# Patient Record
Sex: Male | Born: 1945 | Race: White | Hispanic: No | Marital: Married | Smoking: Former smoker
Health system: Southern US, Community
[De-identification: ages and names within clinical notes are randomized; demographics above are authoritative.]

## PROBLEM LIST (undated history)

## (undated) DIAGNOSIS — E785 Hyperlipidemia, unspecified: Secondary | ICD-10-CM

## (undated) DIAGNOSIS — K219 Gastro-esophageal reflux disease without esophagitis: Secondary | ICD-10-CM

## (undated) DIAGNOSIS — I10 Essential (primary) hypertension: Secondary | ICD-10-CM

## (undated) DIAGNOSIS — G4730 Sleep apnea, unspecified: Secondary | ICD-10-CM

## (undated) DIAGNOSIS — Z9989 Dependence on other enabling machines and devices: Secondary | ICD-10-CM

## (undated) DIAGNOSIS — I38 Endocarditis, valve unspecified: Secondary | ICD-10-CM

## (undated) DIAGNOSIS — I639 Cerebral infarction, unspecified: Secondary | ICD-10-CM

## (undated) DIAGNOSIS — Z9289 Personal history of other medical treatment: Secondary | ICD-10-CM

## (undated) DIAGNOSIS — I330 Acute and subacute infective endocarditis: Secondary | ICD-10-CM

## (undated) DIAGNOSIS — G4733 Obstructive sleep apnea (adult) (pediatric): Secondary | ICD-10-CM

## (undated) DIAGNOSIS — R011 Cardiac murmur, unspecified: Secondary | ICD-10-CM

## (undated) HISTORY — DX: Obstructive sleep apnea (adult) (pediatric): G47.33

## (undated) HISTORY — PX: INGUINAL HERNIA REPAIR: SUR1180

## (undated) HISTORY — PX: COLONOSCOPY: SHX174

## (undated) HISTORY — DX: Sleep apnea, unspecified: G47.30

## (undated) HISTORY — DX: Cardiac murmur, unspecified: R01.1

## (undated) HISTORY — DX: Hyperlipidemia, unspecified: E78.5

## (undated) HISTORY — DX: Acute and subacute infective endocarditis: I33.0

## (undated) HISTORY — DX: Cerebral infarction, unspecified: I63.9

## (undated) HISTORY — DX: Obstructive sleep apnea (adult) (pediatric): Z99.89

## (undated) HISTORY — DX: Gastro-esophageal reflux disease without esophagitis: K21.9

## (undated) HISTORY — PX: UPPER GASTROINTESTINAL ENDOSCOPY: SHX188

## (undated) HISTORY — DX: Endocarditis, valve unspecified: I38

## (undated) HISTORY — PX: HERNIA REPAIR: SHX51

## (undated) HISTORY — DX: Personal history of other medical treatment: Z92.89

## (undated) HISTORY — PX: POLYPECTOMY: SHX149

## (undated) HISTORY — DX: Essential (primary) hypertension: I10

---

## 2000-07-30 ENCOUNTER — Ambulatory Visit (HOSPITAL_COMMUNITY): Admission: RE | Admit: 2000-07-30 | Discharge: 2000-07-30 | Payer: Self-pay | Admitting: *Deleted

## 2000-07-30 ENCOUNTER — Encounter: Payer: Self-pay | Admitting: *Deleted

## 2002-01-27 ENCOUNTER — Ambulatory Visit (HOSPITAL_COMMUNITY): Admission: RE | Admit: 2002-01-27 | Discharge: 2002-01-27 | Payer: Self-pay | Admitting: Family Medicine

## 2002-10-13 ENCOUNTER — Ambulatory Visit (HOSPITAL_COMMUNITY): Admission: RE | Admit: 2002-10-13 | Discharge: 2002-10-13 | Payer: Self-pay | Admitting: *Deleted

## 2002-10-13 ENCOUNTER — Encounter (INDEPENDENT_AMBULATORY_CARE_PROVIDER_SITE_OTHER): Payer: Self-pay | Admitting: *Deleted

## 2004-04-04 ENCOUNTER — Encounter (INDEPENDENT_AMBULATORY_CARE_PROVIDER_SITE_OTHER): Payer: Self-pay | Admitting: *Deleted

## 2004-04-04 ENCOUNTER — Encounter (INDEPENDENT_AMBULATORY_CARE_PROVIDER_SITE_OTHER): Payer: Self-pay | Admitting: Specialist

## 2004-04-04 ENCOUNTER — Ambulatory Visit (HOSPITAL_COMMUNITY): Admission: RE | Admit: 2004-04-04 | Discharge: 2004-04-04 | Payer: Self-pay | Admitting: *Deleted

## 2006-06-11 ENCOUNTER — Ambulatory Visit (HOSPITAL_COMMUNITY): Admission: RE | Admit: 2006-06-11 | Discharge: 2006-06-11 | Payer: Self-pay | Admitting: *Deleted

## 2006-06-11 ENCOUNTER — Encounter (INDEPENDENT_AMBULATORY_CARE_PROVIDER_SITE_OTHER): Payer: Self-pay | Admitting: Specialist

## 2006-06-11 ENCOUNTER — Encounter (INDEPENDENT_AMBULATORY_CARE_PROVIDER_SITE_OTHER): Payer: Self-pay | Admitting: *Deleted

## 2008-03-24 ENCOUNTER — Encounter (INDEPENDENT_AMBULATORY_CARE_PROVIDER_SITE_OTHER): Payer: Self-pay | Admitting: *Deleted

## 2008-03-24 ENCOUNTER — Ambulatory Visit (HOSPITAL_COMMUNITY): Admission: RE | Admit: 2008-03-24 | Discharge: 2008-03-24 | Payer: Self-pay | Admitting: *Deleted

## 2009-01-09 HISTORY — PX: CARDIAC CATHETERIZATION: SHX172

## 2009-01-31 ENCOUNTER — Inpatient Hospital Stay (HOSPITAL_COMMUNITY): Admission: EM | Admit: 2009-01-31 | Discharge: 2009-02-08 | Payer: Self-pay | Admitting: Neurology

## 2009-01-31 ENCOUNTER — Encounter: Payer: Self-pay | Admitting: Emergency Medicine

## 2009-01-31 ENCOUNTER — Ambulatory Visit: Payer: Self-pay | Admitting: Internal Medicine

## 2009-01-31 ENCOUNTER — Encounter: Payer: Self-pay | Admitting: Internal Medicine

## 2009-01-31 ENCOUNTER — Ambulatory Visit: Payer: Self-pay | Admitting: Thoracic Surgery (Cardiothoracic Vascular Surgery)

## 2009-02-01 ENCOUNTER — Encounter (INDEPENDENT_AMBULATORY_CARE_PROVIDER_SITE_OTHER): Payer: Self-pay | Admitting: Neurology

## 2009-02-02 ENCOUNTER — Ambulatory Visit: Payer: Self-pay | Admitting: Internal Medicine

## 2009-02-02 ENCOUNTER — Encounter (INDEPENDENT_AMBULATORY_CARE_PROVIDER_SITE_OTHER): Payer: Self-pay | Admitting: Neurology

## 2009-02-06 ENCOUNTER — Encounter: Payer: Self-pay | Admitting: Thoracic Surgery (Cardiothoracic Vascular Surgery)

## 2009-02-13 ENCOUNTER — Encounter: Payer: Self-pay | Admitting: Internal Medicine

## 2009-02-13 ENCOUNTER — Telehealth: Payer: Self-pay | Admitting: Internal Medicine

## 2009-02-15 ENCOUNTER — Ambulatory Visit: Payer: Self-pay | Admitting: Internal Medicine

## 2009-02-15 DIAGNOSIS — K219 Gastro-esophageal reflux disease without esophagitis: Secondary | ICD-10-CM

## 2009-02-15 DIAGNOSIS — I6340 Cerebral infarction due to embolism of unspecified cerebral artery: Secondary | ICD-10-CM

## 2009-02-15 DIAGNOSIS — Z9189 Other specified personal risk factors, not elsewhere classified: Secondary | ICD-10-CM | POA: Insufficient documentation

## 2009-02-15 DIAGNOSIS — D649 Anemia, unspecified: Secondary | ICD-10-CM

## 2009-02-15 DIAGNOSIS — I10 Essential (primary) hypertension: Secondary | ICD-10-CM

## 2009-02-15 DIAGNOSIS — I330 Acute and subacute infective endocarditis: Secondary | ICD-10-CM

## 2009-02-15 DIAGNOSIS — E785 Hyperlipidemia, unspecified: Secondary | ICD-10-CM | POA: Insufficient documentation

## 2009-02-17 ENCOUNTER — Telehealth (INDEPENDENT_AMBULATORY_CARE_PROVIDER_SITE_OTHER): Payer: Self-pay | Admitting: *Deleted

## 2009-02-22 ENCOUNTER — Ambulatory Visit: Payer: Self-pay | Admitting: Internal Medicine

## 2009-02-23 ENCOUNTER — Encounter: Payer: Self-pay | Admitting: Internal Medicine

## 2009-02-27 ENCOUNTER — Encounter: Payer: Self-pay | Admitting: Internal Medicine

## 2009-03-02 ENCOUNTER — Ambulatory Visit (HOSPITAL_COMMUNITY): Admission: RE | Admit: 2009-03-02 | Discharge: 2009-03-02 | Payer: Self-pay | Admitting: *Deleted

## 2009-03-02 ENCOUNTER — Encounter (INDEPENDENT_AMBULATORY_CARE_PROVIDER_SITE_OTHER): Payer: Self-pay | Admitting: *Deleted

## 2009-03-13 ENCOUNTER — Ambulatory Visit: Payer: Self-pay | Admitting: Thoracic Surgery (Cardiothoracic Vascular Surgery)

## 2009-03-13 ENCOUNTER — Encounter: Payer: Self-pay | Admitting: Internal Medicine

## 2009-03-14 ENCOUNTER — Ambulatory Visit: Payer: Self-pay | Admitting: Internal Medicine

## 2009-03-17 ENCOUNTER — Encounter: Admission: RE | Admit: 2009-03-17 | Discharge: 2009-03-17 | Payer: Self-pay | Admitting: Neurology

## 2009-03-28 ENCOUNTER — Encounter: Payer: Self-pay | Admitting: Internal Medicine

## 2009-04-04 ENCOUNTER — Ambulatory Visit: Payer: Self-pay | Admitting: Internal Medicine

## 2009-04-11 HISTORY — PX: MITRAL VALVE REPAIR: SHX2039

## 2009-04-13 ENCOUNTER — Encounter: Payer: Self-pay | Admitting: Internal Medicine

## 2009-04-18 ENCOUNTER — Ambulatory Visit: Payer: Self-pay | Admitting: Internal Medicine

## 2009-04-19 ENCOUNTER — Encounter: Payer: Self-pay | Admitting: Internal Medicine

## 2009-05-08 ENCOUNTER — Ambulatory Visit (HOSPITAL_COMMUNITY)
Admission: RE | Admit: 2009-05-08 | Discharge: 2009-05-08 | Payer: Self-pay | Admitting: Thoracic Surgery (Cardiothoracic Vascular Surgery)

## 2009-05-09 ENCOUNTER — Ambulatory Visit: Payer: Self-pay | Admitting: Internal Medicine

## 2009-05-10 ENCOUNTER — Encounter: Payer: Self-pay | Admitting: Thoracic Surgery (Cardiothoracic Vascular Surgery)

## 2009-05-10 ENCOUNTER — Inpatient Hospital Stay (HOSPITAL_COMMUNITY)
Admission: RE | Admit: 2009-05-10 | Discharge: 2009-05-16 | Payer: Self-pay | Admitting: Thoracic Surgery (Cardiothoracic Vascular Surgery)

## 2009-05-10 ENCOUNTER — Ambulatory Visit: Payer: Self-pay | Admitting: Thoracic Surgery (Cardiothoracic Vascular Surgery)

## 2009-06-01 ENCOUNTER — Ambulatory Visit: Payer: Self-pay | Admitting: Thoracic Surgery (Cardiothoracic Vascular Surgery)

## 2009-06-01 ENCOUNTER — Encounter: Payer: Self-pay | Admitting: Internal Medicine

## 2009-06-01 ENCOUNTER — Encounter
Admission: RE | Admit: 2009-06-01 | Discharge: 2009-06-01 | Payer: Self-pay | Admitting: Thoracic Surgery (Cardiothoracic Vascular Surgery)

## 2009-06-08 ENCOUNTER — Encounter (HOSPITAL_COMMUNITY): Admission: RE | Admit: 2009-06-08 | Discharge: 2009-09-06 | Payer: Self-pay | Admitting: Cardiology

## 2009-07-05 ENCOUNTER — Ambulatory Visit (HOSPITAL_BASED_OUTPATIENT_CLINIC_OR_DEPARTMENT_OTHER)
Admission: RE | Admit: 2009-07-05 | Discharge: 2009-07-05 | Payer: Self-pay | Admitting: Thoracic Surgery (Cardiothoracic Vascular Surgery)

## 2009-07-08 ENCOUNTER — Ambulatory Visit: Payer: Self-pay | Admitting: Internal Medicine

## 2009-08-01 ENCOUNTER — Ambulatory Visit (HOSPITAL_BASED_OUTPATIENT_CLINIC_OR_DEPARTMENT_OTHER)
Admission: RE | Admit: 2009-08-01 | Discharge: 2009-08-01 | Payer: Self-pay | Admitting: Thoracic Surgery (Cardiothoracic Vascular Surgery)

## 2009-08-05 ENCOUNTER — Ambulatory Visit: Payer: Self-pay | Admitting: Internal Medicine

## 2009-09-15 ENCOUNTER — Encounter: Payer: Self-pay | Admitting: Internal Medicine

## 2009-12-18 ENCOUNTER — Ambulatory Visit: Payer: Self-pay | Admitting: Thoracic Surgery (Cardiothoracic Vascular Surgery)

## 2009-12-18 ENCOUNTER — Encounter: Payer: Self-pay | Admitting: Internal Medicine

## 2010-02-19 ENCOUNTER — Encounter: Admission: RE | Admit: 2010-02-19 | Discharge: 2010-02-19 | Payer: Self-pay | Admitting: Surgery

## 2010-03-08 IMAGING — CT CT HEAD W/O CM
1 series · 16 of 30 positions shown, 20 images · non-contrast
Comparison: None available.

CLINICAL DATA: Headache and weakness.

CT HEAD WITHOUT CONTRAST
TECHNIQUE: Contiguous axial images were obtained from the base of
the skull through the vertex without contrast.

[Series 2: head_seq 4.5 h37s st · axial · 0.43mm/px · z∈[-156,-12]mm · 16 of 36 slices shown, 20 images]
[im 2/36  brain]
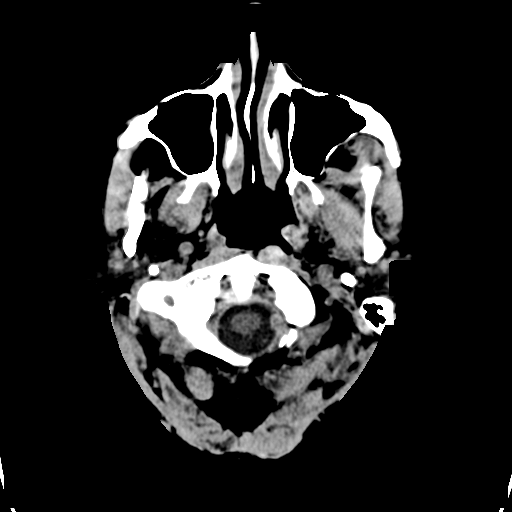
[im 2/36  bone]
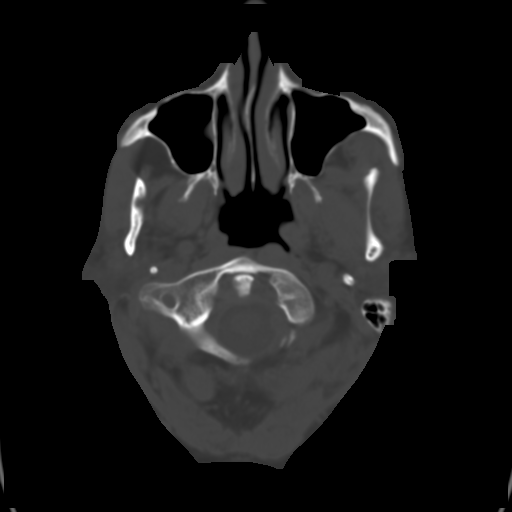
[im 4/36  brain]
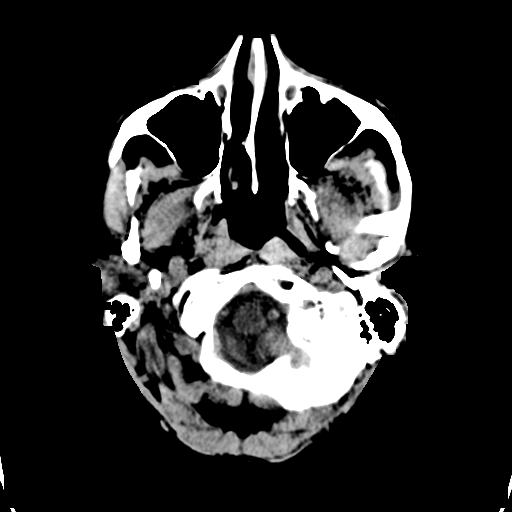
[im 7/36  brain]
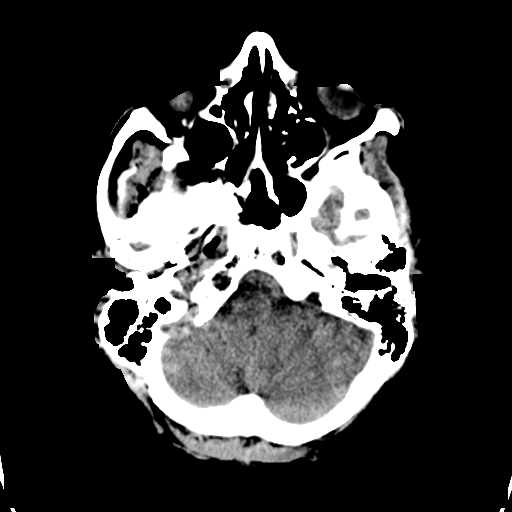
[im 9/36  brain]
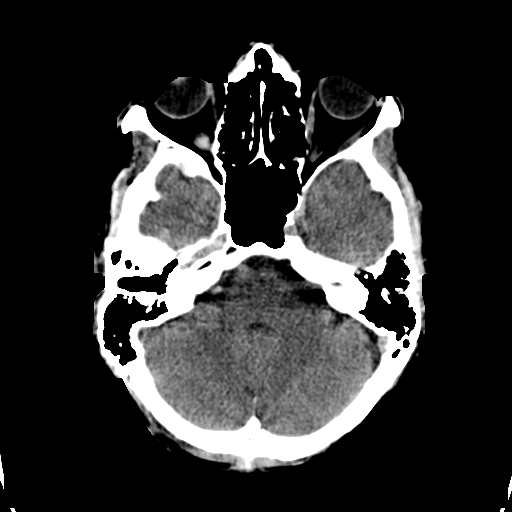
[im 10/36  brain]
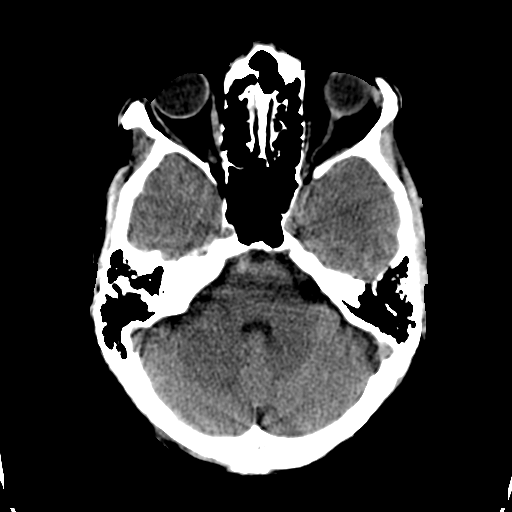
[im 10/36  bone]
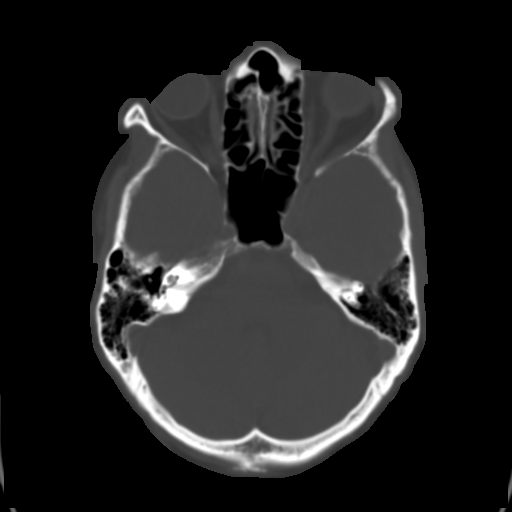
[im 13/36  brain]
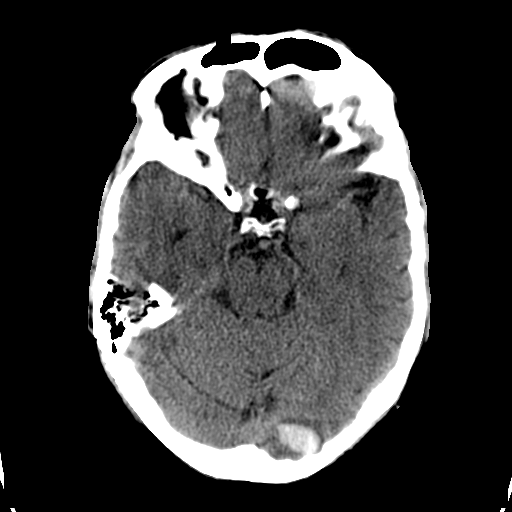
[im 15/36  brain]
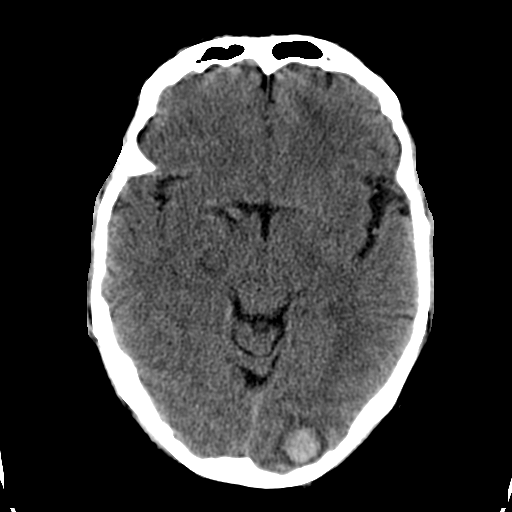
[im 17/36  brain]
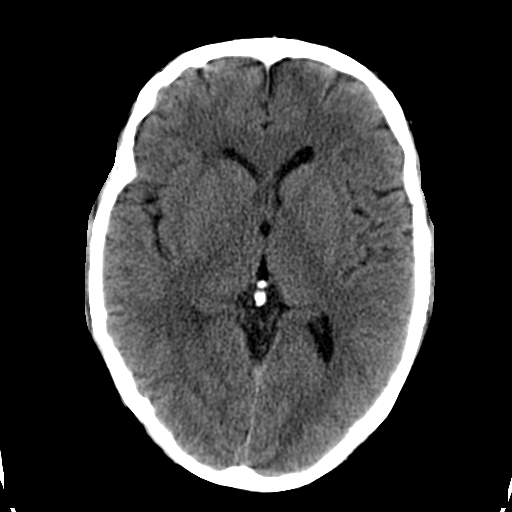
[im 19/36  brain]
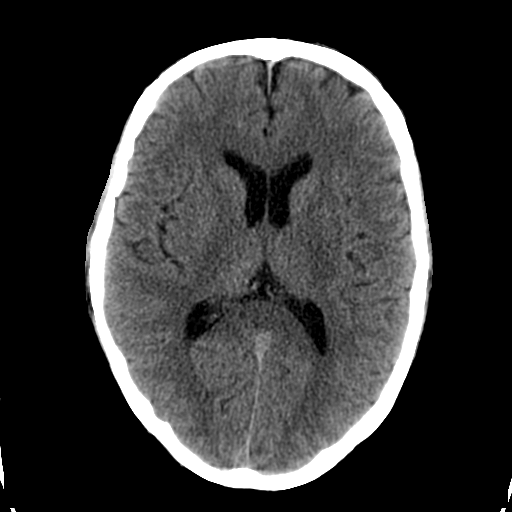
[im 19/36  bone]
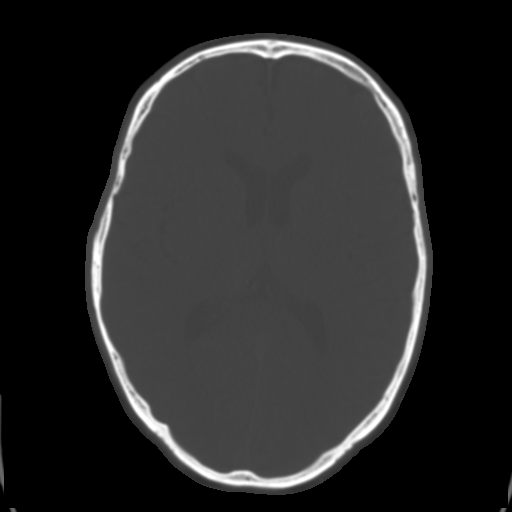
[im 21/36  brain]
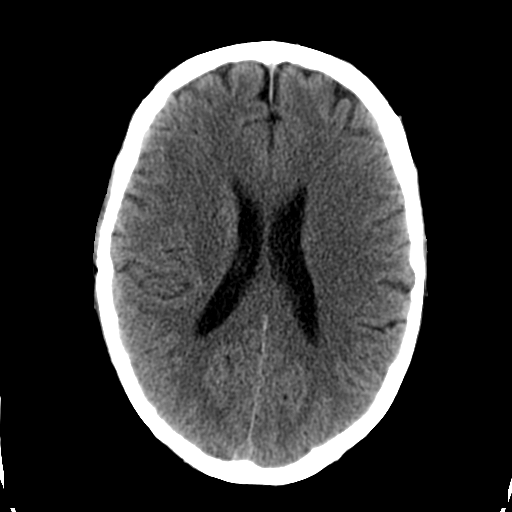
[im 23/36  brain]
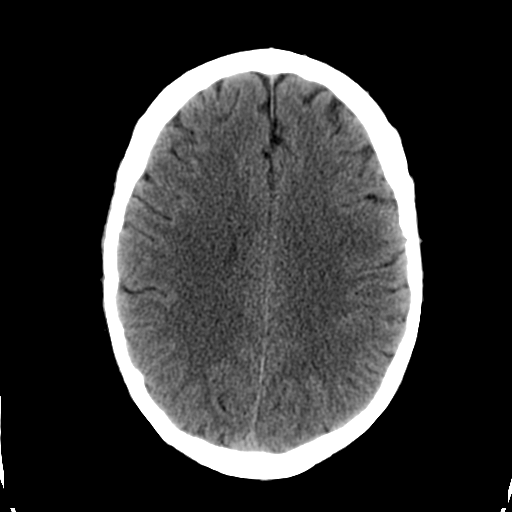
[im 26/36  brain]
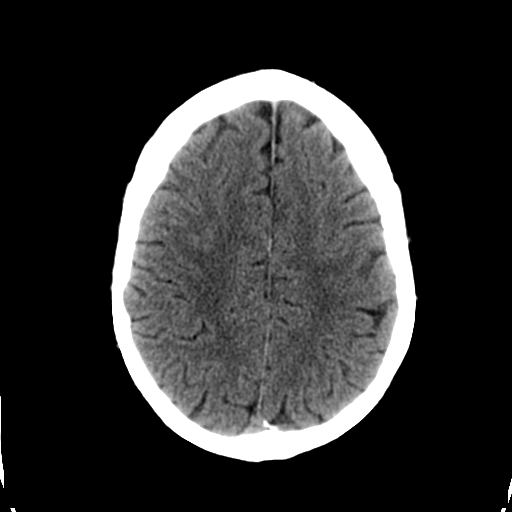
[im 27/36  brain]
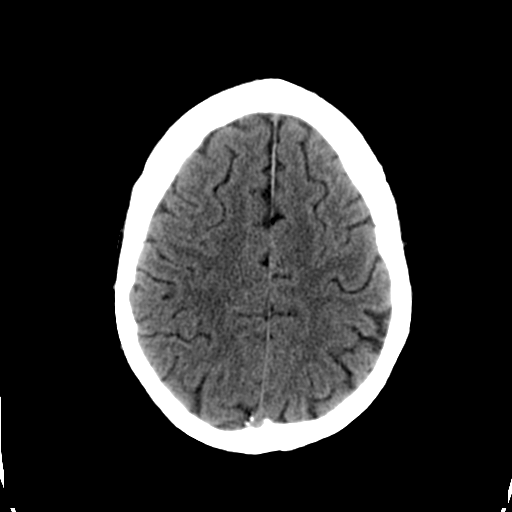
[im 27/36  bone]
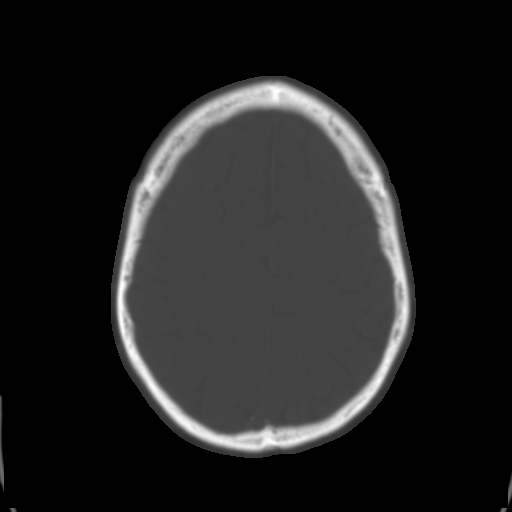
[im 29/36  brain]
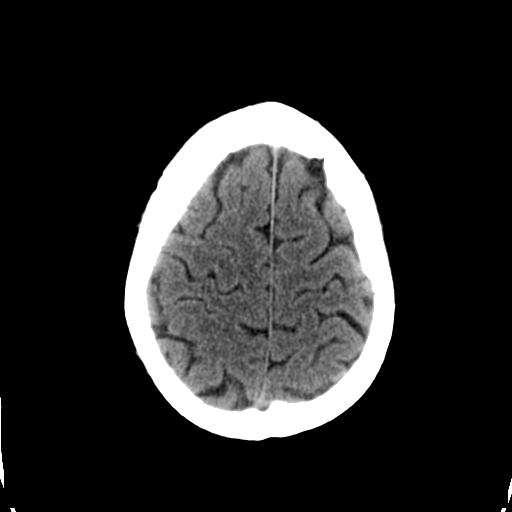
[im 32/36  brain]
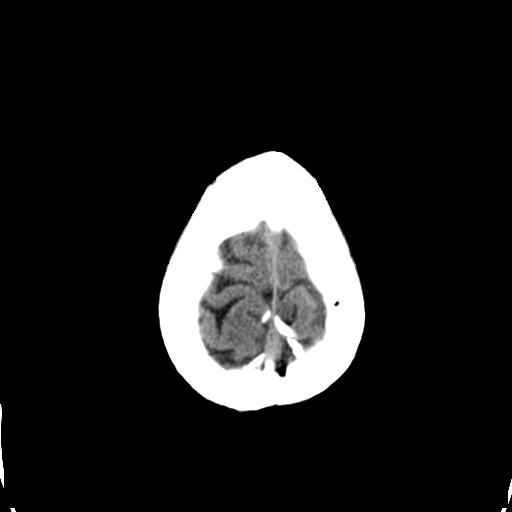
[im 34/36  brain]
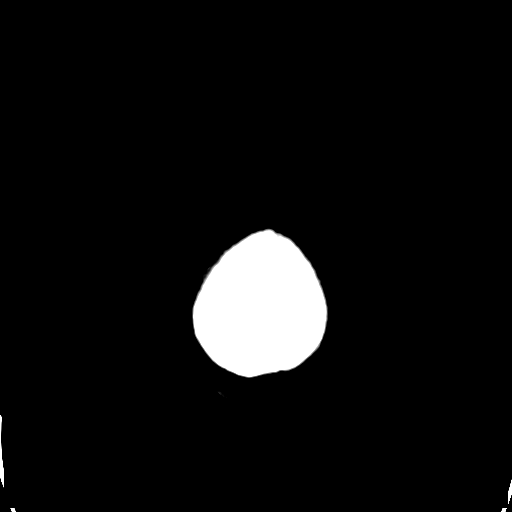

[16 of 30 positions shown; findings below may reference images not displayed]

FINDINGS: There is a focal, round hemorrhage in the posterior
aspect of the left parieto-occipital  lobe measuring 2.1 cm in
diameter.  The brain otherwise appears normal without extra-axial
fluid collection, midline shift or interventricular hemorrhage.
Calvarium is intact.  Imaged paranasal sinuses and mastoid air
cells are clear.
IMPRESSION: Focal hemorrhage in the left parieto-occipital lobe.  Etiology is
indeterminate.  Lesion may represent a hemorrhagic metastasis.

## 2010-03-09 IMAGING — CT CT HEAD W/O CM
1 of 2 series · 13 of 30 positions shown, 17 images · non-contrast
Comparison: the previous day's study

CLINICAL DATA: Intracranial hemorrhage

CT HEAD WITHOUT CONTRAST
TECHNIQUE: Contiguous axial images were obtained from the base of
the skull through the vertex without contrast.

[Series 2: brain · axial · 0.49mm/px · z∈[+143,+283]mm · 13 of 32 slices shown, 17 images]
[im 3/32  brain]
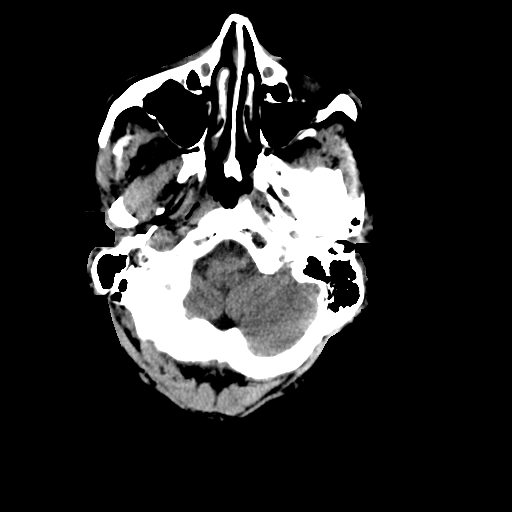
[im 3/32  bone]
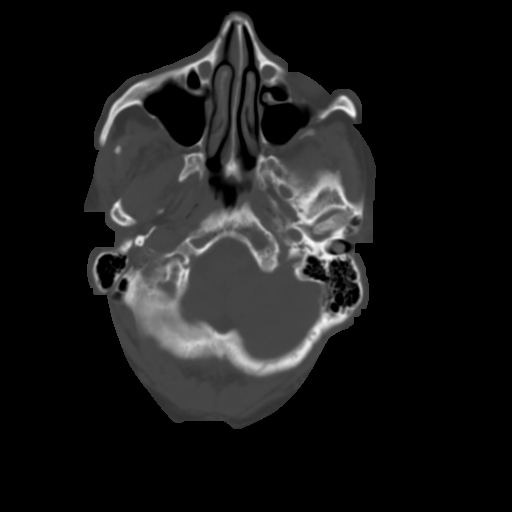
[im 5/32  brain]
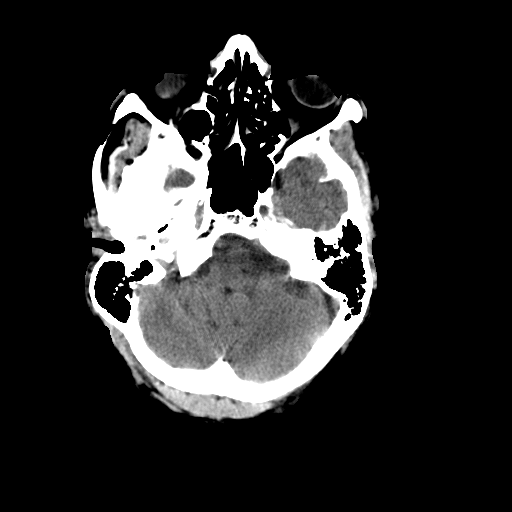
[im 7/32  brain]
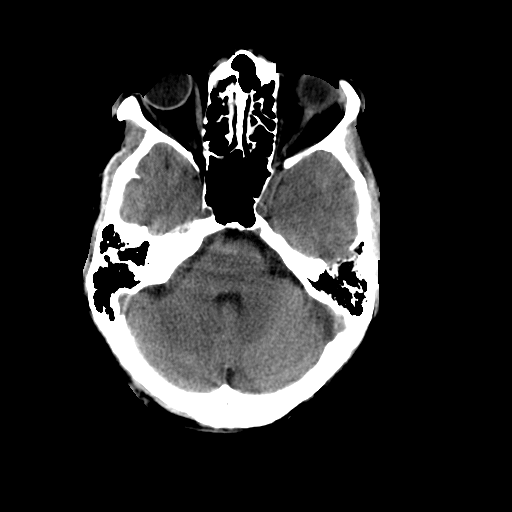
[im 9/32  brain]
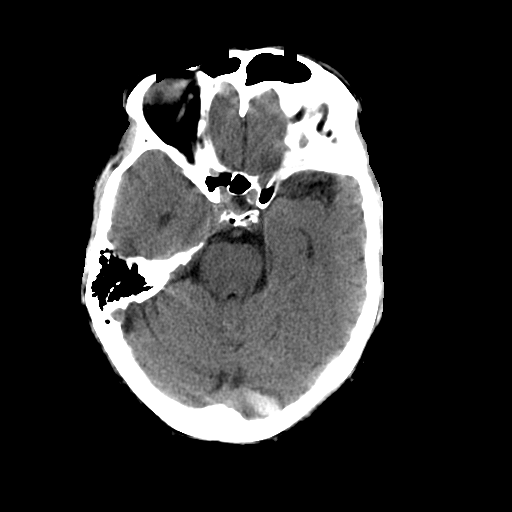
[im 12/32  brain]
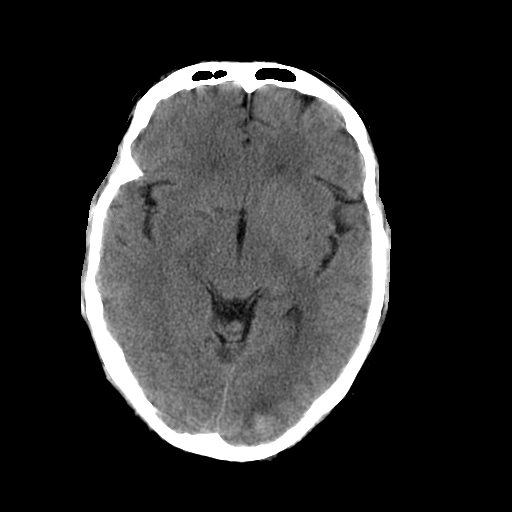
[im 12/32  bone]
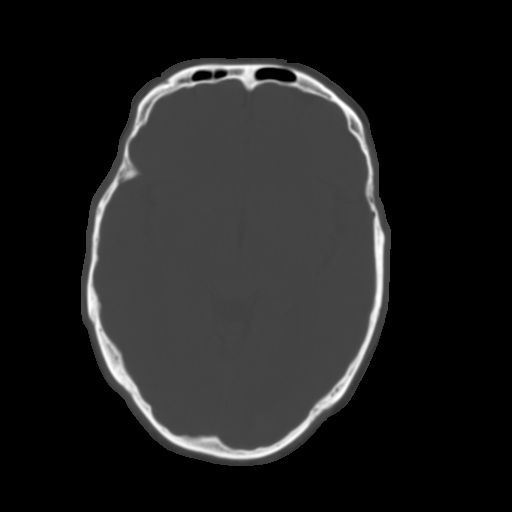
[im 14/32  brain]
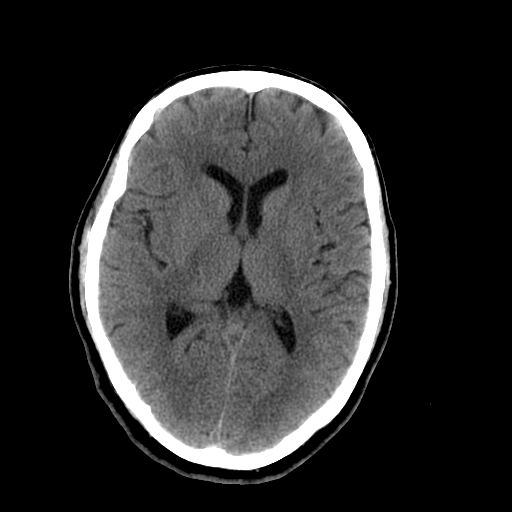
[im 16/32  brain]
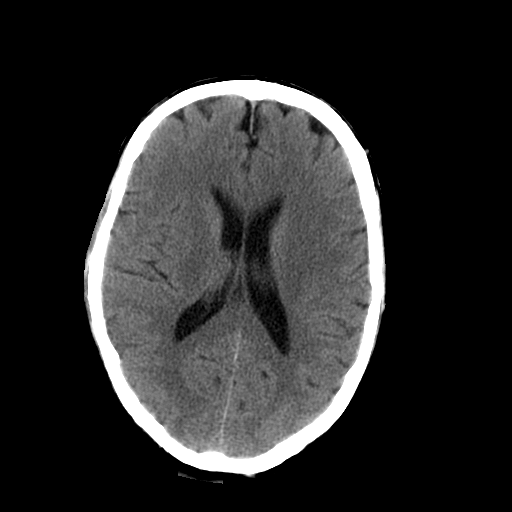
[im 18/32  brain]
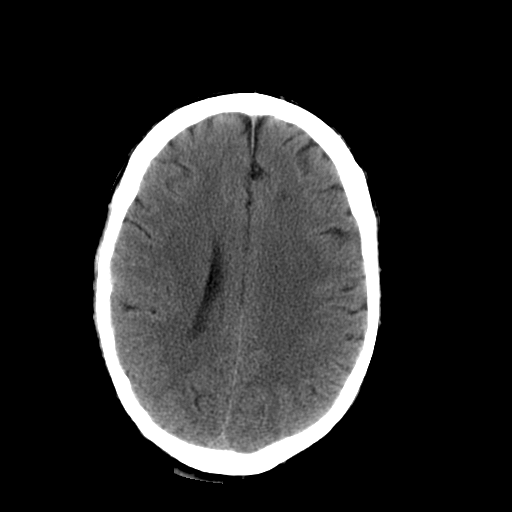
[im 20/32  brain]
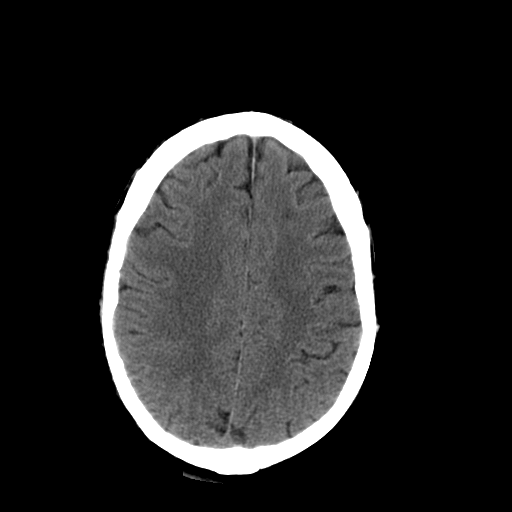
[im 20/32  bone]
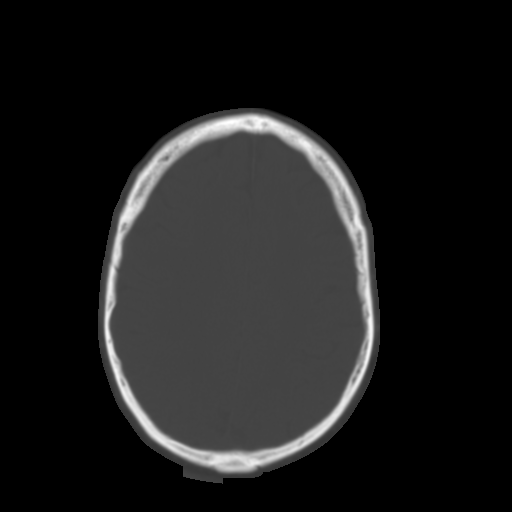
[im 23/32  brain]
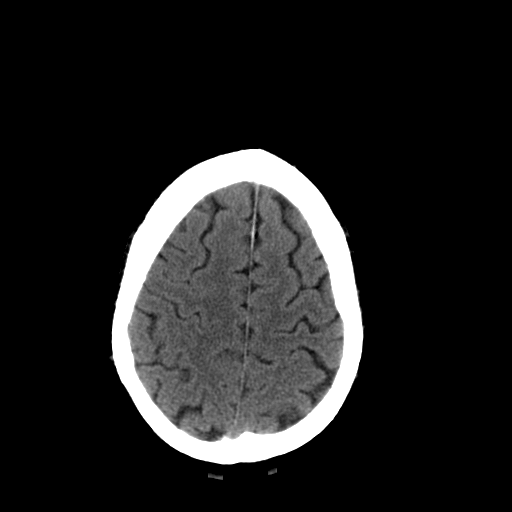
[im 25/32  brain]
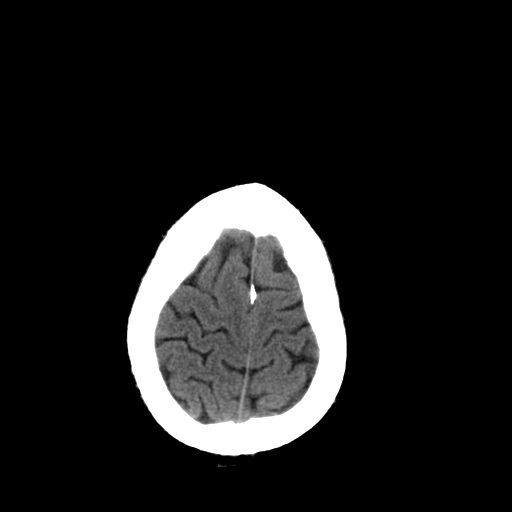
[im 27/32  brain]
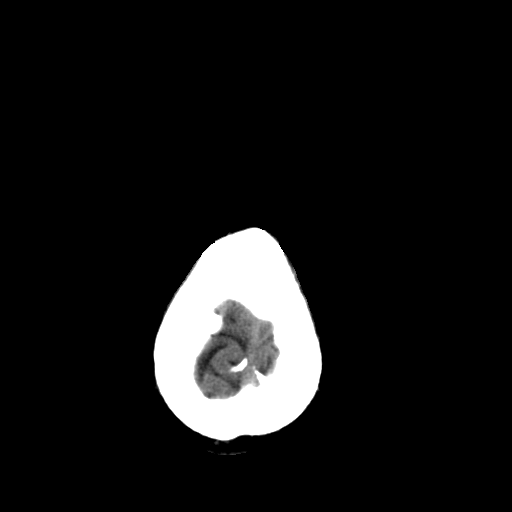
[im 29/32  brain]
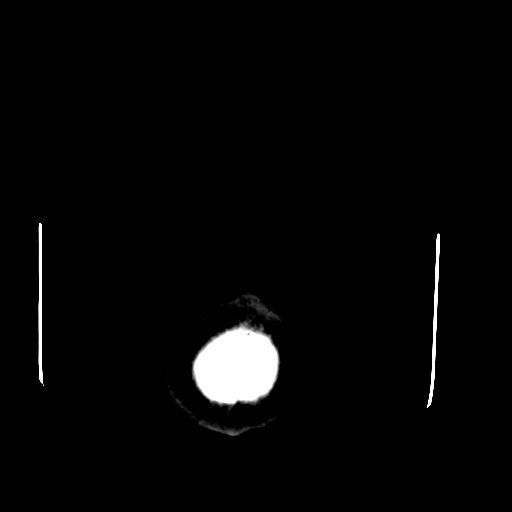
[im 29/32  bone]
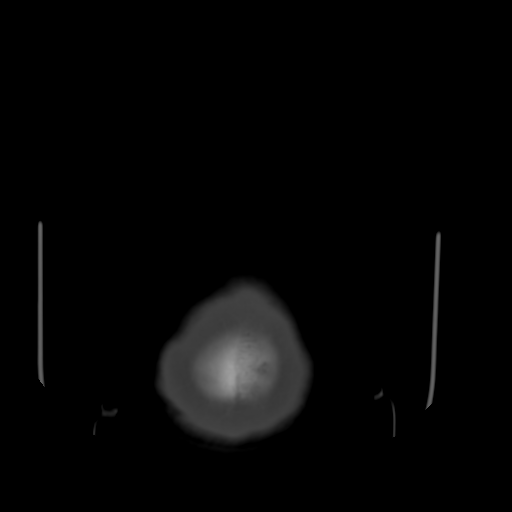

[13 of 30 positions shown; findings below may reference images not displayed]

FINDINGS: 15 mm left occipital focal intraparenchymal hemorrhage
measures slightly smaller than on prior study.  There is a rim of
surrounding parenchymal edema.  Minimal mass effect.  No new
intracranial hemorrhage.  Ventricles and sulci remain normal in
size and symmetry.  Negative for midline shift.  Acute infarct may
be inapparent on noncontrast CT.  Bone windows show no focal
lesion.
IMPRESSION: 1.  Slight interval decrease in size of focal left occipital
intraparenchymal hemorrhage, without acute or superimposed
abnormality.

## 2010-03-14 IMAGING — CR DG CHEST 1V PORT
1 series · 1 of 1 positions shown · non-contrast
Comparison: 01/31/2009

CLINICAL DATA: PICC line placement

PORTABLE CHEST - 1 VIEW - 9916 hours:

[view not recorded]
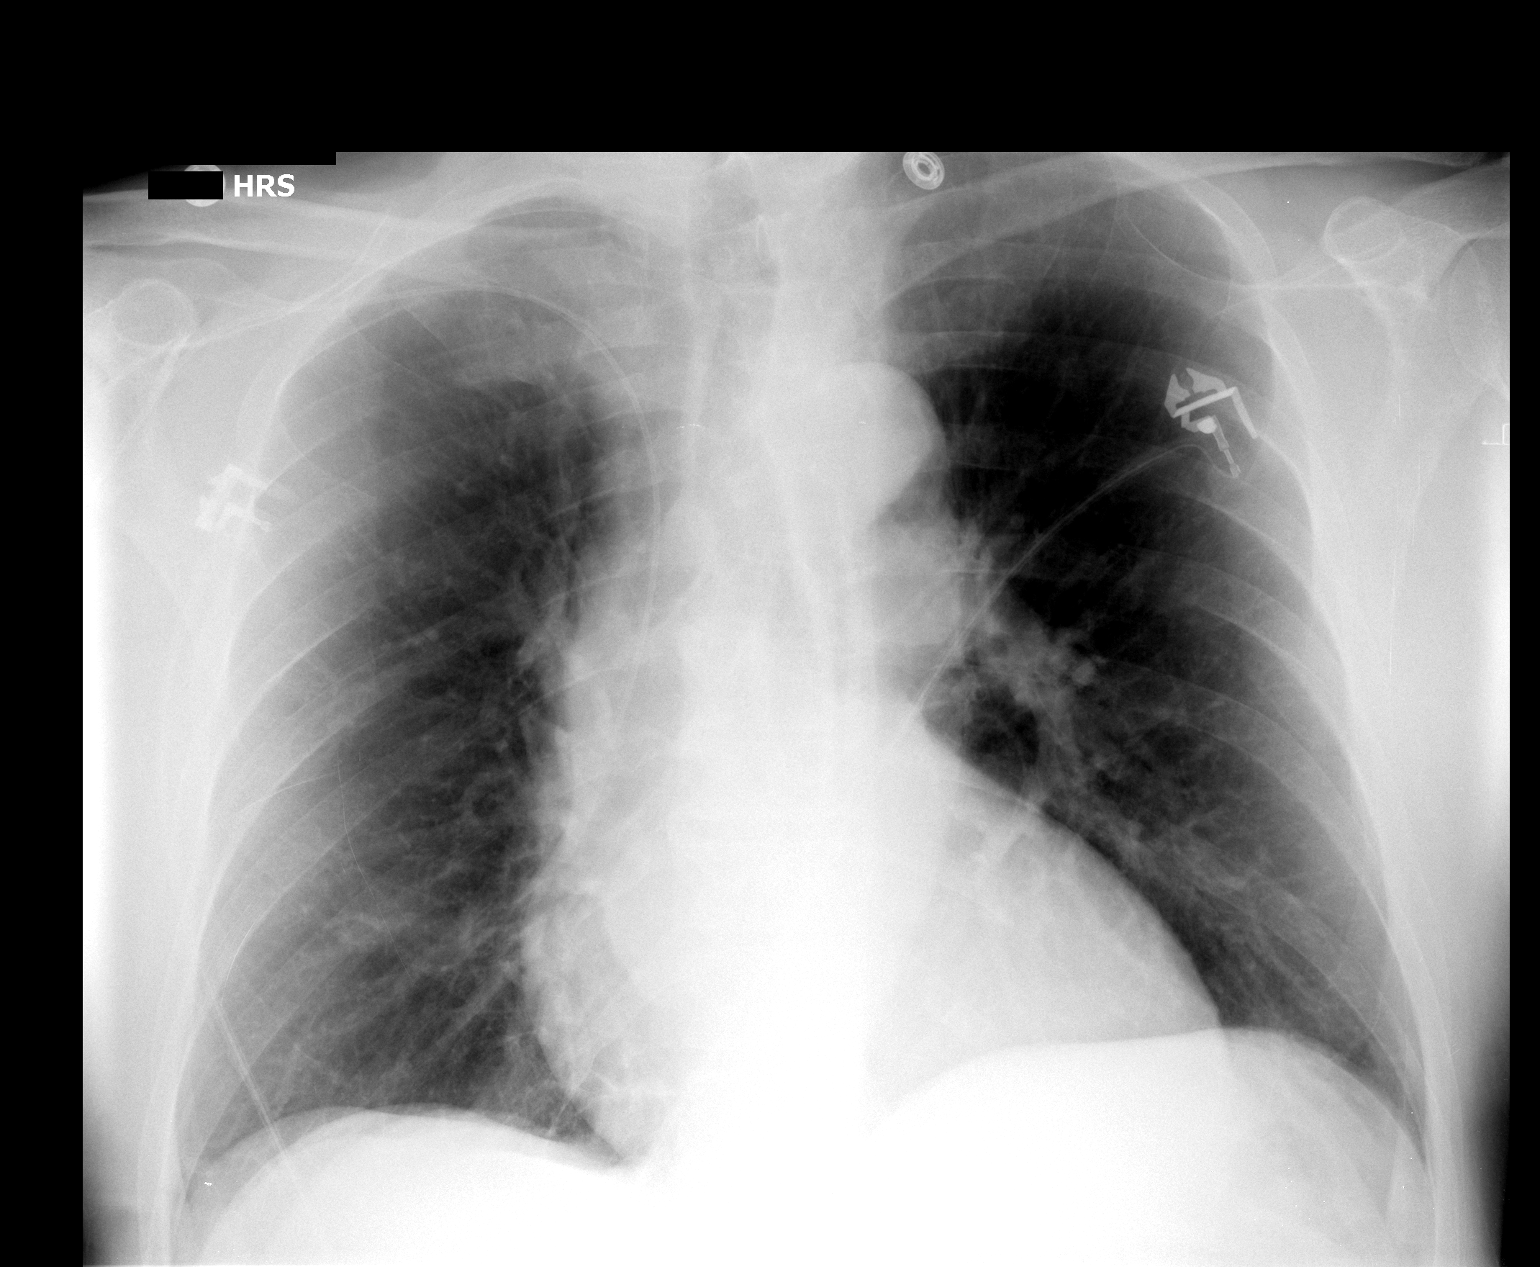

[1 of 1 positions shown; findings below may reference images not displayed]

FINDINGS: PICC line via right upper extremity approach is noted
with tip in the mid to lower SVC. Lungs clear.  Heart size normal.
IMPRESSION: Specifically, the PICC  is in the mid to lower SVC.

## 2010-09-04 ENCOUNTER — Encounter (INDEPENDENT_AMBULATORY_CARE_PROVIDER_SITE_OTHER): Payer: Self-pay | Admitting: *Deleted

## 2010-10-16 ENCOUNTER — Ambulatory Visit: Payer: Self-pay | Admitting: Gastroenterology

## 2010-11-22 DIAGNOSIS — Z9289 Personal history of other medical treatment: Secondary | ICD-10-CM

## 2010-11-22 HISTORY — DX: Personal history of other medical treatment: Z92.89

## 2010-12-11 NOTE — Procedures (Signed)
Summary: EGD  NAME:  Edgar Jones, Edgar Jones                 ACCOUNT NO.:  [id number]   MEDICAL RECORD NO.:  [id number]          PATIENT TYPE:  AMB   LOCATION:  ENDO                         FACILITY:  [facility]   PHYSICIAN:  Georgiana Spinner, M.D.    DATE OF BIRTH:  07-29-1946   DATE OF PROCEDURE:  06/11/2006  DATE OF DISCHARGE:                                 OPERATIVE REPORT   PROCEDURE:  Upper endoscopy with dilation and biopsy.   INDICATIONS:  Dysphagia.   ANESTHESIA:  Demerol 40 mg, Versed 5 mg.   PROCEDURE:  With the patient mildly sedated in the left lateral decubitus  position, the Olympus videoscopic endoscope was inserted and passed under  direct vision through the esophagus which appeared normal until we reached  distal esophagus and there was an area of Barrett's esophagus and a fairly  tight esophageal stricture that was photographed.  We then entered into the  stomach fundus, body, antrum, duodenal bulb, second portion duodenum were  visualized.  From this point the endoscope was slowly withdrawn taking  circumferential views of duodenal mucosa until the endoscope had been pulled  back into the stomach placed in retroflexion to view the stomach from below.  The endoscope was then straightened and a guidewire was passed. The  endoscope was withdrawn.  Subsequently Savary dilators 15, 16 and 17 were  passed under fluoroscopic guidance rather easily.  With the latter, I  elected to withdraw the guidewire with the dilator and we then reinserted  the endoscope and it was noted that we had fractured the stricture as  planned.  We then took biopsies from the areas of what appeared to be  Barrett's esophagus and withdrew the endoscope.  The patient's vital signs,  pulse oximeter remained stable.  The patient tolerated procedure well  without apparent complications.   FINDINGS:  Barrett's esophagus with a esophageal stricture dilated, await  clinical response and biopsy reports.  The patient  will call me for results  and follow-up with me as an outpatient.           ______________________________  Georgiana Spinner, M.D.     GMO/MEDQ  D:  06/11/2006  T:  06/11/2006  Job:  [id number]

## 2010-12-11 NOTE — Procedures (Signed)
Summary: Colon  NAME:  Edgar Jones, Edgar Jones                 ACCOUNT NO.:  [id number]      MEDICAL RECORD NO.:  [id number]          PATIENT TYPE:  AMB      LOCATION:  ENDO                         FACILITY:  [facility]      PHYSICIAN:  Georgiana Spinner, M.D.    DATE OF BIRTH:  [birth year]      DATE OF PROCEDURE:   DATE OF DISCHARGE:                                  OPERATIVE REPORT      PROCEDURE:  Colonoscopy.      INDICATIONS:  Colon polyps.  Rule out colon cancer because of strep   bovis bacteremia.      ANESTHESIA:  Fentanyl 125 mcg, Versed 12 mg.      PROCEDURE:  With the patient mildly sedated in the left lateral   decubitus position, a rectal exam was performed which was unremarkable   to my examination.  Subsequently, the Pentax videoscopic pediatric   colonoscope was inserted in the rectum and passed through a very   tortuous rectosigmoid and sigmoid colon area with pressure applied and   the patient rolled to his back and subsequently to his right side, we   were able to reach the cecum identified by ileocecal valve and   appendiceal orifice, both of which were photographed.  One of the loops   of the ileocecal valve was prominent so I elected to biopsy it and from   this point, the colonoscope was then slowly withdrawn taking   circumferential views of colonic mucosa, stopping in the transverse   colon where a polyp was seen, photographed and removed using hot biopsy   forceps technique at setting of 20/150 blended current.  We next stopped   in the rectum which appeared normal on direct and showed hemorrhoids on   retroflexed view.  The endoscope was straightened and withdrawn.  The   patient's vital signs and pulse oximeter remained stable.  The patient   tolerated the procedure well without apparent complications.      FINDINGS:   1. Polyp of transverse colon.   2. Internal hemorrhoids.   3. Fullness of one of the valves of the ileocecal valve, which may be       normal variant.   Await biopsy reports.  The patient will call me       for results and follow-up with me as an outpatient.                  ______________________________   Georgiana Spinner, M.D.            GMO/MEDQ  D:  03/02/2009  T:  03/02/2009  Job:  [id number]      cc:   Cliffton Asters, M.D.   Fax: [phone]

## 2010-12-11 NOTE — Consult Note (Signed)
Summary: [facility]  [facility]   Imported By: Florinda Marker 01/30/2010 15:54:57  _____________________________________________________________________  External Attachment:    Type:   Image     Comment:   External Document

## 2010-12-11 NOTE — Procedures (Signed)
Summary: EGD  NAME:  Edgar Jones, Edgar Jones                 ACCOUNT NO.:  [id number]      MEDICAL RECORD NO.:  [id number]          PATIENT TYPE:  AMB      LOCATION:  ENDO                         [facility]ACILITY:  [facility]      PHYSICIAN:  Georgiana Spinner, M.D.    DATE OF BIRTH:  10/23/1946      DATE OF PROCEDURE:  03/24/2008   DATE OF DISCHARGE:                                  OPERATIVE REPORT      PROCEDURE:  Upper endoscopy.      INDICATIONS:  GERD.      ANESTHESIA:  Fentanyl 75 mcg, Versed 6 mg.      PROCEDURE:  With the patient mildly sedated in the left lateral   decubitus position, the Pentax videoscopic endoscope was inserted in the   mouth, passed under direct vision through the esophagus which appeared   normal.  There may have been one small area of Barrett's I could see,   but the area was somewhat edematous so I could not get the   squamocolumnar junction to open in plain view.  Biopsy was taken of the   areas of the suspected Barrett's.  We entered into the stomach.  The   fundus, body, antrum, duodenal bulb, second portion duodenum all   appeared normal.  From this point the endoscope was slowly withdrawn   taking circumferential views of duodenal mucosa until the endoscope had   been pulled back into the stomach, placed on retroflexion to view the   stomach from below.  The endoscope was straightened and withdrawn taking   circumferential views of remaining gastric and esophageal mucosa.  The   patient's vital signs, pulse oximeter remained stable.  The patient   tolerated procedure well without apparent complications.      FINDINGS:  Loose wrap of the GE junction around the endoscope indicating   laxity of the lower esophageal sphincter, question of Barrett's   esophagus, not clearly seen at this point, await biopsy report.  The   patient will call me for results and follow-up with me as an outpatient.   Proceed to colonoscopy.                  ______________________________   Georgiana Spinner, M.D.            GMO/MEDQ  D:  03/24/2008  T:  03/24/2008  Job:  [id number]      cc:   Marjory Lies, M.D.   Fax: [phone]

## 2010-12-11 NOTE — Procedures (Signed)
Summary: EGD  NAME:  Edgar Jones, Edgar Jones                           ACCOUNT NO.:  [id number]   MEDICAL RECORD NO.:  [id number]                   PATIENT TYPE:  AMB   LOCATION:  ENDO                                 FACILITY:  [facility]   PHYSICIAN:  Georgiana Spinner, M.D.                 DATE OF BIRTH:  [birth year]   DATE OF PROCEDURE:  04/04/2004  DATE OF DISCHARGE:                                 OPERATIVE REPORT   PROCEDURE:  Upper endoscopy with biopsy.   INDICATIONS FOR PROCEDURE:  Gastroesophageal reflux disease.   ANESTHESIA:  Demerol 40, Versed 4 mg.   DESCRIPTION OF PROCEDURE:  With the patient mildly sedated in the left  lateral decubitus position, the Olympus videoscopic endoscope was inserted  in the mouth and passed under direct vision through the esophagus which  appeared normal until it reached the distal esophagus and there was the  beginning of a recurrence of a stricture that we had previously dilated. We  advanced slightly pass this and there was two areas of Barrett's esophagus  seen, one isthmus of tissue on the distal wall, a small island of Barrett's  on the proximal wall of the esophagus.  A photograph was taken and biopsies  of each area were taken.  We entered into the stomach.  The fundus, body,  antrum, duodenal bulb and second portion of the duodenum were visualized  which appeared normal.  From this point, the endoscope was slowly withdrawn  taking circumferential views of the duodenal mucosa until the endoscope was  then pulled back in the stomach, placed in retroflexion to view the stomach  from below. The endoscope was then straightened and withdrawn taking  circumferential views of the remaining gastric and esophageal mucosa  stopping in the mid esophagus where a papilloma was seen and removed with  biopsy forceps.  The endoscope was then withdrawn.  The patient's vital  signs and pulse oximeter remained stable. The patient tolerated the  procedure well without  apparent complications.   FINDINGS:  Early recurrence of stricture of the distal esophagus. Changes of  Barrett's esophagus biopsied. 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   PLAN:  Await biopsy report and clinical response. The patient will call me  for results and followup with me as an outpatient.   Of note, the patient states that his dysphagia is much better since dilation  a year ago.                                               Georgiana Spinner, M.D.    GMO/MEDQ  D:  04/04/2004  T:  04/04/2004  Job:  [id number]   cc:   Marjory Lies, M.D.  [address]  [place]  [place] [address]  Fax: [phone]

## 2010-12-11 NOTE — Letter (Signed)
Summary: New Patient letter  [facility]  [address] [place], [place] [address]   Phone: [phone]  Fax: [phone]       09/04/2010 MRN: [id number]  Edgar Jones [address] [place], [place]  [address]  Dear Mr. Jules Schick,  Welcome to the Gastroenterology Division at [facility].    You are scheduled to see Dr.  Christella Hartigan on 10-16-10 at 10:00a.m. on the 3rd floor at [facility], [address]  We ask that you try to arrive at our office 15 minutes prior to your appointment time to allow for check-in.  We would like you to complete the enclosed self-administered evaluation form prior to your visit and bring it with you on the day of your appointment.  We will review it with you.  Also, please bring a complete list of all your medications or, if you prefer, bring the medication bottles and we will list them.  Please bring your insurance card so that we may make a copy of it.  If your insurance requires a referral to see a specialist, please bring your referral form from your primary care physician.  Co-payments are due at the time of your visit and may be paid by cash, check or credit card.     Your office visit will consist of a consult with your physician (includes a physical exam), any laboratory testing he/she may order, scheduling of any necessary diagnostic testing (e.g. x-ray, ultrasound, CT-scan), and scheduling of a procedure (e.g. Endoscopy, Colonoscopy) if required.  Please allow enough time on your schedule to allow for any/all of these possibilities.    If you cannot keep your appointment, please call [phone] to cancel or reschedule prior to your appointment date.  This allows [place] the opportunity to schedule an appointment for another patient in need of care.  If you do not cancel or reschedule by 5 p.m. the business day prior to your appointment date, you will be charged a $50.00 late cancellation/no-show fee.    Thank you for choosing [facility] for your medical needs.  We appreciate the opportunity to care for you.  Please visit [place] at our website  to learn more about our practice.                     Sincerely,                                                             The Gastroenterology Division

## 2010-12-11 NOTE — Procedures (Signed)
Summary: EGD    NAME:  Edgar Jones, Edgar Jones                           ACCOUNT NO.:  [id number]   MEDICAL RECORD NO.:  [id number]                   PATIENT TYPE:  AMB   LOCATION:  ENDO                                 FACILITY:  [facility]   PHYSICIAN:  Georgiana Spinner, M.D.                 DATE OF BIRTH:  Sep 24, 1946   DATE OF PROCEDURE:  DATE OF DISCHARGE:                                 OPERATIVE REPORT   PROCEDURE:  Upper endoscopy.   INDICATIONS FOR PROCEDURE:  Dysphagia.   ANESTHESIA:  Demerol 50, Versed 5 mg.   DESCRIPTION OF PROCEDURE:  With the patient mildly sedated in the left  lateral decubitus position, the Olympus videoscopic endoscope was inserted  in the mouth and passed under direct vision through the esophagus which  showed changes of esophagitis and diffuse inflammatory process. We reached  the distal esophagus and there were some changes there above a stricture  which was photographed. We entered into the stomach. The fundus, body,  antrum, duodenal bulb, and second portion of the duodenum were visualized.  From this point, the endoscope was slowly withdrawn taking circumferential  views of the duodenal mucosa until the endoscope was then pulled back into  the stomach, placed in retroflexion to view the stomach from below. The  endoscope was then straightened and a guidewire was passed, endoscope  removed and over the guidewire was passed 15, 17 and 18 Savary dilators  easily. With the latter, the guidewire was removed. The endoscope was  reinserted, a small amount of bleeding was seen as to be expected with  dilation of this stricture but the area of the distal esophagus whether it  ws possibly esophagitis versus Barrett's could not be well visualized  because of the blood staining; therefore, the endoscope was withdrawn. The  patient's vital signs and pulse oximeter remained stable. The patient  tolerated the procedure well without apparent complications.   FINDINGS:   Changes of esophagitis and/or Barrett's esophagus with a  stricture dilated to 15, 17 and 18 dilation.   PLAN:  Await clinical response. The patient will follow-up with me as an  outpatient and proceed to colonoscopy as planned.                                                 Georgiana Spinner, M.D.    GMO/MEDQ  D:  10/13/2002  T:  10/13/2002  Job:  [id number]   cc:   Gloriajean Dell. Andrey Campanile, M.D.

## 2010-12-11 NOTE — Assessment & Plan Note (Signed)
History of Present Illness Visit Type: new patient  Primary GI MD: Rob Bunting MD Primary Provider: Delorse Lek, MD  Requesting Provider: na Chief Complaint: Barrett's esophagus History of Present Illness:     very pleasant 65 year old man who has had Barrett's esophagus changes in the past.   he has no GERD symptoms, but takes nexium daily.  He still has intermittent dysphagia, but drinks a lot of water and drinks slowly and this prevents it.  Overall stable weight.  No overt GI bleeding.  I have reviewed his most recent endoscopies and colonoscopies by Dr. Virginia Rochester. In 2005 he had no Barrett's changes on pathology, in 2007 he had intestinal metaplasia without dysplasia on biopsy, in 2009 he had no Barrett's changes on pathology. He was described to have a wrap at GE junction however the patient does not recall ever having a fundoplication performed. Benign-appearing esophageal stricture has been dilated in the past. In April 2010 he underwent a colonoscopy and this found a hyperplastic polyp as well as hemorrhoids.             Current Medications (verified): 1)  Nexium 40 Mg Cpdr (Esomeprazole Magnesium) .Marland Kitchen.. 1 Daily 2)  Enalapril Maleate 10 Mg Tabs (Enalapril Maleate) .Marland Kitchen.. 1 Daily 3)  Simvastatin 40 Mg Tabs (Simvastatin) .Marland Kitchen.. 1 Daily  Allergies (verified): No Known Drug Allergies  Past History:  Past Medical History: Anemia-NOS GERD Hyperlipidemia Hypertension Barrett's esophagus without dysplasia  Past Surgical History: valvular heart surgery 2010 Inguinal hernia 2011  Family History: brother with colon polyps No colon cancer, no esophageal cancer  Social History: he is married, he has 3 children, he works as a Medical illustrator, quit smoking, he drinks one alcoholic beverage per day and 2 caffeinated beverages per day  Review of Systems       Pertinent positive and negative review of systems were noted in the above HPI and GI specific review of systems.  All other  review of systems was otherwise negative.   Vital Signs:  Patient profile:   65 year old male Height:      73 inches Weight:      205 pounds BMI:     27.14 BSA:     2.18 Pulse rate:   60 / minute Pulse rhythm:   regular BP sitting:   128 / 80  (left arm) Cuff size:   regular  Vitals Entered By: Ok Anis CMA (October 16, 2010 9:39 AM)  Physical Exam  Additional Exam:  Constitutional: generally well appearing Psychiatric: alert and oriented times 3 Eyes: extraocular movements intact Mouth: oropharynx moist, no lesions Neck: supple, no lymphadenopathy Cardiovascular: heart regular rate and rythm Lungs: CTA bilaterally Abdomen: soft, non-tender, non-distended, no obvious ascites, no peritoneal signs, normal bowel sounds Extremities: no lower extremity edema bilaterally Skin: no lesions on visible extremities    Impression & Recommendations:  Problem # 1:  Barrett's esophagus he does have chronic, intermittent, nonprogressive dysphasia and has been dilated in the past. His symptoms are not worrisome to him, I do not think these are really alarm symptoms. He has no other worrisome findings and I think we will proceed with EGD at 3 year interval which would be May 2012.  he asks about changing from Nexium to omeprazole do to insurance co-pays, I am happy to do that for him.  Problem # 2:  routine risk for colon cancer we will put him into our recall system for a repeat colonoscopy at 10 year interval, April  2020.  Patient Instructions: 1)  You need routine, screening colonoscopy 02/2019. 2)  You need EGD in May 2012 for Barrett's surveillance. 3)  Stay on nexium once daily. 4)  A copy of this information will be sent to Dr. Marjory Lies. 5)  Will change to omeprazole once daily. 6)  The medication list was reviewed and reconciled.  All changed / newly prescribed medications were explained.  A complete medication list was provided to the patient /  caregiver. Prescriptions: OMEPRAZOLE 40 MG  CPDR (OMEPRAZOLE) 1 each day 30 minutes before meal  #90 x 3   Entered and Authorized by:   Rachael Fee MD   Signed by:   Rachael Fee MD on 10/16/2010   Method used:   Faxed to ...       [facility] (mail-order)             , [place]         Ph: [phone]       Fax: [phone]   RxID:   [id number]   Appended Document: recall colon EGD    Clinical Lists Changes  Observations: Added new observation of EGD DUE: 03/2011 (10/16/2010 10:04) Added new observation of COLONNXTDUE: 02/2019 (10/16/2010 10:04)

## 2010-12-11 NOTE — Procedures (Signed)
Summary: Colon    NAME:  DALTEN, AMBROSINO                           ACCOUNT NO.:  [id number]   MEDICAL RECORD NO.:  [id number]                   PATIENT TYPE:  AMB   LOCATION:  ENDO                                 [facility]AC[facility]   PHYSICIAN:  Georgiana Spinner, M.D.                 DATE OF BIRTH:  31-Mar-1946   DATE OF PROCEDURE:  10/13/2002  DATE OF DISCHARGE:                                 OPERATIVE REPORT   PROCEDURE:  Colonoscopy.   ENDOSCOPIST:  Georgiana Spinner, M.D.   INDICATIONS:   ANESTHESIA:  Demerol 50 mg, Versed 4 mg.   DESCRIPTION OF PROCEDURE:  With the patient mildly sedated in the left  lateral decubitus position, the Olympus videoscopic colonoscope was inserted  in the rectum  after normal rectal exam and passed under direct vision to  the cecum, identified by ileocecal valve and appendiceal orifice.  From this  point, the colonoscope was slowly withdrawn taken circumferential views of  the entire colonic mucosa only stopping in the rectum which appeared normal  on direct and retroflexed view.  The endoscope hemorrhoids.  The endoscope  was straightened and withdrawn.  The  patient's vital signs and pulse  oximeter remained stable.  The patient tolerated the procedure well without  apparent complications.   FINDINGS:  Internal hemorrhoids, otherwise unremarkable exam.   PLAN:  Repeat examination in approximately 5-10 years.                                               Georgiana Spinner, M.D.    GMO/MEDQ  D:  10/13/2002  T:  10/13/2002  Job:  [id number]   cc:   Gloriajean Dell. Andrey Campanile, M.D.

## 2011-02-17 LAB — GLUCOSE, CAPILLARY
Glucose-Capillary: 105 mg/dL — ABNORMAL HIGH (ref 70–99)
Glucose-Capillary: 107 mg/dL — ABNORMAL HIGH (ref 70–99)
Glucose-Capillary: 107 mg/dL — ABNORMAL HIGH (ref 70–99)
Glucose-Capillary: 107 mg/dL — ABNORMAL HIGH (ref 70–99)
Glucose-Capillary: 113 mg/dL — ABNORMAL HIGH (ref 70–99)
Glucose-Capillary: 91 mg/dL (ref 70–99)
Glucose-Capillary: 98 mg/dL (ref 70–99)
Glucose-Capillary: 99 mg/dL (ref 70–99)
Glucose-Capillary: 96 mg/dL (ref 70–99)
Glucose-Capillary: 98 mg/dL (ref 70–99)

## 2011-02-17 LAB — CBC
Hemoglobin: 10.6 g/dL — ABNORMAL LOW (ref 13.0–17.0)
Hemoglobin: 9.7 g/dL — ABNORMAL LOW (ref 13.0–17.0)
MCHC: 33.4 g/dL (ref 30.0–36.0)
MCV: 89.5 fL (ref 78.0–100.0)
RBC: 3.16 MIL/uL — ABNORMAL LOW (ref 4.22–5.81)
RBC: 3.5 MIL/uL — ABNORMAL LOW (ref 4.22–5.81)
WBC: 10.5 10*3/uL (ref 4.0–10.5)
WBC: 11.7 10*3/uL — ABNORMAL HIGH (ref 4.0–10.5)

## 2011-02-17 LAB — PROTIME-INR
INR: 1.1 (ref 0.00–1.49)
INR: 1.1 (ref 0.00–1.49)
INR: 1.3 (ref 0.00–1.49)
Prothrombin Time: 14.2 seconds (ref 11.6–15.2)
Prothrombin Time: 14.4 seconds (ref 11.6–15.2)
Prothrombin Time: 16.7 seconds — ABNORMAL HIGH (ref 11.6–15.2)

## 2011-02-17 LAB — BASIC METABOLIC PANEL
CO2: 22 mEq/L (ref 19–32)
Calcium: 7.8 mg/dL — ABNORMAL LOW (ref 8.4–10.5)
Chloride: 101 mEq/L (ref 96–112)
Creatinine, Ser: 1.27 mg/dL (ref 0.4–1.5)
GFR calc Af Amer: >60 mL/min (ref >60)
GFR calc Af Amer: >60 mL/min (ref >60)
GFR calc non Af Amer: >60 mL/min (ref >60)
Sodium: 133 mEq/L — ABNORMAL LOW (ref 135–145)
Sodium: 138 mEq/L (ref 135–145)

## 2011-02-18 LAB — BLOOD GAS, ARTERIAL
Bicarbonate: 23.6 mEq/L (ref 20.0–24.0)
FIO2: 0.21 %
Patient temperature: 98.6
pCO2 arterial: 38 mmHg (ref 35.0–45.0)
pH, Arterial: 7.411 (ref 7.350–7.450)
pO2, Arterial: 97.7 mmHg (ref 80.0–100.0)

## 2011-02-18 LAB — POCT I-STAT, CHEM 8
Chloride: 109 mEq/L (ref 96–112)
Creatinine, Ser: 0.9 mg/dL (ref 0.4–1.5)
HCT: 38 % — ABNORMAL LOW (ref 39.0–52.0)
Hemoglobin: 12.9 g/dL — ABNORMAL LOW (ref 13.0–17.0)
Potassium: 4.1 mEq/L (ref 3.5–5.1)
Sodium: 140 mEq/L (ref 135–145)

## 2011-02-18 LAB — CBC
HCT: 36.2 % — ABNORMAL LOW (ref 39.0–52.0)
Hemoglobin: 12.3 g/dL — ABNORMAL LOW (ref 13.0–17.0)
Hemoglobin: 13.8 g/dL (ref 13.0–17.0)
MCHC: 34 g/dL (ref 30.0–36.0)
MCV: 90 fL (ref 78.0–100.0)
Platelets: 151 10*3/uL (ref 150–400)
RBC: 4.02 MIL/uL — ABNORMAL LOW (ref 4.22–5.81)
RDW: 15 % (ref 11.5–15.5)
RDW: 15.3 % (ref 11.5–15.5)
WBC: 13.4 10*3/uL — ABNORMAL HIGH (ref 4.0–10.5)

## 2011-02-18 LAB — APTT
aPTT: 33 seconds (ref 24–37)
aPTT: 38 seconds — ABNORMAL HIGH (ref 24–37)

## 2011-02-18 LAB — POCT I-STAT 3, ART BLOOD GAS (G3+)
Acid-base deficit: 1 mmol/L (ref 0.0–2.0)
Bicarbonate: 19.2 mEq/L — ABNORMAL LOW (ref 20.0–24.0)
Bicarbonate: 24.4 mEq/L — ABNORMAL HIGH (ref 20.0–24.0)
Bicarbonate: 25.7 mEq/L — ABNORMAL HIGH (ref 20.0–24.0)
O2 Saturation: 100 %
O2 Saturation: 100 %
O2 Saturation: 99 %
TCO2: 20 mmol/L (ref 0–100)
TCO2: 26 mmol/L (ref 0–100)
TCO2: 27 mmol/L (ref 0–100)
TCO2: 27 mmol/L (ref 0–100)
pCO2 arterial: 26.1 mmHg — ABNORMAL LOW (ref 35.0–45.0)
pCO2 arterial: 41.4 mmHg (ref 35.0–45.0)
pCO2 arterial: 42.3 mmHg (ref 35.0–45.0)
pCO2 arterial: 46.6 mmHg — ABNORMAL HIGH (ref 35.0–45.0)
pH, Arterial: 7.339 — ABNORMAL LOW (ref 7.350–7.450)
pH, Arterial: 7.345 — ABNORMAL LOW (ref 7.350–7.450)
pH, Arterial: 7.389 (ref 7.350–7.450)
pO2, Arterial: 118 mmHg — ABNORMAL HIGH (ref 80.0–100.0)
pO2, Arterial: 180 mmHg — ABNORMAL HIGH (ref 80.0–100.0)
pO2, Arterial: 256 mmHg — ABNORMAL HIGH (ref 80.0–100.0)
pO2, Arterial: 270 mmHg — ABNORMAL HIGH (ref 80.0–100.0)
pO2, Arterial: 76 mmHg — ABNORMAL LOW (ref 80.0–100.0)

## 2011-02-18 LAB — POCT I-STAT 4, (NA,K, GLUC, HGB,HCT)
Glucose, Bld: 128 mg/dL — ABNORMAL HIGH (ref 70–99)
HCT: 31 % — ABNORMAL LOW (ref 39.0–52.0)
HCT: 36 % — ABNORMAL LOW (ref 39.0–52.0)
HCT: 36 % — ABNORMAL LOW (ref 39.0–52.0)
HCT: 39 % (ref 39.0–52.0)
Hemoglobin: 11.2 g/dL — ABNORMAL LOW (ref 13.0–17.0)
Hemoglobin: 12.2 g/dL — ABNORMAL LOW (ref 13.0–17.0)
Hemoglobin: 12.2 g/dL — ABNORMAL LOW (ref 13.0–17.0)
Hemoglobin: 13.3 g/dL (ref 13.0–17.0)
Potassium: 3.8 mEq/L (ref 3.5–5.1)
Potassium: 4.1 mEq/L (ref 3.5–5.1)
Potassium: 5 mEq/L (ref 3.5–5.1)
Potassium: 5.8 mEq/L — ABNORMAL HIGH (ref 3.5–5.1)
Sodium: 131 mEq/L — ABNORMAL LOW (ref 135–145)
Sodium: 135 mEq/L (ref 135–145)
Sodium: 139 mEq/L (ref 135–145)
Sodium: 141 mEq/L (ref 135–145)

## 2011-02-18 LAB — URINALYSIS, ROUTINE W REFLEX MICROSCOPIC
Nitrite: NEGATIVE
Specific Gravity, Urine: 1.005 (ref 1.005–1.030)
pH: 7.5 (ref 5.0–8.0)

## 2011-02-18 LAB — PROTIME-INR
INR: 1.3 (ref 0.00–1.49)
Prothrombin Time: 16.8 seconds — ABNORMAL HIGH (ref 11.6–15.2)

## 2011-02-18 LAB — ANAEROBIC CULTURE

## 2011-02-18 LAB — COMPREHENSIVE METABOLIC PANEL
ALT: 18 U/L (ref 0–53)
Albumin: 4.1 g/dL (ref 3.5–5.2)
Alkaline Phosphatase: 45 U/L (ref 39–117)
Glucose, Bld: 97 mg/dL (ref 70–99)
Potassium: 4.7 mEq/L (ref 3.5–5.1)
Sodium: 140 mEq/L (ref 135–145)
Total Protein: 7 g/dL (ref 6.0–8.3)

## 2011-02-18 LAB — TYPE AND SCREEN
ABO/RH(D): O POS
Antibody Screen: NEGATIVE

## 2011-02-18 LAB — POCT I-STAT 3, VENOUS BLOOD GAS (G3P V)
Acid-base deficit: 2 mmol/L (ref 0.0–2.0)
O2 Saturation: 77 %
TCO2: 25 mmol/L (ref 0–100)
pO2, Ven: 45 mmHg (ref 30.0–45.0)

## 2011-02-18 LAB — GLUCOSE, CAPILLARY: Glucose-Capillary: 102 mg/dL — ABNORMAL HIGH (ref 70–99)

## 2011-02-18 LAB — TISSUE CULTURE

## 2011-02-18 LAB — HEMOGLOBIN AND HEMATOCRIT, BLOOD
HCT: 31.4 % — ABNORMAL LOW (ref 39.0–52.0)
Hemoglobin: 10.7 g/dL — ABNORMAL LOW (ref 13.0–17.0)

## 2011-02-18 LAB — PLATELET COUNT: Platelets: 186 10*3/uL (ref 150–400)

## 2011-02-18 LAB — CREATININE, SERUM: Creatinine, Ser: 0.96 mg/dL (ref 0.4–1.5)

## 2011-02-18 LAB — HEMOGLOBIN A1C: Hgb A1c MFr Bld: 4.6 % (ref 4.6–6.1)

## 2011-02-20 LAB — PROTIME-INR: Prothrombin Time: 15.3 seconds — ABNORMAL HIGH (ref 11.6–15.2)

## 2011-02-21 LAB — LIPID PANEL
LDL Cholesterol: 73 mg/dL (ref 0–99)
VLDL: 13 mg/dL (ref 0–40)

## 2011-02-21 LAB — BASIC METABOLIC PANEL
BUN: 10 mg/dL (ref 6–23)
Calcium: 9 mg/dL (ref 8.4–10.5)
Chloride: 101 mEq/L (ref 96–112)
Chloride: 104 mEq/L (ref 96–112)
Chloride: 104 mEq/L (ref 96–112)
Creatinine, Ser: 0.91 mg/dL (ref 0.4–1.5)
Creatinine, Ser: 0.87 mg/dL (ref 0.4–1.5)
GFR calc Af Amer: >60 mL/min (ref >60)
GFR calc non Af Amer: >60 mL/min (ref >60)
GFR calc non Af Amer: >60 mL/min (ref >60)
Potassium: 4.4 mEq/L (ref 3.5–5.1)
Sodium: 136 mEq/L (ref 135–145)

## 2011-02-21 LAB — CULTURE, BLOOD (ROUTINE X 2): Culture: NO GROWTH

## 2011-02-21 LAB — URINALYSIS, ROUTINE W REFLEX MICROSCOPIC
Nitrite: NEGATIVE
Specific Gravity, Urine: 1.008 (ref 1.005–1.030)
pH: 6 (ref 5.0–8.0)

## 2011-02-21 LAB — CBC
HCT: 32.7 % — ABNORMAL LOW (ref 39.0–52.0)
HCT: 34.4 % — ABNORMAL LOW (ref 39.0–52.0)
Hemoglobin: 11.6 g/dL — ABNORMAL LOW (ref 13.0–17.0)
MCHC: 34.5 g/dL (ref 30.0–36.0)
MCV: 89.2 fL (ref 78.0–100.0)
MCV: 89.6 fL (ref 78.0–100.0)
MCV: 89.6 fL (ref 78.0–100.0)
Platelets: 306 10*3/uL (ref 150–400)
Platelets: 345 10*3/uL (ref 150–400)
RBC: 3.85 MIL/uL — ABNORMAL LOW (ref 4.22–5.81)
RBC: 4.51 MIL/uL (ref 4.22–5.81)
RDW: 12.6 % (ref 11.5–15.5)
WBC: 10.4 10*3/uL (ref 4.0–10.5)
WBC: 13.2 10*3/uL — ABNORMAL HIGH (ref 4.0–10.5)
WBC: 12.9 10*3/uL — ABNORMAL HIGH (ref 4.0–10.5)

## 2011-02-21 LAB — COMPREHENSIVE METABOLIC PANEL
AST: 15 U/L (ref 0–37)
AST: 21 U/L (ref 0–37)
Albumin: 2.9 g/dL — ABNORMAL LOW (ref 3.5–5.2)
BUN: 10 mg/dL (ref 6–23)
BUN: 11 mg/dL (ref 6–23)
CO2: 26 mEq/L (ref 19–32)
Calcium: 8.5 mg/dL (ref 8.4–10.5)
Chloride: 103 mEq/L (ref 96–112)
Chloride: 106 mEq/L (ref 96–112)
Creatinine, Ser: 0.91 mg/dL (ref 0.4–1.5)
Creatinine, Ser: 1.08 mg/dL (ref 0.4–1.5)
GFR calc Af Amer: >60 mL/min (ref >60)
GFR calc non Af Amer: >60 mL/min (ref >60)
Glucose, Bld: 102 mg/dL — ABNORMAL HIGH (ref 70–99)
Total Bilirubin: 0.5 mg/dL (ref 0.3–1.2)
Total Bilirubin: 0.7 mg/dL (ref 0.3–1.2)
Total Protein: 5.9 g/dL — ABNORMAL LOW (ref 6.0–8.3)

## 2011-02-21 LAB — PROTIME-INR
INR: 1.1 (ref 0.00–1.49)
INR: 1.3 (ref 0.00–1.49)
Prothrombin Time: 16.2 seconds — ABNORMAL HIGH (ref 11.6–15.2)

## 2011-02-21 LAB — URINE DRUGS OF ABUSE SCREEN W ALC, ROUTINE (REF LAB)
Barbiturate Quant, Ur: NEGATIVE
Creatinine,U: 46.5 mg/dL
Ethyl Alcohol: <10 mg/dL (ref <10)
Marijuana Metabolite: NEGATIVE

## 2011-02-21 LAB — CARDIAC PANEL(CRET KIN+CKTOT+MB+TROPI): Troponin I: 0.12 ng/mL — ABNORMAL HIGH (ref 0.00–0.06)

## 2011-02-21 LAB — DIFFERENTIAL
Lymphocytes Relative: 10 % — ABNORMAL LOW (ref 12–46)
Lymphs Abs: 1.3 10*3/uL (ref 0.7–4.0)
Monocytes Relative: 5 % (ref 3–12)
Neutro Abs: 10.9 10*3/uL — ABNORMAL HIGH (ref 1.7–7.7)
Neutrophils Relative %: 84 % — ABNORMAL HIGH (ref 43–77)

## 2011-02-21 LAB — RPR: RPR Ser Ql: NONREACTIVE

## 2011-02-21 LAB — C4 COMPLEMENT: Complement C4, Body Fluid: 29 mg/dL (ref 16–47)

## 2011-02-21 LAB — ANA: Anti Nuclear Antibody(ANA): NEGATIVE

## 2011-02-21 LAB — COMPLEMENT, TOTAL: Compl, Total (CH50): >60 U/mL (ref 31–66)

## 2011-02-21 LAB — HIV ANTIBODY (ROUTINE TESTING W REFLEX): HIV: NONREACTIVE

## 2011-02-21 LAB — APTT
aPTT: 33 seconds (ref 24–37)
aPTT: 40 seconds — ABNORMAL HIGH (ref 24–37)

## 2011-02-21 LAB — HEMOGLOBIN A1C
Hgb A1c MFr Bld: 5.3 % (ref 4.6–6.1)
Mean Plasma Glucose: 105 mg/dL

## 2011-02-21 LAB — EXTRACTABLE NUCLEAR ANTIGEN ANTIBODY
Scleroderma (Scl-70) (ENA) Antibody, IgG: <0.2 AI (ref <1.0)
ds DNA Ab: <1 IU/mL (ref <5)

## 2011-03-26 NOTE — Op Note (Signed)
Edgar Jones, GEFFRE NO.:  [id number]   MEDICAL RECORD NO.:  [id number]          PATIENT TYPE:  INP   LOCATION:  2311                         FACILITY:  [facility]   PHYSICIAN:  Guadalupe Maple, M.D.  DATE OF BIRTH:  [birth year]   DATE OF PROCEDURE:  05/10/2009  DATE OF DISCHARGE:  05/08/2009                               OPERATIVE REPORT   SURGEON:  Guadalupe Maple, MD   PROCEDURE:  Intraoperative transesophageal echocardiography.    [name] is a 65 year old white male with a previous history of  endocarditis, which was treated and he has developed severe mitral  regurgitation.  He is scheduled to undergo mitral valve repair or  replacement by Dr. Dorris Fetch.  Intraoperative transesophageal  echocardiography was requested to evaluate the mitral valve and to  determine if any other valvular pathology was present and to assist with  the repair and to serve as a monitor for intraoperative volume status.   The patient was brought to the operating room at [facility] and  general anesthesia was induced without difficulty.  The trachea was  intubated without difficulty.  The transesophageal echocardiography was  probed, was then inserted into the esophagus without difficulty.   IMPRESSION:  Pre-bypass findings:  1. Aortic valve.  The aortic valve was trileaflet and appeared to open      well.  There appeared to be a small nodule or thickening upon the      coaptation line of the left coronary cusp.  There were no      vegetations noted.  There was trace aortic insufficiency that could      only be appreciated in a few views.  2. Mitral valve.  There was severe mitral regurgitation.  There      appeared to be a flail segment involving the posterior leaflet in      the P2 to P1 region.  There was a severe jet of mitral      insufficiency, which was directed anteriorly along the superior      surface of the anterior leaflet.  There was a large area of  flow      convergence.  There was also a smaller jet of mitral insufficiency      in the area of the posterior medial commissure.  There were no      vegetations upon the mitral leaflets that could be appreciated.      The anterior leaflet showed no evidence of prolapse or fluttering      and no flail segments appreciated.  3. Left ventricle.  The left ventricular cavity was dilated, but there      was good contractility in all segments interrogated.  Ejection      fraction was estimated 60-65%.  Left ventricular wall thickness      measured 1.0-1.05 cm in diameter concentrically, and the left      ventricular end-diastolic diameter measured 6.35 cm.  There was no      thrombus noted in the left ventricular apex.  4. Right ventricle.  The right ventricular  size appeared to be within      normal limits.  There was normal contractility of the right      ventricular free wall.  5. Tricuspid valve.  The tricuspid valve was structurally intact.      There was 1+ tricuspid insufficiency.  6. Intra-atrial septum.  The color Doppler interrogation of the intra-      atrial septum showed evidence of a small patent foramen ovale,      which could clearly be seen on color Doppler.  7. Left atrium.  There was no thrombus noted in the left atrium or      left atrial appendage.  8. Ascending aorta.  The ascending aorta appeared moderately      thickened, and there was mild calcification at the area of the      sinotubular ridge anteriorly, but no other significant atheromatous      disease that could be appreciated.   Post-bypass findings:  1. Aortic valve.  The aortic valve was unchanged from the prebypass      study.  There was a small nodule noted on the left coronary cusp      along the coaptation line.  There was again trace aortic      insufficiency.  There was normal opening of the valve.  2. Mitral valve.  There was annuloplasty ring in the mitral position.      The leaflets opened well,  and there was just a tiny trace mitral      insufficiency that could be appreciated occasionally.  The anterior      leaflet opened well, and the posterior leaflet appeared fixed.      There was a mean gradient of 2 mmHg on the transmitral inflow.  3. Left ventricle.  The left ventricular function showed evidence of      ventricular pacing, but there was good contractility noted and      ejection fraction again was estimated at 55-60%.  4. Right ventricle.  The right ventricular cavity appeared normal in      size with good contractility of the right ventricular free wall.           ______________________________  Guadalupe Maple, M.D.     DCJ/MEDQ  D:  05/10/2009  T:  05/11/2009  Job:  [id number]

## 2011-03-26 NOTE — Op Note (Signed)
NAME:  Edgar Jones, Edgar Jones NO.:  [id number]   MEDICAL RECORD NO.:  [id number]          PATIENT TYPE:  AMB   LOCATION:  ENDO                         FACILITY:  [facility]   PHYSICIAN:  Georgiana Spinner, M.D.    DATE OF BIRTH:  05-03-1946   DATE OF PROCEDURE:  03/24/2008  DATE OF DISCHARGE:                               OPERATIVE REPORT   PROCEDURE:  Upper endoscopy.   INDICATIONS:  GERD.   ANESTHESIA:  Fentanyl 75 mcg, Versed 6 mg.   PROCEDURE:  With the patient mildly sedated in the left lateral  decubitus position, the Pentax videoscopic endoscope was inserted in the  mouth, passed under direct vision through the esophagus which appeared  normal.  There may have been one small area of Barrett's I could see,  but the area was somewhat edematous so I could not get the  squamocolumnar junction to open in plain view.  Biopsy was taken of the  areas of the suspected Barrett's.  We entered into the stomach.  The  fundus, body, antrum, duodenal bulb, second portion duodenum all  appeared normal.  From this point the endoscope was slowly withdrawn  taking circumferential views of duodenal mucosa until the endoscope had  been pulled back into the stomach, placed on retroflexion to view the  stomach from below.  The endoscope was straightened and withdrawn taking  circumferential views of remaining gastric and esophageal mucosa.  The  patient's vital signs, pulse oximeter remained stable.  The patient  tolerated procedure well without apparent complications.   FINDINGS:  Loose wrap of the GE junction around the endoscope indicating  laxity of the lower esophageal sphincter, question of Barrett's  esophagus, not clearly seen at this point, await biopsy report.  The  patient will call me for results and follow-up with me as an outpatient.  Proceed to colonoscopy.           ______________________________  Georgiana Spinner, M.D.     GMO/MEDQ  D:  03/24/2008  T:  03/24/2008  Job:   [id number]   cc:   Marjory Lies, M.D.  Fax: [phone]

## 2011-03-26 NOTE — Consult Note (Signed)
NAME:  Edgar Jones, Edgar Jones NO.:  [id number]   MEDICAL RECORD NO.:  [id number]          PATIENT TYPE:  INP   LOCATION:  3012                         FACILITY:  [facility]   PHYSICIAN:  Salvatore Decent. Dorris Fetch, M.D.DATE OF BIRTH:  [birth year]   DATE OF CONSULTATION:  02/02/2009  DATE OF DISCHARGE:                                 CONSULTATION   REASON FOR CONSULTATION:  Mitral valve endocarditis.   HISTORY OF PRESENT ILLNESS:  Edgar Jones is a 65 year old gentleman with a  history of hypertension, hyperlipidemia and a heart murmur.  He has had  about a 42-month history of fevers, night sweats, malaise and fatigue.  He also has lost weight over that period time.  He saw a physician after  about a week who thought it was probably viral.  A week latter, he still  had not improved, but no other symptoms had developed.  About 4 days  prior to admission, he developed some left hand numbness, this lasted  about 30 minutes.  On January 31, 2009, he awoke, and he felt very poorly,  tried to go to work, he had a visual disturbance to his right side.  He  could not see a portion of a woman's face.  He was unable to see a stop  light that he knew should have been there.  He came to the emergency  room, a CT of the head demonstrated 2-cm hemorrhage on the left  occipital area.  Fever was 100.2 on admission.  He was noted to have a  heart murmur.  He had been worked up for a heart murmur in the past with  an echocardiogram, but really does not know many details about it.  He  was told that he had a valve problem, but did not need any further  assessment.  He has blood cultures positive for gram-positive cocci in  chains.  He had a TEE which showed severe mitral regurgitation.  There  was mitral valve prolapse with a flail P2 segment of the posterior  leaflet with vegetations.  He has been treated with vancomycin and  Rocephin initially that subsequently had been changed to Unasyn and  gentamicin.   The patient currently denies any chest pain, shortness of  breath, new neurologic findings.   PAST MEDICAL HISTORY:  Significant for hypertension, hyperlipidemia,  Barrett esophagus, previous esophageal dilatations.   MEDICATIONS ON ADMISSION:  1. Baby aspirin 81 mg daily.  2. Benazepril daily.  3. Nexium 40 mg daily.  4. Zocor 40 mg daily.  5. Fish oil.   ALLERGIES:  He has no known drug allergies.   FAMILY HISTORY:  Significant for stroke and coronary artery disease.   SOCIAL HISTORY:  He drinks 2 glasses of wine at night.  He does not use  tobacco.  He was in his usual state of good health until a month ago.  He works as a Medical illustrator.   REVIEW OF SYSTEMS:  See HPI.  All other systems were negative.   PHYSICAL EXAMINATION:  GENERAL:  Edgar Jones is a well-appearing 62-year-  old gentleman  in no acute distress.  VITAL SIGNS:  Temperature is 98.2, his blood pressure is 117/72, pulse  is 64, respirations are 20.  His oxygen saturation is 99% on room air.  Neurologically, he is alert and oriented x3 with no focal motor  deficits.  He does still have some right visual field loss.  HEENT:  He is wearing glasses, otherwise unremarkable.  NECK:  Has transmitted murmur sounds bilaterally.  CARDIAC:  Has a 3/6 holosystolic murmur heard throughout the precordium.  LUNGS:  Clear with no rales or wheezing.  ABDOMEN:  Soft.  Nontender.  EXTREMITIES:  Pulses 2+ throughout.  There is no splinter hemorrhages or  petechiae.   LABORATORY DATA:  Echocardiogram reviewed.  His blood cultures are  positive for gram-positive cocci in chains.  HIV is nonreactive.  Drug  screen is negative.  Sed rate is 28.  Hemoglobin A1c normal.  PT is  16.2, PTT is 40.  Cholesterol 104, HDL 18, LDL 73.  His sodium is 134,  potassium 4.6.  BUN and creatinine 10 and 0.91.  Cardiac panel has a  troponin of 0.12.   IMPRESSION:  Edgar Jones is a 65 year old gentleman with likely preexisting  mitral valve prolapse.  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  On echocardiogram, he has essentially 4+  mitral regurgitation, but he is well compensated clinically for that  with no evidence of heart failure clinically.  He does need mitral valve  repair, but as he is well compensated clinically, it is not emergent and  we would like to do it at the safest possible time for him in terms of  his recent hemorrhagic stroke.  He will need cardiopulmonary bypass and  high dose anticoagulation to have this repair done.  We also would like  to see him after several days afebrile to decrease the risk of  bacteremic seeding of prosthetic materials.  He will need a cardiac  catheterization.  He is seen in [facility]  in the past, I will ask them to see him again.      Salvatore Decent Dorris Fetch, M.D.  Electronically Signed     SCH/MEDQ  D:  02/02/2009  T:  02/03/2009  Job:  [id number]   cc:   Casimiro Needle L. Thad Ranger, M.D.

## 2011-03-26 NOTE — Discharge Summary (Signed)
NAME:  Edgar Jones, Edgar Jones NO.:  [id number]   MEDICAL RECORD NO.:  [id number]          PATIENT TYPE:  INP   LOCATION:  2009                         FACILITY:  [facility]   PHYSICIAN:  Salvatore Decent. Dorris Fetch, M.D.DATE OF BIRTH:  1946-09-09   DATE OF ADMISSION:  05/10/2009  DATE OF DISCHARGE:                               DISCHARGE SUMMARY   ADDENDUM   Edgar Jones is tentatively scheduled for discharge home on May 15, 2009.  However, for the preceding evening and that morning, he was noted to be  hypertensive with systolic blood pressures running from 140 to 150 range  and diastolics in 90 to 100 range despite titration of his benazepril  dose the day before.  Also, his INR remained unchanged at 1.1.  His  discharge was subsequently held and he was started on Norvasc 5 mg daily  for better blood pressure control.  I think, he was given an additional  dose of 5 mg of Coumadin in an attempt to bump up his INR a bit more.  He has, otherwise, remained stable.  His heart rate has been holding in  60s to 70s and was no further bradycardia.  It was anticipated that if  he continues to progress, he will hopefully be ready for discharge home  in the next 24 hours.   DISCHARGE MEDICATIONS:  Unchanged from the previously dictated discharge  summary with the addition of Norvasc 5 mg daily.  Also, please see  previously dictated discharge summary for details of discharge  instructions and followup appointment.      Coral Ceo, P.A.      Salvatore Decent Dorris Fetch, M.D.  Electronically Signed    GC/MEDQ  D:  05/15/2009  T:  05/16/2009  Job:  [id number]   cc:   Gerlene Burdock A. Alanda Amass, M.D.  Pramod P. Pearlean Brownie, MD  Dr. Virginia Rochester.  Cliffton Asters, M.D.  Marjory Lies, M.D.

## 2011-03-26 NOTE — H&P (Signed)
NAME:  Edgar Jones, Edgar Jones NO.:  [id number]   MEDICAL RECORD NO.:  [id number]          PATIENT TYPE:  INP   LOCATION:  3012                         FACILITY:  [facility]   PHYSICIAN:  Casimiro Needle L. Reynolds, M.D.DATE OF BIRTH:  [birth year]   DATE OF ADMISSION:  01/31/2009  DATE OF DISCHARGE:                              HISTORY & PHYSICAL   CHIEF COMPLAINT:  Visual change.   HISTORY OF PRESENT ILLNESS:  This is the initial [name] admission for this 65 year old male with a past medical history,  which includes hypertension and hyperlipidemia.  The patient says that  for about the past month, he has felt unwell.  He has had subjective  fevers, soaking night sweats, malaise, and weight loss.  He says he has  been to his primary physician a few times, and nothing yet has been  found.  Then about 4 days ago, he had a transient numbness of the one  hand, which lasted about 30 minutes and resolve.  There was no  associated weakness.  He believes with left hand, although he is not  certain of this.  Today, he woke up feeling particularly badly and was  not sure he can go to work.  He then tried to go to work later in the  morning and noticed a visual disturbance off to his right side.  For  example, he looked at a woman face and could not see her left eye, and  then noticed as he was driving, he saw only 1 stoplight in a place where  he knew there had to be 2.  He also had some difficulty breathing.  Because of his presentation, we advised him to come to the emergency  department for evaluation.  He had a CT of the head, which demonstrated  a round 2-cm hemorrhage in the left occipital area, near the cortical  surface.  He is now admitted for further considerations.  He complains  of a slight headache but his major complaint is his night sweats,  fevers, and malaise.   PAST MEDICAL HISTORY:  1. Hypertension.  2. Hyperlipidemia as above.  3. He had also had a  few esophageal dilatations.  He was thought to      have Barrett esophagus, although the diagnosis is now in question.   FAMILY HISTORY:  Remarkable for stroke in his grandmother, coronary  artery disease in his mother, and breast cancer in his sister.   SOCIAL HISTORY:  He has about 2 glasses of wine at night.  He denies  tobacco use.  He is normally independent in his activities of daily  living and works as a Medical illustrator for [employer].   ALLERGIES:  No known drug allergies.   MEDICATIONS:  1. Baby aspirin 81 mg daily.  2. Benazepril unknown dose daily.  3. Nexium 40 mg daily.  4. Zocor 40 mg daily.  5. Fish oil.   REVIEW OF SYSTEMS:  Per HPI.  Pertinent negatives includes no chest  pain, palpitations, nausea, vomiting, shortness of breath, or cough.  Otherwise, full 10-system review  of systems is negative except as  outlined in the HPI and in the accompanying admission nursing record.   PHYSICAL EXAMINATION:  VITAL SIGNS:  Temperature 100.2, blood pressure  113/82, pulse 79, respirations 20.  GENERAL:  This is a healthy appearing man, seated on the gurney, in no  distress.  HEENT:  Head, cranium is normocephalic and atraumatic.  Oropharynx  benign.  NECK:  Supple without carotid or supraclavicular bruits.  HEART:  Regular rate and rhythm with a 3/6 systolic ejection murmur,  which is old per the patient.  CHEST:  Clear to auscultation bilaterally.  NEUROLOGIC:  Mental status:  He is awake and alert.  He is oriented to  time and place.  Recent and remote memory are intact.  Attention span,  concentration, and fund of knowledge are all appropriate.  Speech is  fluent and not dysarthric.  He has no difficulty with complex commands,  and can name objects and repeat phrases.  Cranial nerves:  Funduscopic exam is benign.  Pupils are equal and  briskly reactive.  Extraocular movements full without nystagmus.  Visual  field testing is actually full to finger counting.  Face, tongue,  and  palate move normally and symmetrically.  Facial sensation is intact to  pinprick.  Motor:  Normal bulk and tone.  No drift.  Normal strength of his  extremity muscles.  Sensation:  Intact to light touch, pinprick, and double stimulation  throughout.  Coordination:  Finger-to-nose, heel-to-shin performed well.  Gait is  deferred.  Reflexes 2+ and symmetric.  Toes downgoing bilaterally.   LABORATORY REVIEW:  CBC, white count 12.9, hemoglobin 13.5, platelets  327,000.  BMET is remarkable for slightly low sodium of 133, otherwise  normal.   CT of the head demonstrates the finding as above and is, otherwise,  unremarkable.   IMPRESSION:  1. 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  2. Fever of unknown origin.   PLAN:  We will admit him to stroke service for stroke workup, which will  include an MRI with contrast to rule out the possibility of metastasis.  He will also have routine studies to include echocardiogram, carotid and  transcranial Dopplers, and stroke labs.  We will have an Internal  Medicine Teaching service evaluate him in the morning for further workup  of his fever of unknown origin.  In the meantime, I will check his sed  rate, draw blood cultures, and place a PPD with control.  Stroke Service  to follow.      Michael L. Thad Ranger, M.D.  Electronically Signed     MLR/MEDQ  D:  01/31/2009  T:  02/01/2009  Job:  [id number]

## 2011-03-26 NOTE — Assessment & Plan Note (Signed)
OFFICE VISIT   Edgar Jones, Edgar Jones  DOB:  October 20, 1946                                        Mar 13, 2009  CHART #:  [id number]   The patient is a 65 year old gentleman, who presented with visual  changes.  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  He  had 4+ mitral regurgitation with central posterior leaflet flail with a  vegetation documented.  He has been treated with Rocephin.  He will  finish his course tomorrow.  He had a colonoscopy done by Dr. Virginia Rochester, which  the patient says was fine.  He has seen Dr. Pearlean Brownie and still has slight  peripheral vision defect, although subjectively the patient's vision is  much improved.  He says that he does feel generally a little weaker and  without as much energy as he had in the past, that has been really  stable since he was in the hospital.  He has not had any orthopnea,  paroxysmal nocturnal dyspnea, or peripheral edema.  He does get a little  short of breath with exertion.   His current medications are Nexium 40 mg daily, ceftriaxone 2 g daily,  benazepril 10 mg daily, simvastatin 40 mg daily, and calcium with  vitamin D 600 mg daily.   He has no known drug allergies.   PHYSICAL EXAMINATION:  GENERAL:  The patient is a 65 year old gentleman  in no acute distress.  VITAL SIGNS:  Blood pressure 141/80, pulse 84, respirations are 16, and  his oxygen saturation is 97% on room air.  LUNGS:  Clear with equal breath sounds.  CARDIAC:  A 3/6 holosystolic murmur heard throughout the precordium.  EXTREMITIES:  He has no peripheral edema.  He has 2+ pulses.  He has  brisk capillary refill.   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  He has severe mitral regurgitation with a flail posterior  leaflet P2.  He needs a mitral valve  repair.  We need to wait at least 8-  12 weeks from the time of the stroke.  As he is doing very well  clinically and is well compensated with his heart failure currently, I  think we would be better off waiting more towards the 12-week portion of  that procedure.  He has had a dental exam about 2 months prior to his  admission so that was within the last 3 months, so he does not  necessarily need repeat dental exam, but did cautioned him that if he  were to develop any problems, loose filling or cap breaking off or  anything like that, he should see a dentist immediately.  In this case  he should use endocarditis prophylaxis if he does have any dental work  done.   The patient tentatively wants to go ahead and schedule his some mitral  valve repair for Wednesday May 10, 2009.  We will plan to have him come  back to the office on Monday May 08, 2009, so that we can sit down and  review everything once again and going to more details on risks and  benefits.  We did discuss that his valve should be repairable  and that  would be preferable to our replacement and just some of the general  risks as well as some general issues regarding recovery and return to  functional status.   Edgar Jones Dorris Fetch, M.D.  Electronically Signed   SCH/MEDQ  D:  03/13/2009  T:  03/14/2009  Job:  [id number]   cc:   Marjory Lies, M.D.  Richard A. Alanda Amass, M.D.  Pramod P. Pearlean Brownie, MD  Georgiana Spinner, M.D.  Cliffton Asters, M.D.

## 2011-03-26 NOTE — Discharge Summary (Signed)
Edgar Jones, Edgar Jones                 ACCOUNT NO.:  [id number]   MEDICAL RECORD NO.:  [id number]          PATIENT TYPE:  INP   LOCATION:  3012                         FACILITY:  [facility]   PHYSICIAN:  Pramod P. Pearlean Brownie, MD    DATE OF BIRTH:  June 01, 1946   DATE OF ADMISSION:  01/31/2009  DATE OF DISCHARGE:  02/08/2009                               DISCHARGE SUMMARY   DIAGNOSES AT DISCHARGE:  1. 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  2. Hypertension.  3. Hyperlipidemia.  4. 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   MEDICINES AT THE TIME OF DISCHARGE:  1. Nexium 40 mg a day.  2. Zocor 40 mg a day.  3. Benazepril 10 mg a day.  4. Calcium 600 mg a day.  5. Rocephin 2 g IV a day through Mar 15, 2009.   STUDIES PERFORMED:  1. CT of the brain on admission showed focal hemorrhage in the left      parietal occipital lobe, etiology indeterminate.  Followup CT 24      hours showed slight interval decrease in size of left occipital      intraparenchymal hemorrhage.  2. MRI of the brain shows a left occipital parenchymal hemorrhage with      surrounding edema, not significantly changed from recent CTs and is      without associated enhancement evidence of underlying infarct or      significant mass effect.  There is a second nonhemorrhagic lesion      in the right parietal lobe, which enhances his cortically base and      has mild edema.  There is an adjacent draining cortical vein,      otherwise nonspecific cerebral white matter signal abnormality.  3. MRA of the neck is negative.  4. MRA of the brain is negative.  5. CT of the chest shows no active cardiopulmonary disease.  There is      a pulmonary nodule within the right middle lobe that measures 4 mm.      Followup CT in 1 year.  6. CT of the abdomen shows no acute findings, renal  cyst,      indeterminate hypodensity within the right hepatic lobe, too small      to characterize.  7. CT of the pelvis shows small amount of free fluid within the pelvis      is nonspecific.  8. Chest x-ray shows a PICC in the mid to lower SVC.  9. 2-D echocardiogram cannot exclude tiny vegetation on ventricular      side of aortic valve, trivial aortic valve regurgitation, nodular      thickening near tip of posterior mitral leaflet, suspicious for      vegetation versus ruptured chordae, mitral regurg was 4+ on the      scale from 0 to 4+, small PFO, possible small fenestration of      interatrial septum by color Doppler.  Bubble study did not show      shunt.  10.Carotid Doppler shows no ICA stenosis, AV Dopplers showed ABIs and      Doppler waveforms within normal limits.  11.TEE performed by Dr. Dietrich Pates shows mitral valve thickening      mostly posterior leaflet.  Posterior leaflet is flail.  There is      nodular density 10 x 6 near tip consistent with vegetation.  Mitral      regurgitation is severe 4/4, radiating into pulmonary vein.  AV      mildly thickened with no definite vegetation or atrial      insufficiency.  TV normal, trace TR, PV grossly normal, PFO by      color Doppler small, possible fenestrated not seen with bubble      study due to MR.  12.Cardiac cath.  No coronary artery disease.  No CHF.  13.EKG shows normal sinus rhythm, minimal voltage criteria for LVH.   HISTORY OF PRESENT ILLNESS:  Edgar Jones is a 65 year old Caucasian male  with a history of hypertension and hyperlipidemia.  For the past month,  he has felt unwell.  He has had subjective fevers, soaking night sweats,  malaise, and weight loss.  He has been to his primary physician a few  times and nothing has been found.  About 4 days prior to admission, he  had transient numbness of one hand, which lasted about 30 minutes, then  resolved.  He had no associated weakness.  He believes with left  hand,  though it is uncertain.  The morning of admission, he woke, feeling  particularly bad, and was not sure he could go to work.  He tried to go  to work, but later in the morning noticed a visual disturbance on his  right side.  For example, he looked at a woman's face and cannot see her  left eye and then noticed that he was driving, he only saw one stop  right in a place where he knew there should be two.  He has also had  some difficulty breathing.  Because of his presentation, he was advised  to come to the emergency room for evaluation.  CT of the head  demonstrated around 2-cm hemorrhage in the left occipital area near the  cortical surface.  He was admitted to the hospital for further stroke  evaluation.  He was not a t-PA candidate secondary to hemorrhage.   HOSPITAL COURSE:  Hemorrhage was found to be secondary to S. bovis  bacterial endocarditis with a flail mitral valve found on  transesophageal echocardiogram.  He was started on antibiotics in  Infectious Disease, and Cardiology was consulted.  Once the organism was  identified, he was chose to place on Rocephin for a total of 37 days.  This will be administered by [facility].  Dr. Dorris Fetch saw  him and plans are for surgery for valve repair in 2-3 months.  Cardiology saw him and performed a cardiac cath clearing him of coronary  artery disease and safety for surgery.  As S. bovis is highly associated  with colon cancer, plans are to do a colonoscopy within 1 month.  Of  note, the patient did have colonoscopy 2 years ago by Dr. Sabino Gasser  with unrevealing findings.  Plan to discharge the patient home as he has  no neurologic deficits.  Continue with IV antibiotics and followup as  below.   CONDITION AT DISCHARGE:  The patient is  alert and oriented x3.  Speech  clear.  Visual fields full.  Face symmetric.  Strength normal.  Gait  steady.  Heart rate is regular.  Breath sounds are clear.   DISCHARGE PLAN:   1. Discharge home.  2. No aspirin or aspirin-containing products.  3. IV Rocephin, administer by [facility] via PICC line through      Mar 15, 2009.  4. Outpatient colonoscopy with Dr. Sabino Gasser within 1 month.  5. Follow up with Dr. Delia Heady in 2 months.  6. Plan for open heart surgery for valve repair by Dr. Dorris Fetch in      2-3 months.       Annie Main, N.P.    ______________________________  Sunny Schlein. Pearlean Brownie, MD    SB/MEDQ  D:  02/08/2009  T:  02/09/2009  Job:  [id number]   cc:   Pramod P. Pearlean Brownie, MD  Georgiana Spinner, M.D.  Cliffton Asters, M.D.  Marjory Lies, M.D.  Salvatore Decent Dorris Fetch, M.D.  Richard A. Alanda Amass, M.D.

## 2011-03-26 NOTE — Op Note (Signed)
NAME:  Edgar Jones, Edgar Jones NO.:  [id number]   MEDICAL RECORD NO.:  [id number]          PATIENT TYPE:  AMB   LOCATION:  ENDO                         FACILITY:  [facility]   PHYSICIAN:  Georgiana Spinner, M.D.    DATE OF BIRTH:  Oct 15, 1946   DATE OF PROCEDURE:  DATE OF DISCHARGE:                               OPERATIVE REPORT   PROCEDURE:  Colonoscopy.   INDICATIONS:  Colon polyps.  Rule out colon cancer because of strep  bovis bacteremia.   ANESTHESIA:  Fentanyl 125 mcg, Versed 12 mg.   PROCEDURE:  With the patient mildly sedated in the left lateral  decubitus position, a rectal exam was performed which was unremarkable  to my examination.  Subsequently, the Pentax videoscopic pediatric  colonoscope was inserted in the rectum and passed through a very  tortuous rectosigmoid and sigmoid colon area with pressure applied and  the patient rolled to his back and subsequently to his right side, we  were able to reach the cecum identified by ileocecal valve and  appendiceal orifice, both of which were photographed.  One of the loops  of the ileocecal valve was prominent so I elected to biopsy it and from  this point, the colonoscope was then slowly withdrawn taking  circumferential views of colonic mucosa, stopping in the transverse  colon where a polyp was seen, photographed and removed using hot biopsy  forceps technique at setting of 20/150 blended current.  We next stopped  in the rectum which appeared normal on direct and showed hemorrhoids on  retroflexed view.  The endoscope was straightened and withdrawn.  The  patient's vital signs and pulse oximeter remained stable.  The patient  tolerated the procedure well without apparent complications.   FINDINGS:  1. Polyp of transverse colon.  2. Internal hemorrhoids.  3. Fullness of one of the valves of the ileocecal valve, which may be      normal variant.  Await biopsy reports.  The patient will call me      for results and  follow-up with me as an outpatient.           ______________________________  Georgiana Spinner, M.D.     GMO/MEDQ  D:  03/02/2009  T:  03/02/2009  Job:  [id number]   cc:   Cliffton Asters, M.D.  Fax: [phone]

## 2011-03-26 NOTE — Assessment & Plan Note (Signed)
OFFICE VISIT   Edgar Jones, Edgar Jones  DOB:  01/10/1946                                        June 01, 2009  CHART #:  [id number]   The patient is a 65 year old gentleman, who presented back in May with a  hemorrhagic stroke, this is from the embolic event from mitral valve  endocarditis with Streptococcus bovis.  He had severe mitral  regurgitation, so because of his acute stroke, he was not a candidate  for surgery at that time.  He was treated with antibiotics to recover  from a stroke and subsequently underwent a mitral valve repair with a  posterior leaflet resection and annuloplasty ring on May 10, 2009.  Postoperatively, he had some bradycardia and he had some anemia, but  otherwise did well.  He continues to do well at this time.  He is now  about 3 weeks out from surgery.  He still has some soreness, but usually  just takes Tylenol for that.  He does not happen to take any narcotics.  He walked about three-quarters of a mile this morning.  He has been  seeing some progress with his stamina.  He has not had any shortness of  breath or any neurologic symptoms.  He does say his heart rate is a  little faster than he is used to, but has not had any irregular heart  rhythms or very rapid heart rhythms that he is aware of.   His current medications are Nexium 40 mg daily, simvastatin 40 mg daily,  aspirin 325 mg daily, Coumadin 5 mg daily, Norvasc 5 mg daily.  He has a  prescription for oxycodone p.r.n., but he is not having to take those.  He is just using Tylenol for pain.   He has no known drug allergies.   PHYSICAL EXAMINATION:  GENERAL:  The patient is a 65 year old gentleman  in no acute distress.  VITAL SIGNS:  Blood pressure 115/84, pulse is 90 and regular,  respirations are 18, and oxygen saturation is 99% on room air.  LUNGS:  Clear with equal breath sounds bilaterally.  CARDIAC:  Regular rate and rhythm.  Normal S1 and S2.  There is no   murmur.  CHEST:  Sternal incision is healing well.  His sternum is stable.   Chest x-ray shows improved pleural effusions.  There are tiny residual  effusions that are not clinically significant.   IMPRESSION:  The patient is a 65 year old gentleman, status post mitral  valve repair via partial sternotomy for a previous mitral valve  endocarditis.  He is doing extremely well at this point in time.  I  encouraged him to continue to increase his physical activities to see  significant improvements in his exercise tolerance over the next several  weeks.  He is still not to lift any objects greater than 10 pounds at  least for another 2 weeks and nothing over 20 pounds for another 2 weeks  after that.  He may begin driving, appropriate precautions were  discussed.  He is to limit himself to short trips at low speeds and to  be very cautious as he begins driving.  He has a followup appointment  with Dr. Alanda Amass on Monday.  I do think he can come off the Coumadin  in about 6 weeks that is 3 weeks from now  unless Dr. Alanda Amass sees any  other reason to continue that.  I will plan on seeing him back in about  6 months to check on how he is doing.   Edgar Jones, M.D.  Electronically Signed   SCH/MEDQ  D:  06/01/2009  T:  06/02/2009  Job:  [id number]   cc:   Gerlene Burdock A. Alanda Amass, M.D.  Marjory Lies, M.D.  Cliffton Asters, M.D.  Pramod P. Pearlean Brownie, MD

## 2011-03-26 NOTE — Cardiovascular Report (Signed)
[name], Edgar Jones NO.:  [id number]   MEDICAL RECORD NO.:  [id number]          PATIENT TYPE:  INP   LOCATION:  3012                         FACILITY:  [facility]   PHYSICIAN:  Antonieta Iba, MD   DATE OF BIRTH:  [birth year]   DATE OF PROCEDURE:  02/07/2009  DATE OF DISCHARGE:                            CARDIAC CATHETERIZATION   Procedure performed by Dr. Julien Nordmann.   REASON FOR PROCEDURE:  Mr. Philipp is a pleasant 65 year old gentleman  who was admitted to [facility] after presentation of  neurological deficiencies.  CT scan of the head showed a round 2-cm  hemorrhagic region in the left occipital area.  Further evaluation  identified valvular endocarditis.  He is scheduled for valve replacement  and presents to the cath lab for preoperative evaluation for coronary  artery disease.   The risks and benefits were discussed with the patient and consent was  obtained.  He is brought to the cardiac catheterization lab and prepped  and draped in the usual sterile fashion.  The modified Seldinger  technique was used to engage the right femoral artery with a 5-French  Judkins left #4 and right #4 catheter to engage the left main ostium and  the RCA ostium respectively.  Hand-injection of contrast was used to  visualize the coronary anatomy and cinematography recording was  obtained.  LV gram was not performed due to endocarditis and the aortic  valve was not crossed.  The sheath was just removed at the end of the  case and manual pressure held and hemostasis obtained.  There are no  complications at the time of this dictation.  Less than 80 mL of  contrast was used total.   CORONARY ANATOMY:  Left vein, the left vein is a moderate-to-large sized  vessel that bifurcates into the LAD and the left circumflex.  There is  no significant disease noted.   LEFT ANTERIOR DESCENDING:  The LAD is a moderate-to-large sized vessel  that extents distally  around the apex.  There is a moderate-to-large  sized diagonal branch that is bifurcating at its mid region.  There are  also several smaller D2 and D3 branches more distally.  There is no  significant disease noted.   LEFT CIRCUMFLEX:  The left circumflex is a moderate-sized vessel with  OM1 that has a high take off, OM2 that takes off at the mid region and  the distal OM3.  There is no significant disease noted.   RIGHT CORONARY ARTERY:  The RCA is a dominant branch with long distal  PD, PDL, and PL branches.  There is no significant disease noted.   The LV gram was not performed due to the history of endocarditis.   SUMMARY:  Right dominant coronary arterial system with no significant  coronary disease.  The details will be reported back to the CT Surgical  Service.  The patient has a valve replacement surgery pending these  results.     Antonieta Iba, MD  Electronically Signed    TJG/MEDQ  D:  02/07/2009  T:  02/08/2009  Job:  [id number]

## 2011-03-26 NOTE — Op Note (Signed)
NAME:  Edgar Jones, Edgar Jones NO.:  [id number]   MEDICAL RECORD NO.:  [id number]          PATIENT TYPE:  INP   LOCATION:  2311                         FACILITY:  [facility]   PHYSICIAN:  Salvatore Decent. Dorris Fetch, M.D.DATE OF BIRTH:  [birth year]   DATE OF PROCEDURE:  DATE OF DISCHARGE:                               OPERATIVE REPORT   PREOPERATIVE DIAGNOSIS:  Severe mitral regurgitation status post mitral  endocarditis.   POSTOPERATIVE DIAGNOSIS:  Severe mitral regurgitation status post mitral  endocarditis.   PROCEDURES:  Partial sternotomy, extracorporeal circulation, mitral  valve repair with quadrangular resection of P2, and ring annuloplasty  with 32-mm Edwards Physio II annuloplasty ring (model number [id number],  serial number [id number]).   SURGEON:  Salvatore Decent. Dorris Fetch, MD   ASSISTANT:  Coral Ceo, PA   ANESTHESIA:  General.   FINDINGS:  Transesophageal echocardiography showed severe mitral  regurgitation with an anteriorly directed jet due to flail portion of  the posterior leaflet, the junction of P1 and P2, full segment found to  be portion of P2 at the P2P1 junction with ruptured chords, no residual  mitral regurgitation/repair, normal left ventricular function, tiny  patent foramen ovale.   CLINICAL NOTE:  Edgar Jones is a 65 year old gentleman who presented in May  with visual changes.  He was found to have a stroke, subsequently he was  found to be embolic event from mitral valve endocarditis with  Streptococcus bovis.  He was treated with antibiotics for 8 weeks in our  terms for mitral valve repair.  During his initial admission, he had had  cardiac catheterization, which revealed no significant coronary artery  disease.  The indications, risks, benefits, and alternative treatments  were discussed with the patient.  He understood and accepted the risk of  mitral valve repair and agreed to proceed.  He did understand that there  is a small chance of needing a  valve replacement.   OPERATIVE NOTE:  Edgar Jones was brought to the preop holding area on May 10, 2009.  There the Anesthesia Service under the direction of Dr. Kipp Brood placed a Swan-Ganz catheter and arterial blood pressure  monitoring line.  Intravenous antibiotics were administered.  The  patient was taken to the operating room, anesthetized, and intubated.  A  Foley catheter was placed.  Transesophageal echocardiography was  performed including 3-D transesophageal echo.  This revealed flail  portion of the posterior leaflet of the mitral valve near the junction  of P1 and P2 with a severe mitral regurgitation jet directed anteriorly.  There was no other significant valvular pathology.  There was a tiny and  insignificant patent foramen ovale.  There was normal left ventricular  function.  The chest, abdomen, and legs were prepped and draped in usual  fashion.  A midline sternal incision was made.  A partial sternotomy was  performed in an L-shaped sternotomy dividing through the fourth  intercostal space on the right side.  Left hemisternum remained intact.  Hemostasis was achieved.  Retractor was placed, gradually spread over  time.  The patient was heparinized.  The pericardium was opened.  The  aorta was cannulated via concentric 2-0 Ethilon pledgeted pursestring  sutures.  An attempt was made to pass a femoral venous cannula.  The  femoral vein was accessed easily and attempt was made to pass a wire  into the right atrium.  Despite continued attempts, the wire could never  be definitively identified within the right atrium.  Therefore, decision  was made to proceed with a dual-stage venous cannula via pursestring  suture in the right atrium.  Vacuum-assisted venous drainage was  utilized.  After confirming anticoagulation with ACT measurement,  cardiopulmonary bypass was instituted which was maintained per protocol  throughout the procedure.  The patient was cooled to 28  degrees Celsius.  A retrograde cardioplegic cannula was placed via pursestring suture in  the right atrium and draped into the coronary sinus and antegrade  cardioplegic cannula was placed in the ascending aorta.  The aorta was  crossclamped.  The left ventricle was emptied via the aortic root vent.  Cardiac arrest was achieved with a combination of cold antegrade and  retrograde blood cardioplegia and topical iced saline.  Initial 600 mL  of cardioplegia was administered antegrade.  There was a rapid diastolic  arrest.  Myocardial septal cooling to 15 degrees Celsius.  Additional  600 mL of cardioplegia was administered via the retrograde cannula.  This resulted in additional septal cooling to less than 10 degrees  Celsius.  Additional cardioplegia was administered at 20-minute  intervals during the crossclamp.  The interatrial groove was dissected  and the left atrium was opened.  A weighted vent was placed within the  atrium.  Mitral retractor was placed, the mitral valve was exposed.  There were signs of the anterior lead directed jet along the anterior  leaflet and anterior wall of the left atrium.  The valve was inspected,  there was a clear fall segment of P2 on the P1 side, there were ruptured  chords associated with this.  There was no vegetation.  There was  evidence of healed vegetations in the site.  The remainder of P2 was  myxomatous, but did not have significant prolapse.  P1 and P3 did not  have any prolapse whatsoever nor did the anterior leaflet.  The flail  segment of P2 was resected with a quadrangular resection.  This was  approximately half the leaflet surface area of P2.  P1 and P2 then were  reapproximated with interrupted figure-of-eight 4-0 Ethibond sutures.  This was the closure.  There was no prolapse of the remaining portion of  P2.  The anterior leaflet was sized for approximately 32 mm Physio II  annuloplasty ring.  Two Ethibond horizontal mattress sutures  were placed  circumferentially around the mitral annulus, total of 15 sutures were  utilized.  The commissural sutures then were sized and also sized for a  32 mm annuloplasty ring.  The sutures were passed through the sewing  ring of the annuloplasty ring, which was then lowered into place and the  sutures were sequentially tied.  The left ventricle was distended with  iced saline.  There was no residual leak.  The ventricle was then  decompressed.  The left atrial appendage was oversewn with a 4-0 Prolene  suture.  Rewarming was begun.  Left atriotomy was closed in 2 layers  with a running 4-0 Prolene horizontal mattress suture followed by  running 4-0 Prolene simple suture.  The catheter was left across the  mitral valve into the  left ventricle.  During this closure, the left  ventricle then was de-aired as was the left atrium.  Catheter was  removed and the sutures were tied.  The patient was placed in  Trendelenburg position.  Lidocaine was administered.  Additional de-  airing was performed and aortic crossclamp was removed.  Total  crossclamp time was 111 minutes.  The patient spontaneously resumed a  bradycardic rhythm and did not require defibrillation.  While the  patient is rewarming, the atriotomy was inspected for hemostasis.  Epicardial pacing wires were placed on the right ventricle and right  atrium.  When the patient had rewarmed to a core temperature of 37  degrees Celsius, a low-dose dopamine infusion was initiated.  Additional  de-airing was performed with a needle through the left apex.  The  patient then was weaned from cardiopulmonary bypass on the first  attempt.  The total bypass time was 144 minutes.  Initial cardiac index  was greater than 2 L per minute per meter squared.  Postbypass  transesophageal echocardiography revealed preserved left ventricular  function.  No residual air.  There was no residual mitral regurgitation.  There was no evidence of systolic  anterior motion.   A test dose of protamine was administered and was well tolerated.  The  atrial and aortic cannulae were removed.  Remainder of protamine was  administered without incident.  The chest was irrigated with 1 mL of  warm normal saline.  Hemostasis was achieved.  Pericardium was  reapproximated with interrupted 3-0 silk sutures.  A single mediastinal  chest tube was placed.  The sternum was then closed using single and  double heavy gauge stainless steel  wires.  Pectoralis fascia, subcutaneous tissue, and skin were closed in  standard fashion.  All sponge, needle, and sponge counts were correct at  the end of procedure.  There were no intraoperative complications.  The  patient was taken from the operating room to the surgical intensive care  unit in good condition.      Salvatore Decent Dorris Fetch, M.D.  Electronically Signed     Salvatore Decent. Dorris Fetch, M.D.  Electronically Signed    SCH/MEDQ  D:  05/10/2009  T:  05/11/2009  Job:  [id number]   cc:   Marjory Lies, M.D.  Richard A. Alanda Amass, M.D.  Cliffton Asters, M.D.  Michael L. Thad Ranger, M.D.

## 2011-03-26 NOTE — Procedures (Signed)
NAME:  Edgar Jones, Edgar Jones NO.:  [id number]   MEDICAL RECORD NO.:  [id number]          PATIENT TYPE:  OUT   LOCATION:  SLEEP CENTER                 FACILITY:  [facility]   PHYSICIAN:  Clinton D. Maple Hudson, MD, FCCP, FACPDATE OF BIRTH:  03-20-46   DATE OF STUDY:  07/05/2009                            NOCTURNAL POLYSOMNOGRAM   REFERRING PHYSICIAN:   REFERRING PHYSICIAN:  Viviann Spare C. Dorris Fetch, MD   INDICATIONS FOR STUDY:  Hypersomnia with sleep apnea.   EPWORTH SLEEPINESS SCORE:  Epworth sleepiness score 14/24, BMI 25.7.  Weight 200 pounds, height 74 inches.  Neck 17 inches.   HOME MEDICATIONS:  Charted and reviewed.   SLEEP ARCHITECTURE:  Total sleep time 393 minutes with sleep efficiency  84.7%.  Stage I was 3.3%, stage II 68.3%, stage III absent, REM 28.4% of  total sleep time.  Sleep latency 22.5 minutes, REM latency 59 minutes,  awake after sleep onset 48.5 minutes, arousal index 24.9.  No bedtime  medication was taken.   RESPIRATORY DATA:  Apnea/hypopnea index (AHI) 11.9 per hour.  A total of  78 events were scored including 26 obstructive apneas, 4 central apneas,  2 mixed apneas, and 46 hypopneas.  Events were noted in all sleep  positions, but more common while supine.  REM AHI 25.3 per hour.  An  additional 10 respiratory effort related arousals not meeting intensity  and duration, criteria to be scored as apneas or hypopneas, contributed  to a respiratory disturbance index (RDI) cumulative of 13.4 per hour.  This was a diagnostic NPSG study as ordered.   OXYGEN DATA:  Moderate snoring with oxygen desaturation to a nadir of  84%.  Mean oxygen saturation through the study was 92.9% on room air.  A  total of 1.3 minutes was recorded with oxygen saturation less than 88%  on room air.   CARDIAC DATA:  Sinus rhythm.   MOVEMENT/PARASOMNIA:  Occasional limb jerks with a total of 14 recorded  of which 8 were associated with arousal or awakening for periodic limb  movement with arousal index of 1.2 per hour.  Bathroom x2.   IMPRESSION/RECOMMENDATIONS:  1. 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  Most events were obstructive apneas and hypopneas      associated with supine sleep position and with REM.  Moderate-to-      moderately loud snoring with oxygen desaturation transiently to a      nadir of 84% on room air.  2. Scores in this range may respond to conservative treatment, but may      also be appropriately addressed with CPAP.  Consider return for      CPAP titration, or evaluate for alternative management as      clinically appropriate.      Clinton D. Maple Hudson, MD, [facility], FACP  Diplomate, Biomedical engineer of Sleep Medicine  Electronically Signed     CDY/MEDQ  D:  07/08/2009 16:59:28  T:  07/09/2009 01:52:36  Job:  [id number]

## 2011-03-26 NOTE — Assessment & Plan Note (Signed)
OFFICE VISIT   GENERAL, WEARING  DOB:  03/12/1946                                        December 18, 2009  CHART #:  [id number]   HISTORY OF PRESENT ILLNESS:  The patient is a 65 year old gentleman who  had mitral valve endocarditis.  He presented with a hemorrhagic stroke  in May 2010.  He was treated with antibiotics and subsequently had  mitral valve repair on June 30.  I last saw him in the office on July  22, at which time he was doing well.  He had an echocardiogram in  August, I do not have the report from that, but according to Dr.  Kandis Cocking note from October, the echo showed stable mitral valve with  trace regurgitation and a normal ejection fraction.  The patient says  that he has been doing well.  He is tired a lot.  He is using CPAP at  night when he sleeps, but he still is tired.  He says he will fall  asleep pretty quickly after sitting down in the evenings.  Of course, he  has been working 10-12 hours a day.  He is in Air[employer] for [employer].  He  has not had chest pain, shortness of breath.  He says that he is not  aware of any residual from his embolic stroke, but he thinks he may have  adjusted or adapted to the visual field loss that he had presented with  initially.  He is not having any pain in his sternotomy incision, any  shortness of breath.   CURRENT MEDICATIONS:  1. Nexium 40 mg daily.  2. Benazepril 10 mg daily.  3. Simvastatin 40 mg daily.  4. Norvasc 2.5 mg daily.  5. Aspirin 325 mg daily.   PHYSICAL EXAMINATION:  The patient is a 65 year old gentleman in no  acute distress.  His blood pressure is 120/81, pulse is 76 and regular,  respirations are 16, oxygen saturation is 97% on room air.  His lungs  are clear with equal breath sounds.  Cardiac exam has regular rate and  rhythm.  Normal S1 and S2.  There are no rubs, murmurs, or gallops.  Sternotomy incision is well healed.  There is no peripheral edema.   IMPRESSION:  The  patient is a 65 year old gentleman status post mitral  valve repair for endocarditis.  He is doing well at this point in time.  I do not really have anything to add at this time.  He is on appropriate  medical therapy.  He does not need anticoagulation other than an aspirin  a day which he is on.  He is continued to be followed by Dr. Alanda Amass  from a cardiac standpoint.  I would be happy to see him back any time in  the future if I can be of any further assistance with his care.  The  patient knows to call if he has any questions that I can be of  assistance with.    Salvatore Decent Dorris Fetch, M.D.  Electronically Signed   SCH/MEDQ  D:  12/18/2009  T:  12/19/2009  Job:  [id number]   cc:   Gerlene Burdock A. Alanda Amass, M.D.  Marjory Lies, M.D.  Cliffton Asters, M.D.  Pramod P. Pearlean Brownie, MD

## 2011-03-26 NOTE — Discharge Summary (Signed)
NAME:  Edgar Jones, Edgar Jones NO.:  [id number]   MEDICAL RECORD NO.:  [id number]          PATIENT TYPE:  INP   LOCATION:  2009                         FACILITY:  [facility]   PHYSICIAN:  Salvatore Decent. Dorris Fetch, M.D.DATE OF BIRTH:  [birth year]   DATE OF ADMISSION:  05/10/2009  DATE OF DISCHARGE:                               DISCHARGE SUMMARY   PRIMARY ADMITTING DIAGNOSES:  1. Severe mitral regurgitation.  2. History of Streptococcus endocarditis.   ADDITIONAL/DISCHARGE DIAGNOSES:  1. Severe mitral regurgitation.  2. Streptococcus endocarditis.  3. History of hemorrhagic stroke.  4. Postoperative anemia.  5. Postoperative bradycardia.  6. Hypertension.  7. Hyperlipidemia.  8. History of questionable Barrett esophagus, status post esophageal      dilatation.   PROCEDURES PERFORMED:  Mitral valve repair with quadrangular resection  of T2 and 32-mm Edwards Physio II ring annuloplasty.   HISTORY:  The patient is a 65 year old male who presented in May 2010  with a hemorrhagic stroke.  This was found to be secondary to an embolic  event from mitral valve endocarditis with Streptococcus bovis.  He was  treated with an 8-week course of antibiotics and his neurologic symptoms  have progressively improved.  On echocardiogram, he has 4+ mitral  regurgitation, although he has been well compensated clinically with no  evidence of heart failure.  He has seen Dr. Charlett Lango both in  the hospital and as an outpatient consultation and is recommended at  this time that he proceed with elective mitral valve repair or  replacement.  All risks, benefits and alternatives of the surgery have  been explained to the patient and he agreed to proceed.   HOSPITAL COURSE:  Edgar Jones was admitted to [facility] on May 10, 2009 and underwent mitral valve repair by Dr. Dorris Fetch as  described above.  Please see previously dictated operative report for  complete details of surgery.   He tolerated the procedure well and was  transferred to the SICU in stable condition.  He was able to be  extubated shortly after surgery.  He was hemodynamically stable and  doing well on postop day #1.  At that time, his chest tubes and lines  were removed.  He was transferred to the step-down unit.  He was not  initially started on a beta-blocker secondary to bradycardia, which  required AAI pacing.  Over the course of the next 48 hours, his pacer  was turned down and his intrinsic rhythm was sinus with heart rates in  the 60s to 80s.  His pacer was subsequently discontinued and he has been  maintaining sinus rhythm since that time.  His blood pressure has  started to trend upward and he has been started back on benazepril.  He  has also been started on Coumadin low dose for his mitral valve repair.  At present, his INR is 1.1.  He is otherwise doing well postoperatively.  He has remained afebrile and his vital signs have been stable.  He has  been ambulating in the halls without problem.  He is tolerating a  regular diet and having normal bowel and bladder function.   His most recent labs show sodium 133, potassium 3.9, BUN 20, creatinine  1.27.  Hemoglobin 9.7, hematocrit 28.2, white count 10.5, platelets 123.  He has continued to progress nicely postoperatively.  It was felt that  if he remained stable over the next 24 hours, he will hopefully be ready  for discharge home on or about May 15, 2009.   DISCHARGE MEDICATIONS:  1. Coumadin, home dose will be determined by PT and INR drawn on the      date of discharge.  2. Benazepril 20 mg daily.  3. Oxycodone 5 mg one to two q.4-6 h. p.r.n. for pain.  4. Nexium 40 mg daily.  5. Simvastatin 40 mg daily.  6. Calcium 600 plus D one daily.  7. Aspirin 325 mg daily.   DISCHARGE INSTRUCTIONS:  He was asked to refrain from driving, heavy  lifting, or strenuous activity.  He may continue ambulating daily and  using his incentive  spirometer.  He may shower daily and clean his  incisions with soap and water.  He will continue his same preoperative  diet.   DISCHARGE FOLLOWUP:  He will need to make an appointment to see Dr.  Alanda Amass in 2 weeks.  He will then follow up in 3 weeks with Dr.  Dorris Fetch with a chest x-ray.  In the interim if he experiences any  problems or has questions, he is asked to contact our office.      Coral Ceo, P.A.      Salvatore Decent Dorris Fetch, M.D.  Electronically Signed    GC/MEDQ  D:  05/14/2009  T:  05/14/2009  Job:  [id number]   cc:   Gerlene Burdock A. Alanda Amass, M.D.  Marjory Lies, M.D.  Pramod P. Pearlean Brownie, MD  Georgiana Spinner, M.D.  Cliffton Asters, M.D.

## 2011-03-26 NOTE — Op Note (Signed)
NAME:  Edgar Jones, Edgar Jones NO.:  [id number]   MEDICAL RECORD NO.:  [id number]          PATIENT TYPE:  AMB   LOCATION:  ENDO                         FACILITY:  [facility]   PHYSICIAN:  Georgiana Spinner, M.D.    DATE OF BIRTH:  03/03/1946   DATE OF PROCEDURE:  DATE OF DISCHARGE:                               OPERATIVE REPORT   PROCEDURE:  Colonoscopy.   INDICATIONS:  Colon cancer screening.  Family history of colon polyps.   ANESTHESIA:  Fentanyl 25 mcg, Versed 2 mg.   PROCEDURE:  With the patient mildly sedated in the left lateral  decubitus position, the Pentax videoscopic colonoscope was inserted in  the rectum after a normal rectal exam was performed, passed under direct  vision to the cecum, identified by ileocecal valve and appendiceal  orifice.  Pressure was applied to the abdomen to reach the cecum.  From  this point the colonoscope was slowly withdrawn, taking circumferential  views of colonic mucosa,  stopping only in the rectum which appeared  normal and the rectum showed hemorrhoids on retroflex view.  The  endoscope was straightened, withdrawn.  The patient's vital signs, pulse  oximeter remained stable.  The patient tolerated procedure well without  apparent complications.   FINDINGS:  Internal hemorrhoids, otherwise a negative examination.   PLAN:  Repeat examination possibly in 5 years           ______________________________  Georgiana Spinner, M.D.     GMO/MEDQ  D:  03/24/2008  T:  03/24/2008  Job:  [id number]   cc:   Marjory Lies, M.D.  Fax: [phone]

## 2011-03-29 NOTE — Op Note (Signed)
NAME:  Edgar Jones, Edgar Jones                           ACCOUNT NO.:  [id number]   MEDICAL RECORD NO.:  [id number]                   PATIENT TYPE:  AMB   LOCATION:  ENDO                                 FACILITY:  [facility]   PHYSICIAN:  Georgiana Spinner, M.D.                 DATE OF BIRTH:  26-Jun-1946   DATE OF PROCEDURE:  04/04/2004  DATE OF DISCHARGE:                                 OPERATIVE REPORT   PROCEDURE:  Upper endoscopy with biopsy.   INDICATIONS FOR PROCEDURE:  Gastroesophageal reflux disease.   ANESTHESIA:  Demerol 40, Versed 4 mg.   DESCRIPTION OF PROCEDURE:  With the patient mildly sedated in the left  lateral decubitus position, the Olympus videoscopic endoscope was inserted  in the mouth and passed under direct vision through the esophagus which  appeared normal until it reached the distal esophagus and there was the  beginning of a recurrence of a stricture that we had previously dilated. We  advanced slightly pass this and there was two areas of Barrett's esophagus  seen, one isthmus of tissue on the distal wall, a small island of Barrett's  on the proximal wall of the esophagus.  A photograph was taken and biopsies  of each area were taken.  We entered into the stomach.  The fundus, body,  antrum, duodenal bulb and second portion of the duodenum were visualized  which appeared normal.  From this point, the endoscope was slowly withdrawn  taking circumferential views of the duodenal mucosa until the endoscope was  then pulled back in the stomach, placed in retroflexion to view the stomach  from below. The endoscope was then straightened and withdrawn taking  circumferential views of the remaining gastric and esophageal mucosa  stopping in the mid esophagus where a papilloma was seen and removed with  biopsy forceps.  The endoscope was then withdrawn.  The patient's vital  signs and pulse oximeter remained stable. The patient tolerated the  procedure well without apparent  complications.   FINDINGS:  Early recurrence of stricture of the distal esophagus. Changes of  Barrett's esophagus biopsied. 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   PLAN:  Await biopsy report and clinical response. The patient will call me  for results and followup with me as an outpatient.   Of note, the patient states that his dysphagia is much better since dilation  a year ago.                                               Georgiana Spinner, M.D.    GMO/MEDQ  D:  04/04/2004  T:  04/04/2004  Job:  [id number]   cc:   Marjory Lies, M.D.  [address]  [place]  [place] [address]  Fax: [phone]

## 2011-03-29 NOTE — Op Note (Signed)
   NAME:  Edgar Jones, Edgar Jones                           ACCOUNT NO.:  [id number]   MEDICAL RECORD NO.:  [id number]                   PATIENT TYPE:  AMB   LOCATION:  ENDO                                 FACILITY:  [facility]   PHYSICIAN:  Georgiana Spinner, M.D.                 DATE OF BIRTH:  [birth year]   DATE OF PROCEDURE:  10/13/2002  DATE OF DISCHARGE:                                 OPERATIVE REPORT   PROCEDURE:  Colonoscopy.   ENDOSCOPIST:  Georgiana Spinner, M.D.   INDICATIONS:   ANESTHESIA:  Demerol 50 mg, Versed 4 mg.   DESCRIPTION OF PROCEDURE:  With the patient mildly sedated in the left  lateral decubitus position, the Olympus videoscopic colonoscope was inserted  in the rectum  after normal rectal exam and passed under direct vision to  the cecum, identified by ileocecal valve and appendiceal orifice.  From this  point, the colonoscope was slowly withdrawn taken circumferential views of  the entire colonic mucosa only stopping in the rectum which appeared normal  on direct and retroflexed view.  The endoscope hemorrhoids.  The endoscope  was straightened and withdrawn.  The  patient's vital signs and pulse  oximeter remained stable.  The patient tolerated the procedure well without  apparent complications.   FINDINGS:  Internal hemorrhoids, otherwise unremarkable exam.   PLAN:  Repeat examination in approximately 5-10 years.                                               Georgiana Spinner, M.D.    GMO/MEDQ  D:  10/13/2002  T:  10/13/2002  Job:  [id number]   cc:   Gloriajean Dell. Andrey Campanile, M.D.

## 2011-03-29 NOTE — Op Note (Signed)
   NAME:  Edgar Jones, Edgar Jones                           ACCOUNT NO.:  [id number]   MEDICAL RECORD NO.:  [id number]                   PATIENT TYPE:  AMB   LOCATION:  ENDO                                 FACILITY:  [facility]   PHYSICIAN:  Georgiana Spinner, M.D.                 DATE OF BIRTH:  06-03-1946   DATE OF PROCEDURE:  DATE OF DISCHARGE:                                 OPERATIVE REPORT   PROCEDURE:  Upper endoscopy.   INDICATIONS FOR PROCEDURE:  Dysphagia.   ANESTHESIA:  Demerol 50, Versed 5 mg.   DESCRIPTION OF PROCEDURE:  With the patient mildly sedated in the left  lateral decubitus position, the Olympus videoscopic endoscope was inserted  in the mouth and passed under direct vision through the esophagus which  showed changes of esophagitis and diffuse inflammatory process. We reached  the distal esophagus and there were some changes there above a stricture  which was photographed. We entered into the stomach. The fundus, body,  antrum, duodenal bulb, and second portion of the duodenum were visualized.  From this point, the endoscope was slowly withdrawn taking circumferential  views of the duodenal mucosa until the endoscope was then pulled back into  the stomach, placed in retroflexion to view the stomach from below. The  endoscope was then straightened and a guidewire was passed, endoscope  removed and over the guidewire was passed 15, 17 and 18 Savary dilators  easily. With the latter, the guidewire was removed. The endoscope was  reinserted, a small amount of bleeding was seen as to be expected with  dilation of this stricture but the area of the distal esophagus whether it  ws possibly esophagitis versus Barrett's could not be well visualized  because of the blood staining; therefore, the endoscope was withdrawn. The  patient's vital signs and pulse oximeter remained stable. The patient  tolerated the procedure well without apparent complications.   FINDINGS:  Changes of  esophagitis and/or Barrett's esophagus with a  stricture dilated to 15, 17 and 18 dilation.   PLAN:  Await clinical response. The patient will follow-up with me as an  outpatient and proceed to colonoscopy as planned.                                                 Georgiana Spinner, M.D.    GMO/MEDQ  D:  10/13/2002  T:  10/13/2002  Job:  [id number]   cc:   Gloriajean Dell. Andrey Campanile, M.D.

## 2011-03-29 NOTE — Op Note (Signed)
NAME:  Edgar Jones, Edgar Jones NO.:  [id number]   MEDICAL RECORD NO.:  [id number]          PATIENT TYPE:  AMB   LOCATION:  ENDO                         FACILITY:  [facility]   PHYSICIAN:  Georgiana Spinner, M.D.    DATE OF BIRTH:  06-07-1946   DATE OF PROCEDURE:  06/11/2006  DATE OF DISCHARGE:                                 OPERATIVE REPORT   PROCEDURE:  Upper endoscopy with dilation and biopsy.   INDICATIONS:  Dysphagia.   ANESTHESIA:  Demerol 40 mg, Versed 5 mg.   PROCEDURE:  With the patient mildly sedated in the left lateral decubitus  position, the Olympus videoscopic endoscope was inserted and passed under  direct vision through the esophagus which appeared normal until we reached  distal esophagus and there was an area of Barrett's esophagus and a fairly  tight esophageal stricture that was photographed.  We then entered into the  stomach fundus, body, antrum, duodenal bulb, second portion duodenum were  visualized.  From this point the endoscope was slowly withdrawn taking  circumferential views of duodenal mucosa until the endoscope had been pulled  back into the stomach placed in retroflexion to view the stomach from below.  The endoscope was then straightened and a guidewire was passed. The  endoscope was withdrawn.  Subsequently Savary dilators 15, 16 and 17 were  passed under fluoroscopic guidance rather easily.  With the latter, I  elected to withdraw the guidewire with the dilator and we then reinserted  the endoscope and it was noted that we had fractured the stricture as  planned.  We then took biopsies from the areas of what appeared to be  Barrett's esophagus and withdrew the endoscope.  The patient's vital signs,  pulse oximeter remained stable.  The patient tolerated procedure well  without apparent complications.   FINDINGS:  Barrett's esophagus with a esophageal stricture dilated, await  clinical response and biopsy reports.  The patient will call me for  results  and follow-up with me as an outpatient.           ______________________________  Georgiana Spinner, M.D.     GMO/MEDQ  D:  06/11/2006  T:  06/11/2006  Job:  [id number]

## 2011-04-30 ENCOUNTER — Ambulatory Visit (AMBULATORY_SURGERY_CENTER): Payer: BC Managed Care – PPO

## 2011-04-30 VITALS — Ht 73.0 in | Wt 203.5 lb

## 2011-04-30 DIAGNOSIS — R1310 Dysphagia, unspecified: Secondary | ICD-10-CM

## 2011-04-30 DIAGNOSIS — K2270 Barrett's esophagus without dysplasia: Secondary | ICD-10-CM

## 2011-05-01 ENCOUNTER — Encounter: Payer: Self-pay | Admitting: Gastroenterology

## 2011-05-10 ENCOUNTER — Other Ambulatory Visit: Payer: Self-pay | Admitting: Gastroenterology

## 2011-05-22 ENCOUNTER — Ambulatory Visit (AMBULATORY_SURGERY_CENTER): Payer: BC Managed Care – PPO | Admitting: Gastroenterology

## 2011-05-22 ENCOUNTER — Encounter: Payer: Self-pay | Admitting: Gastroenterology

## 2011-05-22 VITALS — BP 104/72 | HR 55 | Temp 97.1°F | Resp 20 | Ht 73.0 in | Wt 205.0 lb

## 2011-05-22 DIAGNOSIS — K2270 Barrett's esophagus without dysplasia: Secondary | ICD-10-CM

## 2011-05-22 DIAGNOSIS — R1310 Dysphagia, unspecified: Secondary | ICD-10-CM

## 2011-05-22 DIAGNOSIS — R933 Abnormal findings on diagnostic imaging of other parts of digestive tract: Secondary | ICD-10-CM

## 2011-05-22 DIAGNOSIS — K219 Gastro-esophageal reflux disease without esophagitis: Secondary | ICD-10-CM

## 2011-05-22 MED ORDER — SODIUM CHLORIDE 0.9 % IV SOLN: 500.0000 mL | INTRAVENOUS | Status: DC

## 2011-05-22 NOTE — Patient Instructions (Signed)
See blue and green discharge instruction sheets.

## 2011-05-23 ENCOUNTER — Telehealth: Payer: Self-pay | Admitting: *Deleted

## 2011-05-23 NOTE — Telephone Encounter (Signed)
No ID on voice mail.   No message left.

## 2011-11-09 ENCOUNTER — Other Ambulatory Visit: Payer: Self-pay | Admitting: Gastroenterology

## 2012-10-19 ENCOUNTER — Other Ambulatory Visit: Payer: Self-pay | Admitting: Gastroenterology

## 2013-02-01 ENCOUNTER — Other Ambulatory Visit (HOSPITAL_COMMUNITY): Payer: Self-pay | Admitting: Cardiovascular Disease

## 2013-02-01 DIAGNOSIS — Z9889 Other specified postprocedural states: Secondary | ICD-10-CM

## 2013-02-01 DIAGNOSIS — I340 Nonrheumatic mitral (valve) insufficiency: Secondary | ICD-10-CM

## 2013-02-08 ENCOUNTER — Other Ambulatory Visit: Payer: Self-pay | Admitting: Gastroenterology

## 2013-02-10 ENCOUNTER — Ambulatory Visit (HOSPITAL_COMMUNITY)
Admission: RE | Admit: 2013-02-10 | Discharge: 2013-02-10 | Disposition: A | Discharge status: Home / Self Care | Payer: Medicare Other | Source: Ambulatory Visit | Attending: Cardiovascular Disease | Admitting: Cardiovascular Disease

## 2013-02-10 DIAGNOSIS — Z9289 Personal history of other medical treatment: Secondary | ICD-10-CM

## 2013-02-10 DIAGNOSIS — I080 Rheumatic disorders of both mitral and aortic valves: Secondary | ICD-10-CM | POA: Insufficient documentation

## 2013-02-10 DIAGNOSIS — I079 Rheumatic tricuspid valve disease, unspecified: Secondary | ICD-10-CM | POA: Insufficient documentation

## 2013-02-10 DIAGNOSIS — Z9889 Other specified postprocedural states: Secondary | ICD-10-CM

## 2013-02-10 DIAGNOSIS — I340 Nonrheumatic mitral (valve) insufficiency: Secondary | ICD-10-CM

## 2013-02-10 DIAGNOSIS — R011 Cardiac murmur, unspecified: Secondary | ICD-10-CM | POA: Insufficient documentation

## 2013-02-10 DIAGNOSIS — I379 Nonrheumatic pulmonary valve disorder, unspecified: Secondary | ICD-10-CM | POA: Insufficient documentation

## 2013-02-10 HISTORY — DX: Personal history of other medical treatment: Z92.89

## 2013-02-10 NOTE — Progress Notes (Signed)
2D Echo Performed 02/10/2013    Tamla Winkels, RCS  

## 2013-03-18 ENCOUNTER — Other Ambulatory Visit: Payer: Self-pay | Admitting: Gastroenterology

## 2013-03-26 ENCOUNTER — Telehealth: Payer: Self-pay | Admitting: Cardiovascular Disease

## 2013-03-26 NOTE — Telephone Encounter (Signed)
Need a prescription refill on; Simvastatin-80mg  , but would like to get it changed to 40 mg.[facility] [place][phone].

## 2013-03-26 NOTE — Telephone Encounter (Signed)
Spoke  with patient informing him that his message was received and his refile request should be ready for pick up today.

## 2013-10-18 ENCOUNTER — Telehealth: Payer: Self-pay

## 2013-10-18 MED ORDER — OMEPRAZOLE 40 MG PO CPDR: 40.0000 mg | DELAYED_RELEASE_CAPSULE | Freq: Every day | ORAL | Status: DC

## 2013-10-18 NOTE — Telephone Encounter (Signed)
Rx was sent to pharmacy electronically. 

## 2013-10-19 ENCOUNTER — Other Ambulatory Visit: Payer: Self-pay | Admitting: *Deleted

## 2013-10-19 MED ORDER — SIMVASTATIN 40 MG PO TABS: 40.0000 mg | ORAL_TABLET | Freq: Every day | ORAL | Status: DC

## 2013-11-23 ENCOUNTER — Ambulatory Visit (INDEPENDENT_AMBULATORY_CARE_PROVIDER_SITE_OTHER): Payer: Medicare Other | Admitting: Cardiovascular Disease

## 2013-11-23 VITALS — BP 120/80 | HR 57 | Ht 73.0 in | Wt 222.0 lb

## 2013-11-23 DIAGNOSIS — R5381 Other malaise: Secondary | ICD-10-CM

## 2013-11-23 DIAGNOSIS — R5383 Other fatigue: Secondary | ICD-10-CM

## 2013-11-23 DIAGNOSIS — E785 Hyperlipidemia, unspecified: Secondary | ICD-10-CM

## 2013-11-23 DIAGNOSIS — Z9889 Other specified postprocedural states: Secondary | ICD-10-CM

## 2013-11-23 DIAGNOSIS — I059 Rheumatic mitral valve disease, unspecified: Secondary | ICD-10-CM

## 2013-11-23 DIAGNOSIS — K219 Gastro-esophageal reflux disease without esophagitis: Secondary | ICD-10-CM

## 2013-11-23 DIAGNOSIS — I10 Essential (primary) hypertension: Secondary | ICD-10-CM

## 2013-11-23 DIAGNOSIS — I341 Nonrheumatic mitral (valve) prolapse: Secondary | ICD-10-CM

## 2013-11-23 DIAGNOSIS — Z79899 Other long term (current) drug therapy: Secondary | ICD-10-CM

## 2013-11-23 LAB — CBC
HEMATOCRIT: 43.1 % (ref 39.0–52.0)
HEMOGLOBIN: 14.4 g/dL (ref 13.0–17.0)
MCH: 30.1 pg (ref 26.0–34.0)
MCHC: 33.4 g/dL (ref 30.0–36.0)
MCV: 90.2 fL (ref 78.0–100.0)
Platelets: 271 10*3/uL (ref 150–400)
RBC: 4.78 MIL/uL (ref 4.22–5.81)
RDW: 13.8 % (ref 11.5–15.5)
WBC: 6.4 10*3/uL (ref 4.0–10.5)

## 2013-11-23 NOTE — Patient Instructions (Signed)
Your physician recommends that you schedule a follow-up appointment in: 6 Months  Your physician has requested that you have an echocardiogram. Echocardiography is a painless test that uses sound waves to create images of your heart. It provides your doctor with information about the size and shape of your heart and how well your heart's chambers and valves are working. This procedure takes approximately one hour. There are no restrictions for this procedure. 6 Months  Your physician recommends that you return for lab work CBC, CMP, LIPIDS, TSH

## 2013-11-24 LAB — COMPREHENSIVE METABOLIC PANEL
ALBUMIN: 4.3 g/dL (ref 3.5–5.2)
ALK PHOS: 43 U/L (ref 39–117)
ALT: 21 U/L (ref 0–53)
AST: 19 U/L (ref 0–37)
BUN: 13 mg/dL (ref 6–23)
CALCIUM: 9.5 mg/dL (ref 8.4–10.5)
CO2: 28 mEq/L (ref 19–32)
CREATININE: 0.92 mg/dL (ref 0.50–1.35)
Chloride: 104 mEq/L (ref 96–112)
GLUCOSE: 77 mg/dL (ref 70–99)
POTASSIUM: 4.8 meq/L (ref 3.5–5.3)
Sodium: 139 mEq/L (ref 135–145)
Total Bilirubin: 0.5 mg/dL (ref 0.3–1.2)
Total Protein: 6.9 g/dL (ref 6.0–8.3)

## 2013-11-24 LAB — LIPID PANEL
Cholesterol: 189 mg/dL (ref 0–200)
HDL: 39 mg/dL — ABNORMAL LOW (ref >39)
LDL Cholesterol: 105 mg/dL — ABNORMAL HIGH (ref 0–99)
Total CHOL/HDL Ratio: 4.8 Ratio
Triglycerides: 224 mg/dL — ABNORMAL HIGH (ref <150)
VLDL: 45 mg/dL — ABNORMAL HIGH (ref 0–40)

## 2013-11-24 LAB — TSH: TSH: 1.766 u[IU]/mL (ref 0.350–4.500)

## 2013-12-14 ENCOUNTER — Telehealth: Payer: Self-pay | Admitting: Cardiovascular Disease

## 2013-12-14 NOTE — Telephone Encounter (Signed)
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

## 2013-12-14 NOTE — Telephone Encounter (Signed)
Returned call and pt verified x 2.  Pt informed message received and as of today, there is no letter in his file.  Pt stated he dropped a letter to Dr. Tresa EndoKelly off a few weeks ago and hasn't heard anything back yet.  Pt informed Dr. Pierre BaliKelly/Wanda, CMA will be notified as they likely have his paper chart with the request.  Pt informed he should be notified once complete.  Pt verbalized understanding and agreed w/ plan.  Stated if he needs to be seen again, then let him know.  Message forwarded to Dr. Pierre BaliKelly/Wanda, CMA.

## 2013-12-18 ENCOUNTER — Encounter: Payer: Self-pay | Admitting: Cardiovascular Disease

## 2013-12-18 DIAGNOSIS — Z9889 Other specified postprocedural states: Secondary | ICD-10-CM | POA: Insufficient documentation

## 2013-12-18 NOTE — Progress Notes (Signed)
Patient ID: Edgar Jones, male   DOB: 06-Nov-1946, 68 y.o.   MRN: [id number]     PATIENT PROFILE: [name] is a 60 for a patient of Dr. Rollene Fare who presents to the office today to establish cardiology care with me after Dr. Lowella Fairy retirement from a solo practice.   HPI: [name] underwent mitral valve repair or in June 2010 with a 3 mm Edwards and you plasty ring and quadrangular resection of P2 segment of the mitral valve which was done by Dr. Roxan Hockey. 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 At that time he had normal coronary arteries. The patient also has a history of obstructive sleep apnea since 2010 and has been on CPAP therapy and admits to 100% use. There is also a history of hypertension as well as hyperlipidemia in addition to GERD.  He last saw Dr. Rollene Fare in March 2014.  Presently, he denies any episodes of chest pain. He notes mild episodes intermittently and feels at times she is unable to get a deep breath. His last echo Doppler study was done in April 2014 which confirmed normal systolic function with an ejection fraction of 55-60%. Left atrium was mildly dilated. His mitral valve repair and anoplasty wearing were stable there was no evidence for prolapse and only trivial regurgitation. He had mild TR.  Past Medical History  Diagnosis Date  . GERD (gastroesophageal reflux disease)   . Heart murmur   . Endocarditis   . Stroke     Past Surgical History  Procedure Laterality Date  . Mitral valve repair    . Inguinal hernia repair      left side  . Hernia repair      right abd  . Upper gastrointestinal endoscopy    . Polypectomy    . Colonoscopy      No Known Allergies  Current Outpatient Prescriptions  Medication Sig Dispense Refill  . aspirin EC 81 MG tablet Take 81 mg by mouth daily.      . benazepril (LOTENSIN) 10 MG tablet Take 10 mg by mouth daily.        Marland Kitchen omeprazole  (PRILOSEC) 40 MG capsule Take 1 capsule (40 mg total) by mouth daily.  30 capsule  3  . simvastatin (ZOCOR) 40 MG tablet Take 1 tablet (40 mg total) by mouth at bedtime. Need appointment until further refils  30 tablet  2   Current Facility-Administered Medications  Medication Dose Route Frequency Provider Last Rate Last Dose  . 0.9 %  sodium chloride infusion  500 mL Intravenous Continuous Milus Banister, MD        Socially he is married has 3 children and 2 grandchildren. There is a remote tobacco history but he quit over 30 years ago. 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 He is able to exercise without restriction. He does play golf and rides a bike.  Family History  Problem Relation Age of Onset  . Heart disease Mother   . Heart disease Father     ROS is negative for fever chills or night sweats. He denies change in weight. He denies visual changes. There is no difficulty with hearing. He is unaware of palpitations, PND or orthopnea. There is no wheezing, increased sputum production or chest congestion. He does have a history of hypertension and hyperlipidemia. There is no chest pain.  He denies abdominal pain nausea or vomiting. He does take omeprazole for GERD. He denies change in GU symptoms. There is no blood in stool or urine. He denies claudication. He denies edema. There is no diabetes. There is no thyroid abnormalities. He admits to 100% compliance with the CPAP therapy for his obstructive sleep apnea. Other comprehensive 14 point system review is negative.  PE BP 120/80  Pulse 57  Ht '6\' 1"'  (1.854 m)  Wt 222 lb (100.699 kg)  BMI 29.30 kg/m2 General: Alert, oriented, no distress.  Skin: normal turgor, no rashes HEENT: Normocephalic, atraumatic. Pupils round and reactive; sclera anicteric; extraocular muscles intact; Fundi without hemorrhages or exudates. No xanthelasmas Nose without nasal septal  hypertrophy Mouth/Parynx benign; Mallinpatti scale Neck: No JVD, no carotid bruits; normal carotid upstroke Lungs: clear to ausculatation and percussion; no wheezing or rales Chest wall: without tenderness to palpitation Heart: RRR, s1 s2 normal 2/6 systolic murmur at the left sternal border radiating to the apex. No S3 or S4 gallop. Abdomen: soft, nontender; no hepatosplenomehaly, BS+; abdominal aorta nontender and not dilated by palpation. Back: no CVA tenderness Pulses 2+ Extremities: no clubbinbg cyanosis or edema, Homan's sign negative  Neurologic: grossly nonfocal; Cranial nerves grossly wnl Psychologic: Normal mood and affect    ECG (independently read by me): Sinus bradycardia with very mild first-degree block with a PR interval of 218 ms. No significant ST changes.  LABS:  BMET    Component Value Date/Time   NA 139 11/23/2013 1048   K 4.8 11/23/2013 1048   CL 104 11/23/2013 1048   CO2 28 11/23/2013 1048   GLUCOSE 77 11/23/2013 1048   BUN 13 11/23/2013 1048   CREATININE 0.92 11/23/2013 1048   CREATININE 1.27 05/12/2009 0527   CALCIUM 9.5 11/23/2013 1048   GFRNONAA 57* 05/12/2009 0527   GFRAA  Value: >60        The eGFR has been calculated using the MDRD equation. This calculation has not been validated in all clinical situations. eGFR's persistently <60 mL/min signify possible Chronic Kidney Disease. 05/12/2009 0527     Hepatic Function Panel     Component Value Date/Time   PROT 6.9 11/23/2013 1048   ALBUMIN 4.3 11/23/2013 1048   AST 19 11/23/2013 1048   ALT 21 11/23/2013 1048   ALKPHOS 43 11/23/2013 1048   BILITOT 0.5 11/23/2013 1048     CBC    Component Value Date/Time   WBC 6.4 11/23/2013 1048   RBC 4.78 11/23/2013 1048   HGB 14.4 11/23/2013 1048   HCT 43.1 11/23/2013 1048   PLT 271 11/23/2013 1048   MCV 90.2 11/23/2013 1048   MCH 30.1 11/23/2013 1048   MCHC 33.4 11/23/2013 1048   RDW 13.8 11/23/2013 1048   LYMPHSABS 1.3 01/31/2009 1506   MONOABS 0.7 01/31/2009 1506   EOSABS  0.0 01/31/2009 1506   BASOSABS 0.0 01/31/2009 1506     BNP No results found for this basename: probnp    Lipid Panel     Component Value Date/Time   CHOL 189 11/23/2013 1048   TRIG 224* 11/23/2013 1048   HDL 39* 11/23/2013 1048   CHOLHDL 4.8 11/23/2013 1048   VLDL 45* 11/23/2013 1048   LDLCALC 105* 11/23/2013 1048     RADIOLOGY: No results found.   ASSESSMENT AND PLAN: My impression is that Mr. Remijio Holleran a 68 year old gentleman who has a remote history of SBE and mitral valve endocarditis for which he required mitral valve repair surgery with quadrangular resection  of his P2 segment of the mitral valve at which time a 32 mm Edwards annuloplasty ring was inserted in 2010.Marland Kitchen His recent echo Doppler study last year showed normal systolic function. There was a suggestion of borderline diastolic abnormality. His valve remained competent. Presently, his blood pressure is controlled on his current medical regimen at 120/80. His ECG is stable with only borderline first-degree AV block and normal rhythm. He's not having any chest pain. He had normal coronary arteries in 2010. There no signs of heart failure. He does have obstructive sleep apnea and has been utilizing his CPAP with 100% compliance. 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 I have recommended that laboratory be checked this was done after his his office visit since he was fasting. Results are as noted above. Chemistry profile and CBC are all normal. His total cholesterol is 189 and he does have mild triglyceride elevation at 224 for the low HDL at 39. LDL cholesterol is 105. He is on simvastatin currently at 40 mg. It will be important for him to reduce carbohydrate intake as well as sweets. He may benefit from the addition of fish oil to his medical regimen. In 6 months, he will have a repeat echo Doppler study and I will see him for  followup office evaluation.   Troy Sine, MD, [facility] 12/18/2013 10:46 AM

## 2013-12-20 ENCOUNTER — Encounter: Payer: Self-pay | Admitting: *Deleted

## 2013-12-20 NOTE — Telephone Encounter (Signed)
Per Burna MortimerWanda, CMA letter ready for pick up.  Call to pt and informed.  Verbalized understanding and will pick up from front desk.

## 2014-04-26 ENCOUNTER — Emergency Department (HOSPITAL_COMMUNITY): Payer: Medicare Other

## 2014-04-26 ENCOUNTER — Emergency Department (HOSPITAL_COMMUNITY)
Admission: EM | Admit: 2014-04-26 | Discharge: 2014-04-26 | Disposition: A | Discharge status: Home / Self Care | Payer: Medicare Other | Attending: Emergency Medicine | Admitting: Emergency Medicine

## 2014-04-26 ENCOUNTER — Other Ambulatory Visit: Payer: Self-pay | Admitting: Cardiovascular Disease

## 2014-04-26 ENCOUNTER — Encounter (HOSPITAL_COMMUNITY): Payer: Self-pay | Admitting: Emergency Medicine

## 2014-04-26 DIAGNOSIS — Z79899 Other long term (current) drug therapy: Secondary | ICD-10-CM | POA: Insufficient documentation

## 2014-04-26 DIAGNOSIS — H543 Unqualified visual loss, both eyes: Secondary | ICD-10-CM | POA: Insufficient documentation

## 2014-04-26 DIAGNOSIS — I10 Essential (primary) hypertension: Secondary | ICD-10-CM | POA: Insufficient documentation

## 2014-04-26 DIAGNOSIS — Z7982 Long term (current) use of aspirin: Secondary | ICD-10-CM | POA: Insufficient documentation

## 2014-04-26 DIAGNOSIS — Z8673 Personal history of transient ischemic attack (TIA), and cerebral infarction without residual deficits: Secondary | ICD-10-CM | POA: Insufficient documentation

## 2014-04-26 DIAGNOSIS — K219 Gastro-esophageal reflux disease without esophagitis: Secondary | ICD-10-CM | POA: Insufficient documentation

## 2014-04-26 DIAGNOSIS — H547 Unspecified visual loss: Secondary | ICD-10-CM

## 2014-04-26 DIAGNOSIS — R011 Cardiac murmur, unspecified: Secondary | ICD-10-CM | POA: Insufficient documentation

## 2014-04-26 DIAGNOSIS — Z87891 Personal history of nicotine dependence: Secondary | ICD-10-CM | POA: Insufficient documentation

## 2014-04-26 LAB — COMPREHENSIVE METABOLIC PANEL
ALT: 27 U/L (ref 0–53)
AST: 24 U/L (ref 0–37)
Albumin: 4.4 g/dL (ref 3.5–5.2)
Alkaline Phosphatase: 53 U/L (ref 39–117)
BILIRUBIN TOTAL: 0.4 mg/dL (ref 0.3–1.2)
BUN: 15 mg/dL (ref 6–23)
CO2: 21 mEq/L (ref 19–32)
Calcium: 9.6 mg/dL (ref 8.4–10.5)
Chloride: 101 mEq/L (ref 96–112)
Creatinine, Ser: 1.01 mg/dL (ref 0.50–1.35)
GFR calc Af Amer: 86 mL/min — ABNORMAL LOW (ref >90)
GFR calc non Af Amer: 74 mL/min — ABNORMAL LOW (ref >90)
GLUCOSE: 100 mg/dL — AB (ref 70–99)
POTASSIUM: 4.2 meq/L (ref 3.7–5.3)
SODIUM: 140 meq/L (ref 137–147)
TOTAL PROTEIN: 7.6 g/dL (ref 6.0–8.3)

## 2014-04-26 LAB — DIFFERENTIAL
Basophils Absolute: 0 10*3/uL (ref 0.0–0.1)
Basophils Relative: 0 % (ref 0–1)
Eosinophils Absolute: 0.2 10*3/uL (ref 0.0–0.7)
Eosinophils Relative: 3 % (ref 0–5)
LYMPHS ABS: 2.1 10*3/uL (ref 0.7–4.0)
Lymphocytes Relative: 31 % (ref 12–46)
Monocytes Absolute: 0.7 10*3/uL (ref 0.1–1.0)
Monocytes Relative: 10 % (ref 3–12)
NEUTROS PCT: 56 % (ref 43–77)
Neutro Abs: 3.9 10*3/uL (ref 1.7–7.7)

## 2014-04-26 LAB — CBC
HCT: 43.6 % (ref 39.0–52.0)
Hemoglobin: 14.4 g/dL (ref 13.0–17.0)
MCH: 30.3 pg (ref 26.0–34.0)
MCHC: 33 g/dL (ref 30.0–36.0)
MCV: 91.8 fL (ref 78.0–100.0)
Platelets: 265 10*3/uL (ref 150–400)
RBC: 4.75 MIL/uL (ref 4.22–5.81)
RDW: 13.1 % (ref 11.5–15.5)
WBC: 6.8 10*3/uL (ref 4.0–10.5)

## 2014-04-26 LAB — I-STAT TROPONIN, ED: TROPONIN I, POC: 0.01 ng/mL (ref 0.00–0.08)

## 2014-04-26 LAB — APTT: aPTT: 28 seconds (ref 24–37)

## 2014-04-26 LAB — PROTIME-INR
INR: 0.96 (ref 0.00–1.49)
Prothrombin Time: 12.6 seconds (ref 11.6–15.2)

## 2014-04-26 LAB — CBG MONITORING, ED: Glucose-Capillary: 100 mg/dL — ABNORMAL HIGH (ref 70–99)

## 2014-04-26 NOTE — Discharge Instructions (Signed)
Visual Disturbances  You have had a disturbance in your vision. This may be caused by various conditions, such as:   Migraines. Migraine headaches are often preceded by a disturbance in vision. Blind spots or light flashes are followed by a headache. This type of visual disturbance is temporary. It does not damage the eye.   Glaucoma. This is caused by increased pressure in the eye. Symptoms include haziness, blurred vision, or seeing rainbow colored circles when looking at bright lights. Partial or complete visual loss can occur. You may or may not experience eye pain. Visual loss may be gradual or sudden and is irreversible. Glaucoma is the leading cause of blindness.   Retina problems. Vision will be reduced if the retina becomes detached or if there is a circulation problem as with diabetes, high blood pressure, or a mini-stroke. Symptoms include seeing "floaters," flashes of light, or shadows, as if a curtain has fallen over your eye.   Optic nerve problems. The main nerve in your eye can be damaged by redness, soreness, and swelling (inflammation), poor circulation, drugs, and toxins.  It is very important to have a complete exam done by a specialist to determine the exact cause of your eye problem. The specialist may recommend medicines or surgery, depending on the cause of the problem. This can help prevent further loss of vision or reduce the risk of having a stroke. Contact the caregiver to whom you have been referred and arrange for follow-up care right away.  SEEK IMMEDIATE MEDICAL CARE IF:    Your vision gets worse.   You develop severe headaches.   You have any weakness or numbness in the face, arms, or legs.   You have any trouble speaking or walking.  Document Released: 12/05/2004 Document Revised: 01/20/2012 Document Reviewed: 03/28/2010  [employer] Patient Information 2014 [employer].

## 2014-04-26 NOTE — ED Notes (Signed)
MD at bedside. 

## 2014-04-26 NOTE — ED Notes (Signed)
Per Patient: Pt states he had sudden loss of vision in both eyes starting at 1800 today. Pt has history of previous stroke in 2010, no deficits with previous stroke. Pt currently no other neurological deficits. Ax4, NAD.

## 2014-04-26 NOTE — ED Provider Notes (Signed)
CSN: [id number]     Arrival date & time 04/26/14  1911 History   First MD Initiated Contact with Patient 04/26/14 1954     Chief Complaint  Patient presents with  . Loss of Vision     (Consider location/radiation/quality/duration/timing/severity/associated sxs/prior Treatment) The history is provided by the patient.   Edgar Jones is a 68 y.o. male who is here for evaluation of vision, loss. He was washing his car when he suddenly developed decreased vision. He walked inside his house, and tried to use his phone to call his wife. He had trouble seeing the following screen, was able to see some bright green lights, on it. After about 1 minute, his vision returned to normal. He was unable to appreciate a difference from eye to eye. It seemed that the vision loss was in both eyes. He has not had recurrence of the vision loss. He denies headache, gait instability, nausea, vomiting, weakness, or dizziness. At this time, he is able to read normally, and does not feel he has any vision loss.  He had some symptoms, several days ago that concerned him. He had transient sensation of his fingers of the right hand, moving without intending to. He also felt that his right elbow straightened without him attempting to do that. Both of these symptoms lasted less than 10 seconds. He denies other illnesses. There's been no fever, chills, nausea, vomiting, chest pain, shortness of breath. There are no other modifying factors.    Past Medical History  Diagnosis Date  . GERD (gastroesophageal reflux disease)   . Heart murmur   . Endocarditis   . Stroke    Past Surgical History  Procedure Laterality Date  . Mitral valve repair    . Inguinal hernia repair      left side  . Hernia repair      right abd  . Upper gastrointestinal endoscopy    . Polypectomy    . Colonoscopy     Family History  Problem Relation Age of Onset  . Heart disease Mother   . Heart disease Father    History  Substance Use  Topics  . Smoking status: Former Smoker    Quit date: 04/29/1981  . Smokeless tobacco: Never Used  . Alcohol Use: 7.0 oz/week    14 drink(s) per week     Comment: 2 glasses wine every day    Review of Systems    Allergies  Review of patient's allergies indicates no known allergies.  Home Medications   Prior to Admission medications   Medication Sig Start Date End Date Taking? Authorizing Provider  aspirin EC 81 MG tablet Take 81 mg by mouth daily.   Yes Historical Provider, MD  benazepril (LOTENSIN) 10 MG tablet Take 10 mg by mouth daily.     Yes Historical Provider, MD  omeprazole (PRILOSEC) 40 MG capsule Take 1 capsule (40 mg total) by mouth daily. 10/18/13  Yes Runell GessJonathan J Berry, MD  simvastatin (ZOCOR) 40 MG tablet TAKE 1 TABLET BY MOUTH AT BEDTIME    Lennette Biharihomas A Kelly, MD   BP 111/68  Pulse 59  Temp(Src) 97.6 F (36.4 C)  Resp 12  SpO2 97% Physical Exam  Nursing note and vitals reviewed. Constitutional: He is oriented to person, place, and time. He appears well-developed and well-nourished.  HENT:  Head: Normocephalic and atraumatic.  Right Ear: External ear normal.  Left Ear: External ear normal.  Eyes: Conjunctivae and EOM are normal. Pupils are equal, round, and  reactive to light.  Neck: Normal range of motion and phonation normal. Neck supple.  Cardiovascular: Normal rate, regular rhythm, normal heart sounds and intact distal pulses.   Pulmonary/Chest: Effort normal and breath sounds normal. He exhibits no bony tenderness.  Abdominal: Soft. There is no tenderness.  Musculoskeletal: Normal range of motion.  Neurological: He is alert and oriented to person, place, and time. No cranial nerve deficit or sensory deficit. He exhibits normal muscle tone. Coordination normal.  No dysarthria, no aphasia. No nystagmus. Normal finger-to-nose, and heel-to-shin, bilaterally.  Skin: Skin is warm, dry and intact.  Psychiatric: He has a normal mood and affect. His behavior is  normal. Judgment and thought content normal.    ED Course  Procedures (including critical care time)   Medications - No data to display  Patient Vitals for the past 24 hrs:  BP Temp Pulse Resp SpO2  04/26/14 2330 - - 59 12 97 %  04/26/14 2300 111/68 mmHg - 59 12 95 %  04/26/14 2230 114/75 mmHg - 61 21 98 %  04/26/14 2023 - 97.6 F (36.4 C) - - -  04/26/14 2000 112/64 mmHg - 58 20 95 %  04/26/14 1924 137/93 mmHg 97.9 F (36.6 C) 62 17 99 %    11:35 PM Reevaluation with update and discussion. After initial assessment and treatment, an updated evaluation reveals No recurrence of vision abnormality. Findings discussed with Pt and wife, all questions answered.Flint Melter. Shaasia Odle L   20:30- Discussed with Neuro hospitalist; rec. MR Brain, d/c if neg.  23:30- I discussed the MR result with DR. Lampke (Radiologist) who reiterated that the presence of microhemorrhages is not an acute CVA process. 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  Labs Review Labs Reviewed  COMPREHENSIVE METABOLIC PANEL - Abnormal; Notable for the following:    Glucose, Bld 100 (*)    GFR calc non Af Amer 74 (*)    GFR calc Af Amer 86 (*)    All other components within normal limits  CBG MONITORING, ED - Abnormal; Notable for the following:    Glucose-Capillary 100 (*)    All other components within normal limits  PROTIME-INR  APTT  CBC  DIFFERENTIAL  Rosezena SensorI-STAT TROPOININ, ED    Imaging Review Mr Brain Wo Contrast  04/26/2014   CLINICAL DATA:  68 year old male with sudden onset dizziness and visual disturbance. Initial encounter. History of cerebral hemorrhage.  EXAM: MRI HEAD WITHOUT CONTRAST  TECHNIQUE: Multiplanar, multiecho pulse sequences of the brain and surrounding structures were obtained without intravenous contrast.  COMPARISON:  Head CT 03/17/2009.  Brain MRI 02/01/2009.  FINDINGS: Major intracranial vascular flow voids are stable. Stable cerebral  volume. No restricted diffusion to suggest acute infarction. No midline shift, mass effect, evidence of mass lesion, ventriculomegaly, extra-axial collection or acute intracranial hemorrhage. Cervicomedullary junction and pituitary are within normal limits. Grossly negative visualized cervical spine. Normal bone marrow signal.  Left occipital pole encephalomalacia related to the 2010 intra-axial hemorrhage. Residual hemosiderin. Essentially resolved signal abnormality in the right parietal lobe at the site of the small enhancing lesion in 2010 (faint residual FLAIR hyperintensity series 6, image 23).  Scattered chronic micro hemorrhages in the brain have increased in number since 2010 (series 7).  Elsewhere Stable mild nonspecific cerebral white matter T2 and FLAIR hyperintensity. Deep gray matter nuclei, brainstem and cerebellum remain within normal limits. Visible internal auditory structures appear normal. Mastoids are clear.  Grossly stable orbits soft tissues, mild motion artifact  today. Visualized scalp soft tissues are within normal limits. Decreased paranasal sinus mucosal thickening.  IMPRESSION: 1.  No acute intracranial abnormality. 2. Expected evolution of the left occipital lobe hemorrhage and small right parietal cortex infarct detected in 2010. Interval mildly increased microhemorrhages in both cerebral hemispheres.   Electronically Signed   By: Augusto Gamble M.D.   On: 04/26/2014 22:09     EKG Interpretation None      MDM   Final diagnoses:  Vision loss  Hypertension    Transient bilateral vision abnormality without signs for CVA. MR negative for acute CVA. 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   Nursing Notes Reviewed/ Care Coordinated Applicable Imaging Reviewed Interpretation of Laboratory Data incorporated into ED treatment  The patient appears reasonably screened and/or stabilized for discharge and I doubt any other medical condition  or other [facility] requiring further screening, evaluation, or treatment in the ED at this time prior to discharge.  Plan: Home Medications- usual; Home Treatments- rest; return here if the recommended treatment, does not improve the symptoms; Recommended follow up- Neurology 1 week.    Flint Melter, MD 04/27/14 1124

## 2014-04-26 NOTE — ED Notes (Signed)
Patient transported to MRI 

## 2014-04-26 NOTE — ED Notes (Signed)
Dr. Effie ShyWentz informed of patient symptoms, instructed not to call code stroke at this time. Put to perform a visual acuity exam.

## 2014-04-27 ENCOUNTER — Telehealth: Payer: Self-pay | Admitting: Neurology

## 2014-04-27 NOTE — Telephone Encounter (Signed)
Pt last seen in [facility] with Dr. Pearlean BrownieSethi 2010.    Needs new referral (was seen in ED).  [facility]

## 2014-04-27 NOTE — Telephone Encounter (Signed)
Rx was sent to pharmacy electronically. 

## 2014-04-27 NOTE — Telephone Encounter (Signed)
Patient calling to state that he had an episode yesterday where he lost vision for a brief time, he went to the ER and they did an MRI and advised him to schedule an appointment with Dr. Pearlean BrownieSethi. Please return call to patient and advise.

## 2014-05-05 ENCOUNTER — Ambulatory Visit (HOSPITAL_COMMUNITY)
Admission: RE | Admit: 2014-05-05 | Discharge: 2014-05-05 | Disposition: A | Discharge status: Home / Self Care | Payer: Medicare Other | Source: Ambulatory Visit | Attending: Cardiology | Admitting: Cardiology

## 2014-05-05 DIAGNOSIS — I059 Rheumatic mitral valve disease, unspecified: Secondary | ICD-10-CM | POA: Insufficient documentation

## 2014-05-05 DIAGNOSIS — I369 Nonrheumatic tricuspid valve disorder, unspecified: Secondary | ICD-10-CM

## 2014-05-05 DIAGNOSIS — I341 Nonrheumatic mitral (valve) prolapse: Secondary | ICD-10-CM

## 2014-05-05 NOTE — Progress Notes (Signed)
2D Echo Performed 05/05/2014    Tammie Crouch, RCS  

## 2014-05-09 ENCOUNTER — Telehealth (HOSPITAL_COMMUNITY): Payer: Self-pay | Admitting: *Deleted

## 2014-05-10 ENCOUNTER — Ambulatory Visit (INDEPENDENT_AMBULATORY_CARE_PROVIDER_SITE_OTHER): Payer: Medicare Other | Admitting: Neurology

## 2014-05-10 ENCOUNTER — Encounter: Payer: Self-pay | Admitting: Neurology

## 2014-05-10 VITALS — BP 115/78 | HR 63 | Ht 73.0 in | Wt 211.0 lb

## 2014-05-10 DIAGNOSIS — G458 Other transient cerebral ischemic attacks and related syndromes: Secondary | ICD-10-CM

## 2014-05-10 DIAGNOSIS — I679 Cerebrovascular disease, unspecified: Secondary | ICD-10-CM | POA: Insufficient documentation

## 2014-05-10 NOTE — Progress Notes (Signed)
Reason for visit: TIA  Edgar Jones is a 68 y.o. 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 He has done relatively well, taking aspirin therapy since that time. The patient has had a recent event associated with transient weakness of the right arm that lasted only a few seconds. He denied any other symptoms with this event. On 04/26/2014, he had another event associated with transient visual disturbance with loss of the left homonymous field. The patient had some "tunnel vision" as well. He did not feel dizzy or fainty around the time of this event. He reported no focal numbness or weakness of the face, arms, or legs. Again the episode lasted about 2 minutes with full clearing. He denied any headache. He has had some slight dizziness since that time. He did go to the emergency room, and MRI evaluation did not show evidence of an acute stroke. He has had a 2-D echocardiogram that was relatively unremarkable with exception that the left atrium was significantly dilated. He remains on aspirin. He is sent to this office for further evaluation. He denies any palpitations of the heart.  Past Medical History  Diagnosis Date  . GERD (gastroesophageal reflux disease)   . Heart murmur   . Endocarditis   . Hypertension   . Dyslipidemia   . Stroke     3/10 endocarditis/embolic    Past Surgical History  Procedure Laterality Date  . Mitral valve repair    . Inguinal hernia repair      left side  . Hernia repair      right abd  . Upper gastrointestinal endoscopy    . Polypectomy    . Colonoscopy      Family History  Problem Relation Age of Onset  . Heart disease Mother   . Heart disease Father   . Arthritis Father   . Cirrhosis Brother   . Hypertension Maternal Aunt   . Hypertension Brother   .  Hyperlipidemia Brother     Social history:  reports that he quit smoking about 33 years ago. His smoking use included Cigarettes. He has a 40 pack-year smoking history. He has never used smokeless tobacco. He reports that he drinks about 7 ounces of alcohol per week. He reports that he does not use illicit drugs.  Medications:  Current Outpatient Prescriptions on File Prior to Visit  Medication Sig Dispense Refill  . aspirin EC 81 MG tablet Take 81 mg by mouth daily.      . benazepril (LOTENSIN) 10 MG tablet Take 10 mg by mouth daily.        Marland Kitchen. omeprazole (PRILOSEC) 40 MG capsule Take 1 capsule (40 mg total) by mouth daily.  30 capsule  3  . simvastatin (ZOCOR) 40 MG tablet TAKE 1 TABLET BY MOUTH AT BEDTIME  30 tablet  7   No current facility-administered medications on file prior to visit.     No Known Allergies  ROS:  Out of a complete 14 system review of symptoms, the patient complains only of the following symptoms, and all other reviewed systems are negative.  Swelling of the legs Difficulty swallowing Shortness of breath, snoring Impotence Dizziness   Blood pressure 115/78, pulse 63, height 6\' 1"  (1.854 m), weight 211 lb (95.709 kg).  Physical Exam  General: The patient  is alert and cooperative at the time of the examination.  Eyes: Pupils are equal, round, and reactive to light. Discs are flat bilaterally.  Neck: The neck is supple, no carotid bruits are noted.  Respiratory: The respiratory examination is clear.  Cardiovascular: The cardiovascular examination reveals a regular rate and rhythm, no obvious murmurs or rubs are noted.  Skin: Extremities are without significant edema.  Neurologic Exam  Mental status: The patient is alert and oriented x 3 at the time of the examination. The patient has apparent normal recent and remote memory, with an apparently normal attention span and concentration ability.  Cranial nerves: Facial symmetry is present. There is good  sensation of the face to pinprick and soft touch bilaterally. The strength of the facial muscles and the muscles to head turning and shoulder shrug are normal bilaterally. Speech is well enunciated, no aphasia or dysarthria is noted. Extraocular movements are full. Visual fields are full. The tongue is midline, and the patient has symmetric elevation of the soft palate. No obvious hearing deficits are noted.  Motor: The motor testing reveals 5 over 5 strength of all 4 extremities. Good symmetric motor tone is noted throughout.  Sensory: Sensory testing is intact to pinprick, soft touch, vibration sensation, and position sense on all 4 extremities. No evidence of extinction is noted.  Coordination: Cerebellar testing reveals good finger-nose-finger and heel-to-shin bilaterally.  Gait and station: Gait is normal. Tandem gait is normal. Romberg is negative. No drift is seen.  Reflexes: Deep tendon reflexes are symmetric and normal bilaterally. Toes are downgoing bilaterally.   MRI brain 04/26/2014:  IMPRESSION:  1. No acute intracranial abnormality.  2. Expected evolution of the left occipital lobe hemorrhage and  small right parietal cortex infarct detected in 2010. Interval  mildly increased microhemorrhages in both cerebral hemispheres.    2D echo 05/05/14:  Study Conclusions  - Left ventricle: Systolic function was normal. The estimated ejection fraction was in the range of 60% to 65%. Features are consistent with a pseudonormal left ventricular filling pattern, with concomitant abnormal relaxation and increased filling pressure (grade 2 diastolic dysfunction). - Aortic valve: There was trivial regurgitation. - Mitral valve: s/p MV repair. Calcified annulus. Moderately thickened, moderately calcified leaflets . - Left atrium: The atrium was severely dilated. - Right ventricle: The cavity size was mildly dilated. Systolic function was mildly reduced.    Assessment/Plan:   1.  History of mitral valve repair  2. Possible TIA, right arm weakness, left visual field change  The patient had 2 recent events occurring at different times, referable to the right and left brain. The patient will need to be evaluated for possible cardioembolic events. The patient has already undergone a 2D echocardiogram. He will be set up for a prolonged cardiac monitor. He will remain on aspirin at this time, and a carotid Doppler study will be done. He will followup if needed. In the future, a loop recorder may be indicated if the events continue.  Marlan Palau. Keith Deshawna Mcneece MD 05/10/2014 9:18 PM  [facility] [address] [place], [place] [id number]  Phone [phone] Fax [phone]

## 2014-05-10 NOTE — Patient Instructions (Signed)
Transient Ischemic Attack  A transient ischemic attack (TIA) is a "warning stroke" that causes stroke-like symptoms. Unlike a stroke, a TIA does not cause permanent damage to the brain. The symptoms of a TIA can happen very fast and do not last long. It is important to know the symptoms of a TIA and what to do. This can help prevent a major stroke or death.  CAUSES    A TIA is caused by a temporary blockage in an artery in the brain or neck (carotid artery). The blockage does not allow the brain to get the blood supply it needs and can cause different symptoms. The blockage can be caused by either:   A blood clot.   Fatty buildup (plaque) in a neck or brain artery.  RISK FACTORS   High blood pressure (hypertension).   High cholesterol.   Diabetes mellitus.   Heart disease.   The build up of plaque in the blood vessels (peripheral artery disease or atherosclerosis).   The build up of plaque in the blood vessels providing blood and oxygen to the brain (carotid artery stenosis).   An abnormal heart rhythm (atrial fibrillation).   Obesity.   Smoking.   Taking oral contraceptives (especially in combination with smoking).   Physical inactivity.   A diet high in fats, salt (sodium), and calories.   Alcohol use.   Use of illegal drugs (especially cocaine and methamphetamine).   Being male.   Being African American.   Being over the age of 55.   Family history of stroke.   Previous history of blood clots, stroke, TIA, or heart attack.   Sickle cell disease.  SYMPTOMS   TIA symptoms are the same as a stroke but are temporary. These symptoms usually develop suddenly, or may be newly present upon awakening from sleep:   Sudden weakness or numbness of the face, arm, or leg, especially on one side of the body.   Sudden trouble walking or difficulty moving arms or legs.   Sudden confusion.   Sudden personality changes.   Trouble speaking (aphasia) or understanding.   Difficulty swallowing.   Sudden  trouble seeing in one or both eyes.   Double vision.   Dizziness.   Loss of balance or coordination.   Sudden severe headache with no known cause.   Trouble reading or writing.   Loss of bowel or bladder control.   Loss of consciousness.  DIAGNOSIS   Your caregiver may be able to determine the presence or absence of a TIA based on your symptoms, history, and physical exam. Computed tomography (CT scan) of the brain is usually performed to help identify a TIA. Other tests may be done to diagnose a TIA. These tests may include:   Electrocardiography.   Continuous heart monitoring.   Echocardiography.   Carotid ultrasonography.   Magnetic resonance imaging (MRI).   A scan of the brain circulation.   Blood tests.  PREVENTION   The risk of a TIA can be decreased by appropriately treating high blood pressure, high cholesterol, diabetes, heart disease, and obesity and by quitting smoking, limiting alcohol, and staying physically active.  TREATMENT   Time is of the essence. Since the symptoms of TIA are the same as a stroke, it is important to seek treatment as soon as possible because you may need a medicine to dissolve the clot (thrombolytic) that cannot be given if too much time has passed. Treatment options vary. Treatment options may include rest, oxygen, intravenous (IV) fluids,   and medicines to thin the blood (anticoagulants). Medicines and diet may be used to address diabetes, high blood pressure, and other risk factors. Measures will be taken to prevent short-term and long-term complications, including infection from breathing foreign material into the lungs (aspiration pneumonia), blood clots in the legs, and falls. Treatment options include procedures to either remove plaque in the carotid arteries or dilate carotid arteries that have narrowed due to plaque. Those procedures are:   Carotid endarterectomy.   Carotid angioplasty and stenting.  HOME CARE INSTRUCTIONS    Take all medicines prescribed  by your caregiver. Follow the directions carefully. Medicines may be used to control risk factors for a stroke. Be sure you understand all your medicine instructions.   You may be told to take aspirin or the anticoagulant warfarin. Warfarin needs to be taken exactly as instructed.   Taking too much or too little warfarin is dangerous. Too much warfarin increases the risk of bleeding. Too little warfarin continues to allow the risk for blood clots. While taking warfarin, you will need to have regular blood tests to measure your blood clotting time. A PT blood test measures how long it takes for blood to clot. Your PT is used to calculate another value called an INR. Your PT and INR help your caregiver to adjust your dose of warfarin. The dose can change for many reasons. It is critically important that you take warfarin exactly as prescribed.   Many foods, especially foods high in vitamin K can interfere with warfarin and affect the PT and INR. Foods high in vitamin K include spinach, kale, broccoli, cabbage, collard and turnip greens, brussels sprouts, peas, cauliflower, seaweed, and parsley as well as beef and pork liver, green tea, and soybean oil. You should eat a consistent amount of foods high in vitamin K. Avoid major changes in your diet, or notify your caregiver before changing your diet. Arrange a visit with a dietitian to answer your questions.   Many medicines can interfere with warfarin and affect the PT and INR. You must tell your caregiver about any and all medicines you take, this includes all vitamins and supplements. Be especially cautious with aspirin and anti-inflammatory medicines. Do not take or discontinue any prescribed or over-the-counter medicine except on the advice of your caregiver or pharmacist.   Warfarin can have side effects, such as excessive bruising or bleeding. You will need to hold pressure over cuts for longer than usual. Your caregiver or pharmacist will discuss other  potential side effects.   Avoid sports or activities that may cause injury or bleeding.   Be mindful when shaving, flossing your teeth, or handling sharp objects.   Alcohol can change the body's ability to handle warfarin. It is best to avoid alcoholic drinks or consume only very small amounts while taking warfarin. Notify your caregiver if you change your alcohol intake.   Notify your dentist or other caregivers before procedures.   Eat a diet that includes 5 or more servings of fruits and vegetables each day. This may reduce the risk of stroke. Certain diets may be prescribed to address high blood pressure, high cholesterol, diabetes, or obesity.   A low-sodium, low-saturated fat, low-trans fat, low-cholesterol diet is recommended to manage high blood pressure.   A low-saturated fat, low-trans fat, low-cholesterol, and high-fiber diet may control cholesterol levels.   A controlled-carbohydrate, controlled-sugar diet is recommended to manage diabetes.   A reduced-calorie, low-sodium, low-saturated fat, low-trans fat, low-cholesterol diet is recommended to manage obesity.     Maintain a healthy weight.   Stay physically active. It is recommended that you get at least 30 minutes of activity on most or all days.   Do not smoke.   Limit alcohol use even if you are not taking warfarin. Moderate alcohol use is considered to be:   No more than 2 drinks each day for men.   No more than 1 drink each day for nonpregnant women.   Stop drug abuse.   Home safety. A safe home environment is important to reduce the risk of falls. Your caregiver may arrange for specialists to evaluate your home. Having grab bars in the bedroom and bathroom is often important. Your caregiver may arrange for equipment to be used at home, such as raised toilets and a seat for the shower.   Follow all instructions for follow-up with your caregiver. This is very important. This includes any referrals and lab tests. Proper follow up can  prevent a stroke or another TIA from occurring.  SEEK MEDICAL CARE IF:   You have personality changes.   You have difficulty swallowing.   You are seeing double.   You have dizziness.   You have a fever.   You have skin breakdown.  SEEK IMMEDIATE MEDICAL CARE IF:   Any of these symptoms may represent a serious problem that is an emergency. Do not wait to see if the symptoms will go away. Get medical help right away. Call your local emergency services (911 in [place] Do not drive yourself to the hospital.   You have sudden weakness or numbness of the face, arm, or leg, especially on one side of the body.   You have sudden trouble walking or difficulty moving arms or legs.   You have sudden confusion.   You have trouble speaking (aphasia) or understanding.   You have sudden trouble seeing in one or both eyes.   You have a loss of balance or coordination.   You have a sudden, severe headache with no known cause.   You have new chest pain or an irregular heartbeat.   You have a partial or total loss of consciousness.  MAKE SURE YOU:    Understand these instructions.   Will watch your condition.   Will get help right away if you are not doing well or get worse.  Document Released: 08/07/2005 Document Revised: 11/02/2013 Document Reviewed: 12/21/2009  [employer] Patient Information 2015 [employer]. This information is not intended to replace advice given to you by your health care provider. Make sure you discuss any questions you have with your health care provider.

## 2014-05-19 ENCOUNTER — Encounter: Payer: Self-pay | Admitting: *Deleted

## 2014-05-19 ENCOUNTER — Encounter (INDEPENDENT_AMBULATORY_CARE_PROVIDER_SITE_OTHER): Payer: Medicare Other

## 2014-05-19 DIAGNOSIS — G458 Other transient cerebral ischemic attacks and related syndromes: Secondary | ICD-10-CM

## 2014-05-19 DIAGNOSIS — G459 Transient cerebral ischemic attack, unspecified: Secondary | ICD-10-CM

## 2014-05-19 DIAGNOSIS — R42 Dizziness and giddiness: Secondary | ICD-10-CM

## 2014-05-19 DIAGNOSIS — I6350 Cerebral infarction due to unspecified occlusion or stenosis of unspecified cerebral artery: Secondary | ICD-10-CM

## 2014-05-19 NOTE — Progress Notes (Signed)
Patient ID: Edgar Jones, male   DOB: Feb 03, 1946, 68 y.o.   MRN: [id number] [employer] 30 day cardiac event monitor applied to patient.

## 2014-05-20 ENCOUNTER — Telehealth: Payer: Self-pay | Admitting: Cardiovascular Disease

## 2014-05-20 NOTE — Telephone Encounter (Signed)
Patient did not listen to the message I left with his Echo results.  Results given - normal EF, view of MV repair stable.  Patient voiced understanding.

## 2014-05-20 NOTE — Telephone Encounter (Signed)
Patient is returning a call from the clinical staff that he received today.

## 2014-05-23 ENCOUNTER — Ambulatory Visit (INDEPENDENT_AMBULATORY_CARE_PROVIDER_SITE_OTHER): Payer: Medicare Other

## 2014-05-23 DIAGNOSIS — G458 Other transient cerebral ischemic attacks and related syndromes: Secondary | ICD-10-CM

## 2014-05-25 ENCOUNTER — Telehealth: Payer: Self-pay | Admitting: Neurology

## 2014-05-25 NOTE — Telephone Encounter (Signed)
I called the patient. The carotid Doppler study was unremarkable. 

## 2014-06-28 ENCOUNTER — Telehealth: Payer: Self-pay | Admitting: Neurology

## 2014-06-28 DIAGNOSIS — G458 Other transient cerebral ischemic attacks and related syndromes: Secondary | ICD-10-CM

## 2014-06-28 NOTE — Telephone Encounter (Signed)
I called patient. I discussed the results of the prolonged cardiac monitor. This showed no atrial fibrillation. I discussed the possibility of a loop recorder, he is amenable to this. He is already followed by Dr. Nicki Guadalajarahomas Kelly.

## 2014-07-19 ENCOUNTER — Encounter: Payer: Self-pay | Admitting: Cardiovascular Disease

## 2014-07-19 ENCOUNTER — Ambulatory Visit (INDEPENDENT_AMBULATORY_CARE_PROVIDER_SITE_OTHER): Payer: Medicare Other | Admitting: Cardiovascular Disease

## 2014-07-19 VITALS — BP 110/80 | HR 51 | Ht 73.0 in | Wt 214.6 lb

## 2014-07-19 DIAGNOSIS — I330 Acute and subacute infective endocarditis: Secondary | ICD-10-CM

## 2014-07-19 DIAGNOSIS — E785 Hyperlipidemia, unspecified: Secondary | ICD-10-CM

## 2014-07-19 DIAGNOSIS — G458 Other transient cerebral ischemic attacks and related syndromes: Secondary | ICD-10-CM

## 2014-07-19 DIAGNOSIS — R001 Bradycardia, unspecified: Secondary | ICD-10-CM | POA: Insufficient documentation

## 2014-07-19 DIAGNOSIS — Z9889 Other specified postprocedural states: Secondary | ICD-10-CM

## 2014-07-19 DIAGNOSIS — I498 Other specified cardiac arrhythmias: Secondary | ICD-10-CM

## 2014-07-19 DIAGNOSIS — I10 Essential (primary) hypertension: Secondary | ICD-10-CM

## 2014-07-19 NOTE — Patient Instructions (Signed)
Your physician wants you to follow-up in:  6 months. You will receive a reminder letter in the mail two months in advance. If you don't receive a letter, please call our office to schedule the follow-up appointment.   

## 2014-07-19 NOTE — Progress Notes (Signed)
Patient ID: Edgar Jones, male   DOB: 09/19/1946, 68 y.o.   MRN: [id number]     HPI: [name] is a 84 for a patient of Dr. Alanda Amass who presents to the office today for an eight-month followup cardiology evaluation after establishing cardiology care with me in January 2015.   HPI: [name] underwent mitral valve repair or in June 2010 with a 32 mm Edwards annuplasty plasty ring and quadrangular resection of P2 segment of the mitral valve which was done by Dr. Dorris Fetch. 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 At that time he had normal coronary arteries. The patient also has a history of obstructive sleep apnea since 2010 and has been on CPAP therapy and admits to 100% use. There is also a history of hypertension as well as hyperlipidemia in addition to GERD.  When I saw him in January, he denied any episodes of chest pain. He noted mild episodes intermittently and feels at times she is unable to get a deep breath. His last echo Doppler study was done in April 2014 which confirmed normal systolic function with an ejection fraction of 55-60%. Left atrium was mildly dilated. His mitral valve repair and anoplasty wearing were stable there was no evidence for prolapse and only trivial regurgitation. He had mild TR.  Since I last saw him he apparently experienced 2 transient events associated with transient right hand twitching and weakness as well as another episode of "total "vision.  He he denied any dizziness or he saw Dr. Lesia Sago.  Carotid studies were done, which did not demonstrate any significant abnormality.  An MRI of his brain did not reveal any acute intracranial abnormality.  There was expected evolution of the left occipital lobe hemorrhage and small right parietal cortex infarct, which previously had been detected in 2010.  There was evidence for mild interval increase microhemorrhages in both cerebral hemispheres.   A repeat echo Doppler study was also done in June 2015, which showed an ejection fraction in the range of 60-65%.  There was probable to diastolic dysfunction.  There is trivial aortic regurgitation.  His mitral valve repair or was intact.  Moderately thickened leaflets.  His atrium was dilated.  His right ventricle was mildly dilated.  It was felt by Dr. Anne Hahn that he possibly he may have had a TIA, but this was not definitive.  The possibility of a future loop recorder was discussed if the patient had recurrent symptomatology.  The patient denies any recurrent symptoms.  He denies any awareness of any palpitations.  He denies chest pain.  He denies fever or shortness of breath.  Past Medical History  Diagnosis Date  . GERD (gastroesophageal reflux disease)   . Heart murmur   . Endocarditis   . Hypertension   . Dyslipidemia   . Stroke     3/10 endocarditis/embolic    Past Surgical History  Procedure Laterality Date  . Mitral valve repair    . Inguinal hernia repair      left side  . Hernia repair      right abd  . Upper gastrointestinal endoscopy    . Polypectomy    . Colonoscopy      No Known Allergies  Current Outpatient Prescriptions  Medication Sig Dispense Refill  . aspirin EC 81 MG tablet Take 81 mg by mouth daily.      . benazepril (LOTENSIN) 10 MG tablet Take 10  mg by mouth daily.        Marland Kitchen omeprazole (PRILOSEC) 40 MG capsule Take 1 capsule (40 mg total) by mouth daily.  30 capsule  3  . simvastatin (ZOCOR) 40 MG tablet TAKE 1 TABLET BY MOUTH AT BEDTIME  30 tablet  7   No current facility-administered medications for this visit.    Socially he is married has 3 children and 2 grandchildren. There is a remote tobacco history but he quit over 30 years ago. 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 He is able to exercise without restriction. He does play golf and rides a bike.  Family History  Problem Relation Age  of Onset  . Heart disease Mother   . Heart disease Father   . Arthritis Father   . Cirrhosis Brother   . Hypertension Maternal Aunt   . Hypertension Brother   . Hyperlipidemia Brother     ROS General: Negative; No fevers, chills, or night sweats;  HEENT: Negative; No changes in vision or hearing, sinus congestion, difficulty swallowing Pulmonary: Negative; No cough, wheezing, shortness of breath, hemoptysis Cardiovascular: Negative; No chest pain, presyncope, syncope, palpitations GI: Negative; No nausea, vomiting, diarrhea, or abdominal pain GU: Negative; No dysuria, hematuria, or difficulty voiding Musculoskeletal: Negative; no myalgias, joint pain, or weakness Hematologic/Oncology: Negative; no easy bruising, bleeding Endocrine: Negative; no heat/cold intolerance; no diabetes Neuro: See history of present illness; no changes in balance, headaches Skin: Negative; No rashes or skin lesions Psychiatric: Negative; No behavioral problems, depression Sleep: Positive for sleep apnea on CPAP therapy; No snoring, daytime sleepiness, hypersomnolence, bruxism, restless legs, hypnogognic hallucinations, no cataplexy Other comprehensive 14 point system review is negative.   PE BP 110/80  Pulse 51  Ht  (1.854 m)  Wt 214 lb 9.6 oz (97.342 kg)  BMI 28.32 kg/m2 General: Alert, oriented, no distress.  Skin: normal turgor, no rashes HEENT: Normocephalic, atraumatic. Pupils round and reactive; sclera anicteric; extraocular muscles intact; Fundi without hemorrhages or exudates. No xanthelasmas Nose without nasal septal hypertrophy Mouth/Parynx benign; Mallinpatti scale Neck: No JVD, no carotid bruits; normal carotid upstroke Lungs: clear to ausculatation and percussion; no wheezing or rales Chest wall: without tenderness to palpitation Heart: RRR, s1 s2 normal 2/6 systolic murmur at the left sternal border radiating to the apex. No S3 or S4 gallop. Abdomen: soft, nontender; no  hepatosplenomehaly, BS+; abdominal aorta nontender and not dilated by palpation. Back: no CVA tenderness Pulses 2+ Extremities: no clubbinbg cyanosis or edema, Homan's sign negative  Neurologic: grossly nonfocal; Cranial nerves grossly wnl Psychologic: Normal mood and affect  ECG (independently read by me): Sinus bradycardia 51 beats per minute.  Borderline first-degree V. block with a PR interval of 208 ms.  QTc interval 411 milliseconds  November 23, 2013 ECG (independently read by me): Sinus bradycardia with very mild first-degree block with a PR interval of 218 ms. No significant ST changes.  LABS:  BMET    Component Value Date/Time   NA 140 04/26/2014 1930   K 4.2 04/26/2014 1930   CL 101 04/26/2014 1930   CO2 21 04/26/2014 1930   GLUCOSE 100* 04/26/2014 1930   BUN 15 04/26/2014 1930   CREATININE 1.01 04/26/2014 1930   CREATININE 0.92 11/23/2013 1048   CALCIUM 9.6 04/26/2014 1930   GFRNONAA 74* 04/26/2014 1930   GFRAA 86* 04/26/2014 1930     Hepatic Function Panel     Component Value Date/Time  PROT 7.6 04/26/2014 1930   ALBUMIN 4.4 04/26/2014 1930   AST 24 04/26/2014 1930   ALT 27 04/26/2014 1930   ALKPHOS 53 04/26/2014 1930   BILITOT 0.4 04/26/2014 1930     CBC    Component Value Date/Time   WBC 6.8 04/26/2014 1930   RBC 4.75 04/26/2014 1930   HGB 14.4 04/26/2014 1930   HCT 43.6 04/26/2014 1930   PLT 265 04/26/2014 1930   MCV 91.8 04/26/2014 1930   MCH 30.3 04/26/2014 1930   MCHC 33.0 04/26/2014 1930   RDW 13.1 04/26/2014 1930   LYMPHSABS 2.1 04/26/2014 1930   MONOABS 0.7 04/26/2014 1930   EOSABS 0.2 04/26/2014 1930   BASOSABS 0.0 04/26/2014 1930     BNP No results found for this basename: probnp    Lipid Panel     Component Value Date/Time   CHOL 189 11/23/2013 1048   TRIG 224* 11/23/2013 1048   HDL 39* 11/23/2013 1048   CHOLHDL 4.8 11/23/2013 1048   VLDL 45* 11/23/2013 1048   LDLCALC 105* 11/23/2013 1048     RADIOLOGY: No results found.   ASSESSMENT AND PLAN:   Mr. Derrion Tritz a 68 year old gentleman who has a remote history of SBE and mitral valve endocarditis for which he required mitral valve repair surgery with quadrangular resection of his P2 segment of the mitral valve at which time a 32 mm Edwards annuloplasty ring was inserted in 2010. His echo Doppler study last year showed normal systolic function. There was a suggestion of borderline diastolic abnormality. His valve remained competent. Presently, his blood pressure is controlled at 110/80 on his current medical regimen consisting of lotensin 10 mg at 120/80. His ECG is stable with only borderline first-degree AV block and demonstrates sinus bradycardia.  Is not on any beta blocker.  He also had a recent monitor, which I reviewed, which showed a rare isolated PAC and PVC.  His most recent echo Doppler study from June 2015 after he developed 2 transient neurologic symptoms continued to show normal systolic function with an ejection fraction of 60-65% with grade 2 diastolic dysfunction.  His valve remained competent without regurgitation.  He denies any awareness of ectopy.  There is no history to suggest atrial fibrillation.  If he continues to experience recurrent neurologic symptoms.  A loop recorder may be worthwhile to provide long-term rhythm monitoring for up to 3 years.  Presently, reviewed his laboratory from June.  He is on simvastatin 40 mg for his type per lipidemia and is tolerating this well with and is stable.  He continues to use CPAP for his obstructive sleep apnea.  I will see him in 6 months for reevaluation or sooner if necessary.  Lennette Bihari, MD, [facility] 07/19/2014 5:44 PM

## 2014-09-28 ENCOUNTER — Encounter: Payer: Self-pay | Admitting: Neurology

## 2014-10-04 ENCOUNTER — Encounter: Payer: Self-pay | Admitting: Neurology

## 2014-10-10 ENCOUNTER — Other Ambulatory Visit: Payer: Self-pay | Admitting: Cardiovascular Disease

## 2014-10-10 NOTE — Telephone Encounter (Signed)
Rx was sent to pharmacy electronically. 

## 2014-11-17 ENCOUNTER — Telehealth: Payer: Self-pay | Admitting: Cardiovascular Disease

## 2014-11-17 NOTE — Telephone Encounter (Signed)
Closed encounter °

## 2014-11-22 DIAGNOSIS — E7800 Pure hypercholesterolemia, unspecified: Secondary | ICD-10-CM | POA: Insufficient documentation

## 2014-11-22 DIAGNOSIS — Z8673 Personal history of transient ischemic attack (TIA), and cerebral infarction without residual deficits: Secondary | ICD-10-CM | POA: Insufficient documentation

## 2014-11-22 DIAGNOSIS — R0602 Shortness of breath: Secondary | ICD-10-CM | POA: Insufficient documentation

## 2014-11-22 DIAGNOSIS — K649 Unspecified hemorrhoids: Secondary | ICD-10-CM | POA: Insufficient documentation

## 2015-01-05 ENCOUNTER — Encounter: Payer: Self-pay | Admitting: *Deleted

## 2015-01-09 ENCOUNTER — Other Ambulatory Visit: Payer: Self-pay | Admitting: Cardiovascular Disease

## 2015-01-10 ENCOUNTER — Encounter: Payer: Self-pay | Admitting: Cardiovascular Disease

## 2015-01-10 ENCOUNTER — Ambulatory Visit (INDEPENDENT_AMBULATORY_CARE_PROVIDER_SITE_OTHER): Payer: Medicare Other | Admitting: Cardiovascular Disease

## 2015-01-10 VITALS — BP 112/72 | HR 63 | Ht 73.0 in | Wt 219.5 lb

## 2015-01-10 DIAGNOSIS — Z9889 Other specified postprocedural states: Secondary | ICD-10-CM

## 2015-01-10 DIAGNOSIS — I10 Essential (primary) hypertension: Secondary | ICD-10-CM

## 2015-01-10 DIAGNOSIS — E785 Hyperlipidemia, unspecified: Secondary | ICD-10-CM

## 2015-01-10 DIAGNOSIS — G4733 Obstructive sleep apnea (adult) (pediatric): Secondary | ICD-10-CM

## 2015-01-10 DIAGNOSIS — Z9989 Dependence on other enabling machines and devices: Secondary | ICD-10-CM | POA: Insufficient documentation

## 2015-01-10 DIAGNOSIS — Z79899 Other long term (current) drug therapy: Secondary | ICD-10-CM

## 2015-01-10 NOTE — Patient Instructions (Signed)
Your physician recommends that you return for lab work fasting.  Your physician wants you to follow-up in: 6 months or sooner if needed with Dr. Kelly. You will receive a reminder letter in the mail two months in advance. If you don't receive a letter, please call our office to schedule the follow-up appointment. 

## 2015-01-10 NOTE — Progress Notes (Signed)
Patient ID: AYDYN TESTERMAN, male   DOB: 1946/05/12, 69 y.o.   MRN: [id number]   Primary M.D.: Dr. Juanita Craver  HPI: Edgar Jones is a 66 former a patient of Dr. Rollene Fare who  establishedcardiology care with me in January 2015.  I last saw him in September 2015.  He presents for a six-month cardiology follow-up evaluation.  Edgar Jones underwent mitral valve repair or in June 2010 with a 32 mm Edwards annuplasty plasty ring and quadrangular resection of P2 segment of the mitral valve which was done by Dr. Roxan Hockey. 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 At that time he had normal coronary arteries. He has a history of obstructive sleep apnea since 2010 and has been on CPAP therapy and admits to 100% use. There is also a history of hypertension as well as hyperlipidemia in addition to GERD.  When I saw initially saw him in January 2015, he denied any episodes of chest pain. He noted mild episodes intermittently and feels at times she is unable to get a deep breath. His last echo Doppler study  in April 2014 which confirmed normal systolic function with an ejection fraction of 55-60%. Left atrium was mildly dilated. His mitral valve repair and anoplasty wearing were stable there was no evidence for prolapse and only trivial regurgitation. He had mild TR.  Prior to seeing him in September 2015 he had experienced 2 transient events associated with transient right hand twitching and weakness as well as another episode of "total "vision.  He he denied any dizziness or he saw Dr. Floyde Parkins.  Carotid studies did not demonstrate any significant abnormality.  An MRI of his brain did not reveal any acute intracranial abnormality.  There was expected evolution of the left occipital lobe hemorrhage and small right parietal cortex infarct, which previously had been detected in 2010.  There was evidence for mild interval increase microhemorrhages  in both cerebral hemispheres.  A repeat echo Doppler study was also done in June 2015, which showed an ejection fraction in the range of 60-65%.  There was probable to diastolic dysfunction.  There is trivial aortic regurgitation.  His mitral valve repair was intact.  Moderately thickened leaflets.  His atrium was dilated.  His right ventricle was mildly dilated.  It was felt by Dr. Jannifer Franklin that he possibly he may have had a TIA, but this was not definitive.  The possibility of a future loop recorder was discussed if the patient had recurrent symptomatology.  The patient denies any recurrent symptoms.  He denies any awareness of any palpitations.  He denies chest pain.  He denies fever or shortness of breath.  Over the past 6 months, he states his remained fairly stable.  However, over the past month.  He tells me that his blood pressure was getting elevated and Dr. Tollie Pizza titrated his but has a well from 10 mg to 20 mg.  Nice chest pain.  He denies palpitations.  He denies significant edema.  He also tells me that over the past month.  He received a new CPAP machine.  This is now much quieter and he is been more compliant with his CPAP use.  He denies any awareness of breakthrough snoring.  He denies residual daytime sleepiness.  His sleep is restorative.  Past Medical History  Diagnosis Date  . GERD (gastroesophageal reflux disease)   . Heart murmur   . Endocarditis   .  Hypertension   . Dyslipidemia   . Stroke     3/10 endocarditis/embolic  . SBE (subacute bacterial endocarditis)   . OSA on CPAP   . Hx of echocardiogram 02/10/2013    The cavity size was normal. Ststolic function was normal. The estimated EF was in the range 55%-60%. Wall motion was normal; there was no regional wall motion abnormalities. Stable MV repair & annuloplasty ring. Trace Edgar  . History of stress test 11/22/2010    Perfusion defect in the inferior myocardial region is consistent with diaphragmatic attenuation. The  remaining myocardium demonstrates normal myocardial perfusion with no evidence of ischemia or infarct. The post stress left ventricle is normal in size. The post stress EF 65%, Gobal left ventricle systolic function is normal. No significant wall motion abnormalities noted. Normal Myocardial perfus    Past Surgical History  Procedure Laterality Date  . Mitral valve repair  04/2009    32m Edwards annuloplasty ring and quadrangular resection pf P2 segment of the motral valve by Dr HRoxan Hockeywith normal coronary arteries.  . Inguinal hernia repair      left side  . Hernia repair      right abd  . Upper gastrointestinal endoscopy    . Polypectomy    . Colonoscopy    . Cardiac catheterization  01/2009    No Known Allergies  Current Outpatient Prescriptions  Medication Sig Dispense Refill  . aspirin EC 81 MG tablet Take 81 mg by mouth daily.    . benazepril (LOTENSIN) 20 MG tablet Take 1 tablet by mouth daily.  6  . omeprazole (PRILOSEC) 40 MG capsule TAKE ONE CAPSULE BY MOUTH EVERY DAY 30 capsule 9  . simvastatin (ZOCOR) 40 MG tablet TAKE 1 TABLET BY MOUTH EVERY NIGHT AT BEDTIME 90 tablet 1   No current facility-administered medications for this visit.    Socially he is married has 3 children and 2 grandchildren. There is a remote tobacco history but he quit over 30 years ago. 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 He does play golf and rides a bike.  Family History  Problem Relation Age of Onset  . Heart disease Mother   . Heart disease Father   . Arthritis Father   . Cirrhosis Brother   . Hypertension Maternal Aunt   . Hypertension Brother   . Hyperlipidemia Brother     ROS General: Negative; No fevers, chills, or night sweats;  HEENT: Negative; No changes in vision or hearing, sinus congestion, difficulty swallowing Pulmonary: Negative; No cough, wheezing, shortness of  breath, hemoptysis Cardiovascular: Negative; No chest pain, presyncope, syncope, palpitations GI: Negative; No nausea, vomiting, diarrhea, or abdominal pain GU: Negative; No dysuria, hematuria, or difficulty voiding Musculoskeletal: Negative; no myalgias, joint pain, or weakness Hematologic/Oncology: Negative; no easy bruising, bleeding Endocrine: Negative; no heat/cold intolerance; no diabetes Neuro: See history of present illness; no changes in balance, headaches Skin: Negative; No rashes or skin lesions Psychiatric: Negative; No behavioral problems, depression Sleep: Positive for sleep apnea on CPAP therapy; No snoring, daytime sleepiness, hypersomnolence, bruxism, restless legs, hypnogognic hallucinations, no cataplexy Other comprehensive 14 point system review is negative.   PE BP 112/72 mmHg  Pulse 63  Ht '6\' 1"'  (1.854 m)  Wt 219 lb 8 oz (99.565 kg)  BMI 28.97 kg/m2 General: Alert, oriented, no distress.  Skin: normal turgor, no rashes HEENT: Normocephalic, atraumatic. Pupils round  and reactive; sclera anicteric; extraocular muscles intact; Fundi without hemorrhages or exudates. No xanthelasmas Nose without nasal septal hypertrophy Mouth/Parynx benign; Mallinpatti scale 3 Neck: No JVD, no carotid bruits; normal carotid upstroke Lungs: clear to ausculatation and percussion; no wheezing or rales Chest wall: without tenderness to palpitation Heart: RRR, s1 s2 normal 2/6 systolic murmur at the left sternal border radiating to the apex. No S3 or S4 gallop. Abdomen: soft, nontender; no hepatosplenomehaly, BS+; abdominal aorta nontender and not dilated by palpation. Back: no CVA tenderness Pulses 2+ Extremities: no clubbinbg cyanosis or edema, Homan's sign negative  Neurologic: grossly nonfocal; Cranial nerves grossly wnl Psychologic: Normal mood and affect  ECG (independently read by me): Normal sinus rhythm at 63 bpm.  Occasional PVC.  First-degree AV block with a PR interval at  212.   September 2015 ECG (independently read by me): Sinus bradycardia 51 beats per minute.  Borderline first-degree V. block with a PR interval of 208 ms.  QTc interval 411 milliseconds  November 23, 2013 ECG (independently read by me): Sinus bradycardia with very mild first-degree block with a PR interval of 218 ms. No significant ST changes.  LABS:  BMET  BMP Latest Ref Rng 04/26/2014 11/23/2013 05/12/2009  Glucose 70 - 99 mg/dL 100(H) 77 119(H)  BUN 6 - 23 mg/dL '15 13 20  ' Creatinine 0.50 - 1.35 mg/dL 1.01 0.92 1.27  Sodium 137 - 147 mEq/L 140 139 133(L)  Potassium 3.7 - 5.3 mEq/L 4.2 4.8 3.9  Chloride 96 - 112 mEq/L 101 104 101  CO2 19 - 32 mEq/L '21 28 26  ' Calcium 8.4 - 10.5 mg/dL 9.6 9.5 8.2(L)     Hepatic Function Panel    Hepatic Function Latest Ref Rng 04/26/2014 11/23/2013 05/08/2009  Total Protein 6.0 - 8.3 g/dL 7.6 6.9 7.0  Albumin 3.5 - 5.2 g/dL 4.4 4.3 4.1  AST 0 - 37 U/L '24 19 22  ' ALT 0 - 53 U/L '27 21 18  ' Alk Phosphatase 39 - 117 U/L 53 43 45  Total Bilirubin 0.3 - 1.2 mg/dL 0.4 0.5 0.8    CBC  CBC Latest Ref Rng 04/26/2014 11/23/2013 05/12/2009  WBC 4.0 - 10.5 K/uL 6.8 6.4 10.5  Hemoglobin 13.0 - 17.0 g/dL 14.4 14.4 9.7(L)  Hematocrit 39.0 - 52.0 % 43.6 43.1 28.2(L)  Platelets 150 - 400 K/uL 265 271 123(L)    BNP No results found for: PROBNP  Lipid Panel     Component Value Date/Time   CHOL 189 11/23/2013 1048   TRIG 224* 11/23/2013 1048   HDL 39* 11/23/2013 1048   CHOLHDL 4.8 11/23/2013 1048   VLDL 45* 11/23/2013 1048   LDLCALC 105* 11/23/2013 1048     RADIOLOGY: No results found.   ASSESSMENT AND PLAN: Edgar Jones a 69 year old gentleman who has a remote history of SBE and mitral valve endocarditis for which he required mitral valve repair surgery with quadrangular resection of his P2 segment of the mitral valve at which time a 32 mm Edwards annuloplasty ring was inserted in 2010.  His most recent echo Doppler study from June 2015 after he  developed 2 transient neurologic symptoms continued to show normal systolic function with an ejection fraction of 60-65% with grade 2 diastolic dysfunction.  His valve remained competent without regurgitation.  He has a history of hypertension.  Recently his benazepril dose was increased and he is now taking 20 mg daily.  His blood pressure today is stable at 114/71, repeat by me.  He  is on simvastatin 40 mg daily for hyperlipidemia.  He has not had recent laboratory in a year.  In January 2015.  His LDL cholesterol was 105.  He denies any further neurologic symptoms.  He is unaware of any ectopy.  I am recommending complete set of laboratory be obtained in the fasting state.  He is now using CPAP with improved compliance with his new machine.  He denies awareness of residual snoring, frequent awakenings, and his sleep is now restorative.  I will see him in 6 months for cardiology reevaluation or sooner if problem arise.  Time spent: 25 minutes   Troy Sine, MD, [facility] 01/10/2015 4:58 PM

## 2015-01-10 NOTE — Telephone Encounter (Signed)
Rx refill sent to patient pharmacy   

## 2015-02-07 LAB — COMPREHENSIVE METABOLIC PANEL
ALT: 19 U/L (ref 0–53)
AST: 18 U/L (ref 0–37)
Albumin: 4.3 g/dL (ref 3.5–5.2)
Alkaline Phosphatase: 42 U/L (ref 39–117)
BUN: 13 mg/dL (ref 6–23)
CALCIUM: 9 mg/dL (ref 8.4–10.5)
CHLORIDE: 102 meq/L (ref 96–112)
CO2: 27 meq/L (ref 19–32)
Creat: 1.04 mg/dL (ref 0.50–1.35)
Glucose, Bld: 82 mg/dL (ref 70–99)
Potassium: 4.7 mEq/L (ref 3.5–5.3)
Sodium: 140 mEq/L (ref 135–145)
TOTAL PROTEIN: 6.8 g/dL (ref 6.0–8.3)
Total Bilirubin: 0.6 mg/dL (ref 0.2–1.2)

## 2015-02-07 LAB — CBC
HCT: 44.4 % (ref 39.0–52.0)
HEMOGLOBIN: 14.5 g/dL (ref 13.0–17.0)
MCH: 30.3 pg (ref 26.0–34.0)
MCHC: 32.7 g/dL (ref 30.0–36.0)
MCV: 92.7 fL (ref 78.0–100.0)
MPV: 9.6 fL (ref 8.6–12.4)
Platelets: 305 10*3/uL (ref 150–400)
RBC: 4.79 MIL/uL (ref 4.22–5.81)
RDW: 13.4 % (ref 11.5–15.5)
WBC: 4.7 10*3/uL (ref 4.0–10.5)

## 2015-02-07 LAB — LIPID PANEL
Cholesterol: 163 mg/dL (ref 0–200)
HDL: 32 mg/dL — AB (ref >=40)
LDL Cholesterol: 89 mg/dL (ref 0–99)
TRIGLYCERIDES: 212 mg/dL — AB (ref <150)
Total CHOL/HDL Ratio: 5.1 Ratio
VLDL: 42 mg/dL — AB (ref 0–40)

## 2015-02-07 LAB — TSH: TSH: 1.367 u[IU]/mL (ref 0.350–4.500)

## 2015-02-08 ENCOUNTER — Telehealth: Payer: Self-pay | Admitting: *Deleted

## 2015-02-08 NOTE — Telephone Encounter (Signed)
Informed patient of lab results and recommendations. Patient confirmed and voiced understanding of recommendations.

## 2015-02-08 NOTE — Telephone Encounter (Signed)
-----   Message from Lennette Biharihomas A Kelly, MD sent at 02/08/2015  8:16 AM EDT ----- Add fish oil  2 capsules daily to simvastatin 40 mg. Decrease carbs, sweets.

## 2015-02-24 ENCOUNTER — Telehealth: Payer: Self-pay | Admitting: *Deleted

## 2015-02-24 DIAGNOSIS — Z0289 Encounter for other administrative examinations: Secondary | ICD-10-CM

## 2015-02-24 DIAGNOSIS — I10 Essential (primary) hypertension: Secondary | ICD-10-CM

## 2015-02-24 NOTE — Telephone Encounter (Signed)
Called and informed the patient [place] [facility] [place] [facility] [place] he needs to have a exercise tolerance test. Per Dr. Tresa EndoKelly okay to order. Informed patient that the schedulers will call him with appointment date and time.

## 2015-02-27 ENCOUNTER — Telehealth (HOSPITAL_COMMUNITY): Payer: Self-pay | Admitting: *Deleted

## 2015-02-28 ENCOUNTER — Telehealth (HOSPITAL_COMMUNITY): Payer: Self-pay

## 2015-02-28 NOTE — Telephone Encounter (Signed)
Encounter complete. 

## 2015-03-01 ENCOUNTER — Ambulatory Visit (HOSPITAL_COMMUNITY)
Admission: RE | Admit: 2015-03-01 | Discharge: 2015-03-01 | Disposition: A | Discharge status: Home / Self Care | Payer: Medicare Other | Source: Ambulatory Visit | Attending: Cardiovascular Disease | Admitting: Cardiovascular Disease

## 2015-03-01 DIAGNOSIS — Z0289 Encounter for other administrative examinations: Secondary | ICD-10-CM | POA: Diagnosis not present

## 2015-03-01 DIAGNOSIS — I10 Essential (primary) hypertension: Secondary | ICD-10-CM

## 2015-03-01 DIAGNOSIS — R0600 Dyspnea, unspecified: Secondary | ICD-10-CM | POA: Diagnosis present

## 2015-03-01 NOTE — Procedures (Signed)
Exercise Treadmill Test  Pre-Exercise Testing Evaluation                  Test  Exercise Tolerance Test Ordering MD: Nicki Guadalajarahomas Kelly, MD  Interpreting MD:   Unique Test No: 1 Treadmill:  1  Indication for ETT: DOT Physical/Dyspnea  Contraindication to ETT: No   Stress Modality: exercise - treadmill  Cardiac Imaging Performed: non   Protocol: standard Bruce - maximal  Max BP:  189/105  Max MPHR (bpm):  151 85% MPR (bpm):  128  MPHR obtained (bpm):  134 % MPHR obtained:  88  Reached 85% MPHR (min:sec):  9:30 Total Exercise Time (min-sec):  9:41  Workload in METS:  11.20 Borg Scale:   Reason ETT Terminated:  fatigue, dyspnea    ST Segment Analysis At Rest: normal ST segments - no evidence of significant ST depression With Exercise: no evidence of significant ST depression  Other Information Arrhythmia:  No Angina during ETT:  absent (0) Quality of ETT:  diagnostic  ETT Interpretation:  normal - no evidence of ischemia by ST analysis  Comments: Nl GXT  Recommendations: Follow up with Dr. Tresa EndoKelly

## 2015-03-07 ENCOUNTER — Telehealth: Payer: Self-pay | Admitting: *Deleted

## 2015-03-07 NOTE — Telephone Encounter (Signed)
Informed patient that GXT is normal. 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

## 2015-03-10 ENCOUNTER — Telehealth: Payer: Self-pay | Admitting: *Deleted

## 2015-03-10 NOTE — Telephone Encounter (Signed)
Attempted to call patient to inform [facility] [place] [place] and [facility] for [place] up. No answer Or machine at home number.  Cell # no answer. VM not set up. Will try to call patient again later.

## 2015-07-27 ENCOUNTER — Other Ambulatory Visit: Payer: Self-pay | Admitting: Cardiovascular Disease

## 2015-07-27 NOTE — Telephone Encounter (Signed)
Rx(s) sent to pharmacy electronically.  

## 2015-08-01 ENCOUNTER — Ambulatory Visit (INDEPENDENT_AMBULATORY_CARE_PROVIDER_SITE_OTHER): Payer: Medicare Other | Admitting: Cardiovascular Disease

## 2015-08-01 VITALS — BP 120/82 | HR 53 | Ht 73.0 in | Wt 213.4 lb

## 2015-08-01 DIAGNOSIS — K219 Gastro-esophageal reflux disease without esophagitis: Secondary | ICD-10-CM

## 2015-08-01 DIAGNOSIS — Z9889 Other specified postprocedural states: Secondary | ICD-10-CM

## 2015-08-01 DIAGNOSIS — Z79899 Other long term (current) drug therapy: Secondary | ICD-10-CM | POA: Diagnosis not present

## 2015-08-01 DIAGNOSIS — I10 Essential (primary) hypertension: Secondary | ICD-10-CM

## 2015-08-01 DIAGNOSIS — E785 Hyperlipidemia, unspecified: Secondary | ICD-10-CM

## 2015-08-01 DIAGNOSIS — R001 Bradycardia, unspecified: Secondary | ICD-10-CM

## 2015-08-01 DIAGNOSIS — G4733 Obstructive sleep apnea (adult) (pediatric): Secondary | ICD-10-CM

## 2015-08-01 DIAGNOSIS — Z9989 Dependence on other enabling machines and devices: Secondary | ICD-10-CM

## 2015-08-01 NOTE — Patient Instructions (Signed)
Your physician has requested that you have an echocardiogram. Echocardiography is a painless test that uses sound waves to create images of your heart. It provides your doctor with information about the size and shape of your heart and how well your heart's chambers and valves are working. This procedure takes approximately one hour. There are no restrictions for this procedure. This will be done in 6 months.  Your physician recommends that you return for lab work in: 2 weeks fasting.  Your physician wants you to follow-up in: 6 months or sooner if needed You will receive a reminder letter in the mail two months in advance. If you don't receive a letter, please call our office to schedule the follow-up appointment.

## 2015-08-02 ENCOUNTER — Encounter: Payer: Self-pay | Admitting: Cardiovascular Disease

## 2015-08-02 NOTE — Progress Notes (Signed)
Patient ID: Edgar CAMPOY, male   DOB: 12-Jul-1946, 69 y.o.   MRN: [id number]    Primary M.D.: Dr. Juanita Craver  HPI: Edgar Jones is a 69 year old former a patient of Dr. Rollene Fare. He presents for a six-month cardiology follow-up evaluation.  Edgar Jones underwent mitral valve repair or in June 2010 with a 32 mm Edwards annuplasty plasty ring and quadrangular resection of P2 segment of the mitral valve which was done by Dr. Roxan Hockey. 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 At that time he had normal coronary arteries. He has a history of obstructive sleep apnea since 2010 and has been on CPAP therapy and admits to 100% use. There is also a history of hypertension as well as hyperlipidemia in addition to GERD.  When I saw initially saw him in January 2015, he denied any episodes of chest pain. He noted mild episodes intermittently and feels at times she is unable to get a deep breath. His last echo Doppler study  in April 2014 which confirmed normal systolic function with an ejection fraction of 55-60%. Left atrium was mildly dilated. His mitral valve repair and anoplasty wearing were stable there was no evidence for prolapse and only trivial regurgitation. He had mild TR.  Prior to initially seeing him in September 2015 he had experienced 2 transient events associated with transient right hand twitching and weakness as well as another episode of "total "vision.  He he denied any dizziness or he saw Dr. Floyde Parkins.  Carotid studies did not demonstrate any significant abnormality.  An MRI of his brain did not reveal any acute intracranial abnormality.  There was expected evolution of the left occipital lobe hemorrhage and small right parietal cortex infarct, which previously had been detected in 2010.  There was evidence for mild interval increase microhemorrhages in both cerebral hemispheres.  A repeat echo Doppler study was also  done in June 2015, which showed an ejection fraction in the range of 60-65%.  There was probable to diastolic dysfunction.  There is trivial aortic regurgitation.  His mitral valve repair was intact with moderately thickened leaflets.  His atrium was dilated.  His right ventricle was mildly dilated.  It was felt by Dr. Jannifer Franklin that he possibly he may have had a TIA, but this was not definitive.  The possibility of a future loop recorder was discussed if the patient had recurrent symptomatology.  The patient denies any recurrent symptoms.  He denies any awareness of any palpitations.  He denies chest pain.  He denies fever or shortness of breath.  He has a history of hypertension and has been on but hasn't filled 20 mg.  He has hyperlipidemia on simvastatin 40 mg.  There is a history of GERD for which she is on Prilosec.  He also has obstructive sleep apnea and continues to use CPAP with 100% compliance.  He presents for evaluation.  Past Medical History  Diagnosis Date  . GERD (gastroesophageal reflux disease)   . Heart murmur   . Endocarditis   . Hypertension   . Dyslipidemia   . Stroke     3/10 endocarditis/embolic  . SBE (subacute bacterial endocarditis)   . OSA on CPAP   . Hx of echocardiogram 02/10/2013    The cavity size was normal. Ststolic function was normal. The estimated EF was in the range 55%-60%. Wall motion was normal; there was no regional wall motion abnormalities.  Stable MV repair & annuloplasty ring. Trace Edgar  . History of stress test 11/22/2010    Perfusion defect in the inferior myocardial region is consistent with diaphragmatic attenuation. The remaining myocardium demonstrates normal myocardial perfusion with no evidence of ischemia or infarct. The post stress left ventricle is normal in size. The post stress EF 65%, Gobal left ventricle systolic function is normal. No significant wall motion abnormalities noted. Normal Myocardial perfus    Past Surgical History  Procedure  Laterality Date  . Mitral valve repair  04/2009    54m Edwards annuloplasty ring and quadrangular resection pf P2 segment of the motral valve by Dr HRoxan Hockeywith normal coronary arteries.  . Inguinal hernia repair      left side  . Hernia repair      right abd  . Upper gastrointestinal endoscopy    . Polypectomy    . Colonoscopy    . Cardiac catheterization  01/2009    No Known Allergies  Current Outpatient Prescriptions  Medication Sig Dispense Refill  . aspirin EC 81 MG tablet Take 81 mg by mouth daily.    . benazepril (LOTENSIN) 20 MG tablet Take 1 tablet by mouth daily.  6  . Omega-3 Fatty Acids (FISH OIL) 1000 MG CAPS Take 2 capsules by mouth daily.    .Marland Kitchenomeprazole (PRILOSEC) 40 MG capsule TAKE ONE CAPSULE BY MOUTH EVERY DAY 30 capsule 9  . simvastatin (ZOCOR) 40 MG tablet Take 1 tablet (40 mg total) by mouth at bedtime. 90 tablet 1   No current facility-administered medications for this visit.    Socially he is married has 3 children and 2 grandchildren. There is a remote tobacco history but he quit over 30 years ago. 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 He does play golf and rides a bike.  Family History  Problem Relation Age of Onset  . Heart disease Mother   . Heart disease Father   . Arthritis Father   . Cirrhosis Brother   . Hypertension Maternal Aunt   . Hypertension Brother   . Hyperlipidemia Brother     ROS General: Negative; No fevers, chills, or night sweats;  HEENT: Negative; No changes in vision or hearing, sinus congestion, difficulty swallowing Pulmonary: Negative; No cough, wheezing, shortness of breath, hemoptysis Cardiovascular: Negative; No chest pain, presyncope, syncope, palpitations GI: Negative; No nausea, vomiting, diarrhea, or abdominal pain GU: Negative; No dysuria, hematuria, or difficulty voiding Musculoskeletal: Negative; no  myalgias, joint pain, or weakness Hematologic/Oncology: Negative; no easy bruising, bleeding Endocrine: Negative; no heat/cold intolerance; no diabetes Neuro: See history of present illness; no changes in balance, headaches Skin: Negative; No rashes or skin lesions Psychiatric: Negative; No behavioral problems, depression Sleep: Positive for sleep apnea on CPAP therapy; No snoring, daytime sleepiness, hypersomnolence, bruxism, restless legs, hypnogognic hallucinations, no cataplexy Other comprehensive 14 point system review is negative.   PE BP 120/82 mmHg  Pulse 53  Ht '6\' 1"'  (1.854 m)  Wt 213 lb 6.4 oz (96.798 kg)  BMI 28.16 kg/m2   Wt Readings from Last 3 Encounters:  08/01/15 213 lb 6.4 oz (96.798 kg)  01/10/15 219 lb 8 oz (99.565 kg)  07/19/14 214 lb 9.6 oz (97.342 kg)   General: Alert, oriented, no distress.  Skin: normal turgor, no rashes HEENT: Normocephalic, atraumatic. Pupils round and reactive; sclera anicteric; extraocular muscles intact; Fundi without hemorrhages or exudates.  No xanthelasmas Nose without nasal septal hypertrophy Mouth/Parynx benign; Mallinpatti scale 3 Neck: No JVD, no carotid bruits; normal carotid upstroke Lungs: clear to ausculatation and percussion; no wheezing or rales Chest wall: without tenderness to palpitation Heart: RRR, s1 s2 normal 2/6 systolic murmur at the left sternal border radiating to the apex. No S3 or S4 gallop. Abdomen: soft, nontender; no hepatosplenomehaly, BS+; abdominal aorta nontender and not dilated by palpation. Back: no CVA tenderness Pulses 2+ Extremities: no clubbinbg cyanosis or edema, Homan's sign negative  Neurologic: grossly nonfocal; Cranial nerves grossly wnl Psychologic: Normal mood and affect  ECG (independently read by me): Sinus bradycardia 53 bpm.  First-degree AV block with a PR interval at 224 ms.  ECG (independently read by me): Normal sinus rhythm at 63 bpm.  Occasional PVC.  First-degree AV block with  a PR interval at 212.   September 2015 ECG (independently read by me): Sinus bradycardia 51 beats per minute.  Borderline first-degree V. block with a PR interval of 208 ms.  QTc interval 411 milliseconds  November 23, 2013 ECG (independently read by me): Sinus bradycardia with very mild first-degree block with a PR interval of 218 ms. No significant ST changes.  LABS:  BMP Latest Ref Rng 02/06/2015 04/26/2014 11/23/2013  Glucose 70 - 99 mg/dL 82 100(H) 77  BUN 6 - 23 mg/dL '13 15 13  ' Creatinine 0.50 - 1.35 mg/dL 1.04 1.01 0.92  Sodium 135 - 145 mEq/L 140 140 139  Potassium 3.5 - 5.3 mEq/L 4.7 4.2 4.8  Chloride 96 - 112 mEq/L 102 101 104  CO2 19 - 32 mEq/L '27 21 28  ' Calcium 8.4 - 10.5 mg/dL 9.0 9.6 9.5   Hepatic Function Latest Ref Rng 02/06/2015 04/26/2014 11/23/2013  Total Protein 6.0 - 8.3 g/dL 6.8 7.6 6.9  Albumin 3.5 - 5.2 g/dL 4.3 4.4 4.3  AST 0 - 37 U/L '18 24 19  ' ALT 0 - 53 U/L '19 27 21  ' Alk Phosphatase 39 - 117 U/L 42 53 43  Total Bilirubin 0.2 - 1.2 mg/dL 0.6 0.4 0.5   CBC Latest Ref Rng 02/06/2015 04/26/2014 11/23/2013  WBC 4.0 - 10.5 K/uL 4.7 6.8 6.4  Hemoglobin 13.0 - 17.0 g/dL 14.5 14.4 14.4  Hematocrit 39.0 - 52.0 % 44.4 43.6 43.1  Platelets 150 - 400 K/uL 305 265 271   Lab Results  Component Value Date   MCV 92.7 02/06/2015   MCV 91.8 04/26/2014   MCV 90.2 11/23/2013   Lab Results  Component Value Date   TSH 1.367 02/06/2015   Lab Results  Component Value Date   HGBA1C  05/08/2009    4.6 (NOTE) The ADA recommends the following therapeutic goal for glycemic control related to Hgb A1c measurement: Goal of therapy: <6.5 Hgb A1c  Reference: American Diabetes Association: Clinical Practice Recommendations 2010, Diabetes Care, 2010, 33: (Suppl  1).   BNP No results found for: PROBNP  Lipid Panel     Component Value Date/Time   CHOL 163 02/06/2015 1019   TRIG 212* 02/06/2015 1019   HDL 32* 02/06/2015 1019   CHOLHDL 5.1 02/06/2015 1019   VLDL 42* 02/06/2015  1019   LDLCALC 89 02/06/2015 1019     RADIOLOGY: No results found.   ASSESSMENT AND PLAN: Edgar. Edgar Jones a 69 year old gentleman who has a remote history of SBE and mitral valve endocarditis for which he required mitral valve repair surgery with quadrangular resection of his P2 segment of the mitral valve at which time a  32 mm Edwards annuloplasty ring was inserted in 2010.  His last  echo Doppler study from June 2015 after he developed 2 transient neurologic symptoms continued to show normal systolic function with an ejection fraction of 60-65% with grade 2 diastolic dysfunction.  His valve remained competent without regurgitation.  He has a history of hypertension and his blood pressure is well controlled on benazepril 20 mg.  He does have sinus bradycardia and first-degree AV block but is not taking any rate control were negative chronotropic medication.  He is on simvastatin 40 mg with target LDL less than 70.  He continues to use CPAP with 100% compliance and specifically denies breakthrough snoring, residual daytime sleepiness.  He has not had recent blood work, and a complete set of fasting labs will be obtained in 1-2 weeks.  Adjustments to his medications will be made if necessary.  In 6 months, I am scheduling him for follow-up echo Doppler study and office visit.  Time spent: 25 minutes   Edgar Sine, MD, [facility] 08/02/2015 5:54 PM

## 2015-08-09 LAB — CBC
HCT: 43.1 % (ref 39.0–52.0)
Hemoglobin: 14 g/dL (ref 13.0–17.0)
MCH: 30 pg (ref 26.0–34.0)
MCHC: 32.5 g/dL (ref 30.0–36.0)
MCV: 92.3 fL (ref 78.0–100.0)
MPV: 9.5 fL (ref 8.6–12.4)
PLATELETS: 319 10*3/uL (ref 150–400)
RBC: 4.67 MIL/uL (ref 4.22–5.81)
RDW: 13.5 % (ref 11.5–15.5)
WBC: 4.4 10*3/uL (ref 4.0–10.5)

## 2015-08-10 LAB — COMPREHENSIVE METABOLIC PANEL
ALT: 21 U/L (ref 9–46)
AST: 23 U/L (ref 10–35)
Albumin: 4.4 g/dL (ref 3.6–5.1)
Alkaline Phosphatase: 45 U/L (ref 40–115)
BUN: 16 mg/dL (ref 7–25)
CHLORIDE: 105 mmol/L (ref 98–110)
CO2: 27 mmol/L (ref 20–31)
Calcium: 9.4 mg/dL (ref 8.6–10.3)
Creat: 1.01 mg/dL (ref 0.70–1.25)
GLUCOSE: 90 mg/dL (ref 65–99)
POTASSIUM: 5.3 mmol/L (ref 3.5–5.3)
Sodium: 137 mmol/L (ref 135–146)
Total Bilirubin: 0.5 mg/dL (ref 0.2–1.2)
Total Protein: 6.9 g/dL (ref 6.1–8.1)

## 2015-08-10 LAB — LIPID PANEL
CHOL/HDL RATIO: 4.4 ratio (ref <=5.0)
CHOLESTEROL: 157 mg/dL (ref 125–200)
HDL: 36 mg/dL — ABNORMAL LOW (ref >=40)
LDL CALC: 99 mg/dL (ref <130)
Triglycerides: 109 mg/dL (ref <150)
VLDL: 22 mg/dL (ref <30)

## 2015-08-10 LAB — TSH: TSH: 1.184 u[IU]/mL (ref 0.350–4.500)

## 2015-08-20 ENCOUNTER — Other Ambulatory Visit: Payer: Self-pay | Admitting: Cardiovascular Disease

## 2015-08-21 ENCOUNTER — Telehealth: Payer: Self-pay | Admitting: *Deleted

## 2015-08-21 NOTE — Telephone Encounter (Signed)
REFILL 

## 2015-08-21 NOTE — Telephone Encounter (Signed)
-----   Message from Lennette Bihari, MD sent at 08/20/2015  1:08 PM EDT ----- Labs good

## 2015-08-21 NOTE — Telephone Encounter (Signed)
Called and notified patient of lab results.

## 2015-11-02 ENCOUNTER — Other Ambulatory Visit: Payer: Self-pay | Admitting: Cardiovascular Disease

## 2015-11-02 MED ORDER — SIMVASTATIN 40 MG PO TABS
40.0000 mg | ORAL_TABLET | Freq: Every day | ORAL | Status: DC
Start: 2015-11-02 — End: 2016-08-19

## 2015-11-02 NOTE — Telephone Encounter (Signed)
°*  STAT* If patient is at the pharmacy, call can be transferred to refill team.   1. Which medications need to be refilled? (please list name of each medication and dose if known) Simvastatin  2. Which pharmacy/location (including street and city if local pharmacy) is medication to be sent to?[facility]- [place] 3. Do they need a 30 day or 90 day supply? 90 and refills

## 2015-11-02 NOTE — Telephone Encounter (Signed)
E-sent to pharmacy Patient aware Appointment schedule for 01/2016 Patient states he does not need prescription now he had a refill available.  pharmacy will keep on file.

## 2016-01-17 ENCOUNTER — Encounter: Payer: Self-pay | Admitting: Neurology

## 2016-01-17 ENCOUNTER — Other Ambulatory Visit: Payer: Self-pay

## 2016-01-17 ENCOUNTER — Ambulatory Visit (INDEPENDENT_AMBULATORY_CARE_PROVIDER_SITE_OTHER): Payer: Medicare Other | Admitting: Neurology

## 2016-01-17 ENCOUNTER — Ambulatory Visit (HOSPITAL_COMMUNITY): Payer: Medicare Other | Attending: Cardiology

## 2016-01-17 VITALS — BP 150/90 | HR 66 | Resp 20 | Ht 73.0 in | Wt 219.0 lb

## 2016-01-17 DIAGNOSIS — I059 Rheumatic mitral valve disease, unspecified: Secondary | ICD-10-CM | POA: Diagnosis present

## 2016-01-17 DIAGNOSIS — I119 Hypertensive heart disease without heart failure: Secondary | ICD-10-CM | POA: Diagnosis not present

## 2016-01-17 DIAGNOSIS — I10 Essential (primary) hypertension: Secondary | ICD-10-CM | POA: Diagnosis not present

## 2016-01-17 DIAGNOSIS — Z87891 Personal history of nicotine dependence: Secondary | ICD-10-CM | POA: Diagnosis not present

## 2016-01-17 DIAGNOSIS — G458 Other transient cerebral ischemic attacks and related syndromes: Secondary | ICD-10-CM | POA: Diagnosis not present

## 2016-01-17 DIAGNOSIS — E785 Hyperlipidemia, unspecified: Secondary | ICD-10-CM | POA: Diagnosis not present

## 2016-01-17 DIAGNOSIS — I352 Nonrheumatic aortic (valve) stenosis with insufficiency: Secondary | ICD-10-CM | POA: Diagnosis not present

## 2016-01-17 LAB — ECHOCARDIOGRAM COMPLETE
Height: 73 in
Weight: 3504 oz

## 2016-01-17 NOTE — Progress Notes (Signed)
Reason for visit: History of cerebrovascular disease  Edgar Jones is an 70 y.o. male  History of present illness:  Edgar Jones is a 70 year old right-handed white male with a history of cerebrovascular disease. The patient has a history of endocarditis associated with the mitral valve repair in 2010. The patient was seen in June 2015, he has had 2 recent TIA events, 1 was associated with a transient visual disturbance on the left, the other was associated with transient right arm weakness. The patient has done quite well since that time, he has not had any events of numbness, weakness, headache, vision changes, or problems controlling the bowels or the bladder. He denies any balance issues. 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 He denies any other significant medical issues that have come up since last seen.  Past Medical History  Diagnosis Date  . GERD (gastroesophageal reflux disease)   . Heart murmur   . Endocarditis   . Hypertension   . Dyslipidemia   . Stroke (HCC)     3/10 endocarditis/embolic  . SBE (subacute bacterial endocarditis) (HCC)   . OSA on CPAP   . Hx of echocardiogram 02/10/2013    The cavity size was normal. Ststolic function was normal. The estimated EF was in the range 55%-60%. Wall motion was normal; there was no regional wall motion abnormalities. Stable MV repair & annuloplasty ring. Trace MR  . History of stress test 11/22/2010    Perfusion defect in the inferior myocardial region is consistent with diaphragmatic attenuation. The remaining myocardium demonstrates normal myocardial perfusion with no evidence of ischemia or infarct. The post stress left ventricle is normal in size. The post stress EF 65%, Gobal left ventricle systolic function is normal. No significant wall motion abnormalities noted. Normal Myocardial perfus    Past Surgical History  Procedure Laterality Date  . Mitral valve repair   04/2009    32mm Edwards annuloplasty ring and quadrangular resection pf P2 segment of the motral valve by Dr Dorris Fetch with normal coronary arteries.  . Inguinal hernia repair      left side  . Hernia repair      right abd  . Upper gastrointestinal endoscopy    . Polypectomy    . Colonoscopy    . Cardiac catheterization  01/2009    Family History  Problem Relation Age of Onset  . Heart disease Mother   . Heart disease Father   . Arthritis Father   . Cirrhosis Brother   . Hypertension Maternal Aunt   . Hypertension Brother   . Hyperlipidemia Brother     Social history:  reports that he quit smoking about 34 years ago. His smoking use included Cigarettes. He has a 40 pack-year smoking history. He has never used smokeless tobacco. He reports that he drinks about 7.0 oz of alcohol per week. He reports that he does not use illicit drugs.   No Known Allergies  Medications:  Prior to Admission medications   Medication Sig Start Date End Date Taking? Authorizing Provider  aspirin EC 81 MG tablet Take 81 mg by mouth daily.   Yes Historical Provider, MD  benazepril (LOTENSIN) 20 MG tablet Take 1 tablet by mouth daily. 11/22/14  Yes Historical Provider, MD  Omega-3 Fatty Acids (FISH OIL) 1000 MG CAPS Take 2 capsules by mouth daily.   Yes Historical Provider, MD  omeprazole (PRILOSEC) 40 MG capsule TAKE ONE CAPSULE BY MOUTH EVERY  DAY 08/21/15  Yes Lennette Biharihomas A Kelly, MD  simvastatin (ZOCOR) 40 MG tablet Take 1 tablet (40 mg total) by mouth at bedtime. 11/02/15  Yes Lennette Biharihomas A Kelly, MD    ROS:  Out of a complete 14 system review of symptoms, the patient complains only of the following symptoms, and all other reviewed systems are negative.  Shortness of breath Heart murmur Sleep apnea Difficulty urinating, frequency of urination  Blood pressure 150/90, pulse 66, resp. rate 20, height 6\' 1"  (1.854 m), weight 219 lb (99.338 kg).  Physical Exam  General: The patient is alert and  cooperative at the time of the examination.  Skin: No significant peripheral edema is noted.   Neurologic Exam  Mental status: The patient is alert and oriented x 3 at the time of the examination. The patient has apparent normal recent and remote memory, with an apparently normal attention span and concentration ability.   Cranial nerves: Facial symmetry is present. Speech is normal, no aphasia or dysarthria is noted. Extraocular movements are full. Visual fields are full. Pupils are equal, round, and reactive to light. Discs are flat bilaterally.  Motor: The patient has good strength in all 4 extremities.  Sensory examination: Soft touch sensation is symmetric on the face, arms, and legs.  Coordination: The patient has good finger-nose-finger and heel-to-shin bilaterally.  Gait and station: The patient has a normal gait. Tandem gait is normal. Romberg is negative. No drift is seen.  Reflexes: Deep tendon reflexes are symmetric.   Assessment/Plan:  1. History of cerebrovascular disease  The patient is doing well, he is been very stable neurologically since June 2015. At this point, there are no restrictions with his driving. I have dictated a note to this regard. The patient may follow-up through this office on an as-needed basis. He is to remain on low-dose aspirin.   Edgar Palau. Keith Laurali Goddard MD 01/17/2016 8:21 PM  [facility] [address] [place], [place] [id number]  Phone [phone] Fax [phone]

## 2016-01-30 ENCOUNTER — Encounter: Payer: Self-pay | Admitting: Cardiovascular Disease

## 2016-01-30 ENCOUNTER — Ambulatory Visit (INDEPENDENT_AMBULATORY_CARE_PROVIDER_SITE_OTHER): Payer: Medicare Other | Admitting: Cardiovascular Disease

## 2016-01-30 VITALS — BP 118/92 | HR 50 | Ht 73.0 in | Wt 220.3 lb

## 2016-01-30 DIAGNOSIS — I10 Essential (primary) hypertension: Secondary | ICD-10-CM

## 2016-01-30 DIAGNOSIS — Z9889 Other specified postprocedural states: Secondary | ICD-10-CM | POA: Diagnosis not present

## 2016-01-30 DIAGNOSIS — R001 Bradycardia, unspecified: Secondary | ICD-10-CM

## 2016-01-30 DIAGNOSIS — Z9989 Dependence on other enabling machines and devices: Secondary | ICD-10-CM

## 2016-01-30 DIAGNOSIS — E785 Hyperlipidemia, unspecified: Secondary | ICD-10-CM

## 2016-01-30 DIAGNOSIS — G4733 Obstructive sleep apnea (adult) (pediatric): Secondary | ICD-10-CM | POA: Diagnosis not present

## 2016-01-30 NOTE — Progress Notes (Signed)
Patient ID: Edgar Jones, male   DOB: Feb 01, 1946, 70 y.o.   MRN: [id number]    Primary M.D.: Dr. Juanita Craver  HPI: Edgar Edgar Jones is a 70 year old former a patient of Dr. Rollene Fare. He presents for a six-month cardiology follow-up evaluation.  Edgar. Jones underwent mitral valve repair or in June 2010 with a 32 mm Edwards annuplasty plasty ring and quadrangular resection of P2 segment of the mitral valve which was done by Dr. Roxan Hockey. 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 At that time he had normal coronary arteries. He has a history of obstructive sleep apnea since 2010 and has been on CPAP therapy and admits to 100% use. There is also a history of hypertension as well as hyperlipidemia in addition to GERD.  When I saw initially saw him in January 2015, he denied any episodes of chest pain. He noted mild episodes intermittently and feels at times she is unable to get a deep breath. An echo Doppler study in April 2014 confirmed normal systolic function with an ejection fraction of 55-60%. Left atrium was mildly dilated. His mitral valve repair and annuloplasty ring were stable there was no evidence for prolapse and only trivial regurgitation. He had mild TR.  Prior to initially seeing him in September 2015 he had experienced 2 transient events associated with transient right hand twitching and weakness as well as another episode of "total "vision.  He he denied any dizziness or he saw Dr. Floyde Parkins.  Carotid studies did not demonstrate any significant abnormality.  An MRI of his brain did not reveal any acute intracranial abnormality.  There was expected evolution of the left occipital lobe hemorrhage and small right parietal cortex infarct, which previously had been detected in 2010.  There was evidence for mild interval increase microhemorrhages in both cerebral hemispheres.  A repeat echo Doppler study was also done in June  2015, which showed an ejection fraction in the range of 60-65%.  There was probable to diastolic dysfunction.  There is trivial aortic regurgitation.  His mitral valve repair was intact with moderately thickened leaflets.  His atrium was dilated.  His right ventricle was mildly dilated.  It was felt by Dr. Jannifer Franklin that he possibly he may have had a TIA, but this was not definitive.  The possibility of a future loop recorder was discussed if the patient had recurrent symptomatology.  The patient denies any recurrent symptoms.  He denies any awareness of any palpitations.  He denies chest pain.  He denies fever or shortness of breath.  He has a history of hypertension and has been on but hasn't filled 20 mg.  He has hyperlipidemia on simvastatin 40 mg.  There is a history of GERD for which she is on Prilosec.  He also has obstructive sleep apnea and continues to use CPAP with 100% compliance.   Over the past 6 months, he has continued to remain stable.  He denies chest pain.  He doesn't mild shortness of breath occasionally, which is not typically exertionally precipitated.  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  He has a history of GERD but this has been controlled with omeprazole.  He has been taking simvastatin 40 mg daily for hyperlipidemia. He has been on been  omeprazole 20 mg daily for high blood pressure.  Several years ago his triglycerides were elevated and  omega-3 fatty acid was added to his lipid lowering therapy.    On 01/17/2016 he underwent a follow-up echo Doppler study. This showed an EF of 60-65% with mild LVH. There was very mild aortic stenosis with a mean gradient of 11 mm. Aortic root measured 38 mm. He had severe left atrial dilatation and moderate right atrial dilatation..  Pulmonary pressures were normal with a PA pressure at 27 mm.  His mitral valve repair was stable without significant stenosis or regurgitation. He presents for  evaluation.   Past Medical History  Diagnosis Date  . GERD (gastroesophageal reflux disease)   . Heart murmur   . Endocarditis   . Hypertension   . Dyslipidemia   . Stroke ([place])     3/10 endocarditis/embolic  . SBE (subacute bacterial endocarditis) ([place])   . OSA on CPAP   . Hx of echocardiogram 02/10/2013    The cavity size was normal. Ststolic function was normal. The estimated EF was in the range 55%-60%. Wall motion was normal; there was no regional wall motion abnormalities. Stable MV repair & annuloplasty ring. Trace Edgar  . History of stress test 11/22/2010    Perfusion defect in the inferior myocardial region is consistent with diaphragmatic attenuation. The remaining myocardium demonstrates normal myocardial perfusion with no evidence of ischemia or infarct. The post stress left ventricle is normal in size. The post stress EF 65%, Gobal left ventricle systolic function is normal. No significant wall motion abnormalities noted. Normal Myocardial perfus    Past Surgical History  Procedure Laterality Date  . Mitral valve repair  04/2009    68m Edwards annuloplasty ring and quadrangular resection pf P2 segment of the motral valve by Dr HRoxan Hockeywith normal coronary arteries.  . Inguinal hernia repair      left side  . Hernia repair      right abd  . Upper gastrointestinal endoscopy    . Polypectomy    . Colonoscopy    . Cardiac catheterization  01/2009    No Known Allergies  Current Outpatient Prescriptions  Medication Sig Dispense Refill  . aspirin EC 81 MG tablet Take 81 mg by mouth daily.    . benazepril (LOTENSIN) 20 MG tablet Take 1 tablet by mouth daily.  6  . Omega-3 Fatty Acids (FISH OIL) 1000 MG CAPS Take 2 capsules by mouth daily.    .Marland Kitchenomeprazole (PRILOSEC) 40 MG capsule TAKE ONE CAPSULE BY MOUTH EVERY DAY 30 capsule 6  . simvastatin (ZOCOR) 40 MG tablet Take 1 tablet (40 mg total) by mouth at bedtime. 90 tablet 1   No current facility-administered  medications for this visit.    Socially he is married has 3 children and 2 grandchildren. There is a remote tobacco history but he quit over 30 years ago. 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 He does play golf and rides a bike.  Family History  Problem Relation Age of Onset  . Heart disease Mother   . Heart disease Father   . Arthritis Father   . Cirrhosis Brother   . Hypertension Maternal Aunt   . Hypertension Brother   . Hyperlipidemia Brother     ROS General: Negative; No fevers, chills, or night sweats;  HEENT: Negative; No changes in vision or hearing, sinus congestion, difficulty swallowing Pulmonary: Negative; No cough, wheezing, shortness of breath, hemoptysis Cardiovascular: Negative; No chest pain, presyncope,  syncope, palpitations GI: Negative; No nausea, vomiting, diarrhea, or abdominal pain GU: Negative; No dysuria, hematuria, or difficulty voiding Musculoskeletal: Negative; no myalgias, joint pain, or weakness Hematologic/Oncology: Negative; no easy bruising, bleeding Endocrine: Negative; no heat/cold intolerance; no diabetes Neuro: See history of present illness; no changes in balance, headaches Skin: Negative; No rashes or skin lesions Psychiatric: Negative; No behavioral problems, depression Sleep: Positive for sleep apnea on CPAP therapy;  He admits to 100% compliance; No snoring, daytime sleepiness, hypersomnolence, bruxism, restless legs, hypnogognic hallucinations, no cataplexy Other comprehensive 14 point system review is negative.   PE BP 118/92 mmHg  Pulse 50  Ht '6\' 1"'  (1.854 m)  Wt 220 lb 5 oz (99.933 kg)  BMI 29.07 kg/m2   Repeat BP by me 128/80  Wt Readings from Last 3 Encounters:  01/30/16 220 lb 5 oz (99.933 kg)  01/17/16 219 lb (99.338 kg)  08/01/15 213 lb 6.4 oz (96.798 kg)   General: Alert, oriented, no distress.  Skin: normal turgor,  no rashes HEENT: Normocephalic, atraumatic. Pupils round and reactive; sclera anicteric; extraocular muscles intact; Fundi without hemorrhages or exudates. No xanthelasmas Nose without nasal septal hypertrophy Mouth/Parynx benign; Mallinpatti scale 3 Neck: No JVD, no carotid bruits; normal carotid upstroke Lungs: clear to ausculatation and percussion; no wheezing or rales Chest wall: without tenderness to palpitation Heart: RRR, s1 s2 normal 2/6 systolic murmur at the left sternal border radiating to the apex. No S3 or S4 gallop. Abdomen: soft, nontender; no hepatosplenomehaly, BS+; abdominal aorta nontender and not dilated by palpation. Back: no CVA tenderness Pulses 2+ Extremities: no clubbinbg cyanosis or edema, Homan's sign negative  Neurologic: grossly nonfocal; Cranial nerves grossly wnl Psychologic: Normal mood and affect  ECG (independently read by me):  Sinus bradycardia 50 bpm with first-degree block.  No ectopy. Mild T wave abnormality inferiorly.  September 2016 ECG (independently read by me): Sinus bradycardia 53 bpm.  First-degree AV block with a PR interval at 224 ms.  ECG (independently read by me): Normal sinus rhythm at 63 bpm.  Occasional PVC.  First-degree AV block with a PR interval at 212.  September 2015 ECG (independently read by me): Sinus bradycardia 51 beats per minute.  Borderline first-degree V. block with a PR interval of 208 ms.  QTc interval 411 milliseconds  November 23, 2013 ECG (independently read by me): Sinus bradycardia with very mild first-degree block with a PR interval of 218 ms. No significant ST changes.  LABS:  BMP Latest Ref Rng 08/09/2015 02/06/2015 04/26/2014  Glucose 65 - 99 mg/dL 90 82 100(H)  BUN 7 - 25 mg/dL '16 13 15  ' Creatinine 0.70 - 1.25 mg/dL 1.01 1.04 1.01  Sodium 135 - 146 mmol/L 137 140 140  Potassium 3.5 - 5.3 mmol/L 5.3 4.7 4.2  Chloride 98 - 110 mmol/L 105 102 101  CO2 20 - 31 mmol/L '27 27 21  ' Calcium 8.6 - 10.3 mg/dL 9.4 9.0  9.6   Hepatic Function Latest Ref Rng 08/09/2015 02/06/2015 04/26/2014  Total Protein 6.1 - 8.1 g/dL 6.9 6.8 7.6  Albumin 3.6 - 5.1 g/dL 4.4 4.3 4.4  AST 10 - 35 U/L '23 18 24  ' ALT 9 - 46 U/L '21 19 27  ' Alk Phosphatase 40 - 115 U/L 45 42 53  Total Bilirubin 0.2 - 1.2 mg/dL 0.5 0.6 0.4   CBC Latest Ref Rng 08/09/2015 02/06/2015 04/26/2014  WBC 4.0 - 10.5 K/uL 4.4 4.7 6.8  Hemoglobin 13.0 - 17.0 g/dL 14.0 14.5 14.4  Hematocrit  39.0 - 52.0 % 43.1 44.4 43.6  Platelets 150 - 400 K/uL 319 305 265   Lab Results  Component Value Date   MCV 92.3 08/09/2015   MCV 92.7 02/06/2015   MCV 91.8 04/26/2014   Lab Results  Component Value Date   TSH 1.184 08/09/2015   Lab Results  Component Value Date   HGBA1C  05/08/2009    4.6 (NOTE) The ADA recommends the following therapeutic goal for glycemic control related to Hgb A1c measurement: Goal of therapy: <6.5 Hgb A1c  Reference: American Diabetes Association: Clinical Practice Recommendations 2010, Diabetes Care, 2010, 33: (Suppl  1).   BNP No results found for: PROBNP  Lipid Panel     Component Value Date/Time   CHOL 157 08/09/2015 0934   TRIG 109 08/09/2015 0934   HDL 36* 08/09/2015 0934   CHOLHDL 4.4 08/09/2015 0934   VLDL 22 08/09/2015 0934   LDLCALC 99 08/09/2015 0934     RADIOLOGY: No results found.   ASSESSMENT AND PLAN: Edgar. Edgar Jones a 70 year old gentleman who has a remote history of SBE and mitral valve endocarditis for which he required mitral valve repair surgery with quadrangular resection of his P2 segment of the mitral valve at which time a 32 mm Edwards annuloplasty ring was inserted in 2010.  I reviewed his most recent echo Doppler study which continues to show  A stable mitral valve repair without stenosis or regurgitation.  He had normal systolic function with mild LVH.  There was evidence for mild aortic stenosis with a mean gradient of 11.  There was evidence for biatrial enlargement.  He is maintaining sinus rhythm  and is asymptomatic despite his sinus bradycardia.  His blood pressure today is well controlled.  He is not on any negative chronotropic medications.  He continues to be on simvastatin and omega-3 fatty acids for his hyperlipidemia.  He admits to 1% compliance with reference to his obstructive sleep apnea on CPAP therapy.  He denies residual daytime sleepiness or awareness of breakthrough snoring.  His GERD is controlled with omeprazole.  He sees Dr. Juanita Craver, for primary care.  We will write a letter to allow him to continue to drive his dump truck. As long as he is stable, I will see him in one year for reevaluation.  Time spent: 25 minutes  Troy Sine, MD, [facility] 01/30/2016 6:58 PM

## 2016-01-30 NOTE — Patient Instructions (Signed)
Your physician wants you to follow-up in: 1 year or sooner if needed. You will receive a reminder letter in the mail two months in advance. If you don't receive a letter, please call our office to schedule the follow-up appointment.  If you need a refill on your cardiac medications before your next appointment, please call your pharmacy.  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

## 2016-08-19 ENCOUNTER — Other Ambulatory Visit: Payer: Self-pay | Admitting: Cardiovascular Disease

## 2016-11-16 ENCOUNTER — Other Ambulatory Visit: Payer: Self-pay | Admitting: Cardiovascular Disease

## 2016-11-18 NOTE — Telephone Encounter (Signed)
Rx request sent to pharmacy.  

## 2016-12-17 ENCOUNTER — Telehealth: Payer: Self-pay | Admitting: Neurology

## 2016-12-17 NOTE — Telephone Encounter (Signed)
error 

## 2016-12-24 ENCOUNTER — Encounter: Payer: Self-pay | Admitting: Neurology

## 2016-12-24 ENCOUNTER — Ambulatory Visit (INDEPENDENT_AMBULATORY_CARE_PROVIDER_SITE_OTHER): Payer: Medicare Other | Admitting: Neurology

## 2016-12-24 VITALS — BP 146/91 | HR 56 | Ht 73.0 in | Wt 225.0 lb

## 2016-12-24 DIAGNOSIS — G458 Other transient cerebral ischemic attacks and related syndromes: Secondary | ICD-10-CM | POA: Diagnosis not present

## 2016-12-24 NOTE — Progress Notes (Signed)
Reason for visit: History of cerebrovascular disease  Edgar Jones is an 71 y.o. male  History of present illness:  Mr. Jou is a 71 year old right-handed white male with a history of prior TIA events. The patient has done well over the last year, he continues to work as a Naval architect. He requires recertification annually through his physicians to continue working. He returns to this office for further evaluation. He has not had any events of headache, vision changes, numbness or weakness of the arms, legs, or face. The patient denies any balance issues or falls. He has not had any problems controlling the bowels or the bladder. He denies any new medical issues that have come up since last seen.  Past Medical History:  Diagnosis Date  . Dyslipidemia   . Endocarditis   . GERD (gastroesophageal reflux disease)   . Heart murmur   . History of stress test 11/22/2010   Perfusion defect in the inferior myocardial region is consistent with diaphragmatic attenuation. The remaining myocardium demonstrates normal myocardial perfusion with no evidence of ischemia or infarct. The post stress left ventricle is normal in size. The post stress EF 65%, Gobal left ventricle systolic function is normal. No significant wall motion abnormalities noted. Normal Myocardial perfus  . Hx of echocardiogram 02/10/2013   The cavity size was normal. Ststolic function was normal. The estimated EF was in the range 55%-60%. Wall motion was normal; there was no regional wall motion abnormalities. Stable MV repair & annuloplasty ring. Trace MR  . Hypertension   . OSA on CPAP   . SBE (subacute bacterial endocarditis)   . Stroke [facility])    3/10 endocarditis/embolic    Past Surgical History:  Procedure Laterality Date  . CARDIAC CATHETERIZATION  01/2009  . COLONOSCOPY    . HERNIA REPAIR     right abd  . INGUINAL HERNIA REPAIR     left side  . MITRAL VALVE REPAIR  04/2009   32mm Edwards annuloplasty ring and  quadrangular resection pf P2 segment of the motral valve by Dr Dorris Fetch with normal coronary arteries.  Marland Kitchen POLYPECTOMY    . UPPER GASTROINTESTINAL ENDOSCOPY      Family History  Problem Relation Age of Onset  . Heart disease Mother   . Heart disease Father   . Arthritis Father   . Cirrhosis Brother   . Hypertension Maternal Aunt   . Hypertension Brother   . Hyperlipidemia Brother     Social history:  reports that he quit smoking about 35 years ago. His smoking use included Cigarettes. He has a 40.00 pack-year smoking history. He has never used smokeless tobacco. He reports that he drinks about 7.0 oz of alcohol per week . He reports that he does not use drugs.   No Known Allergies  Medications:  Prior to Admission medications   Medication Sig Start Date End Date Taking? Authorizing Provider  aspirin EC 81 MG tablet Take 81 mg by mouth daily.   Yes Historical Provider, MD  benazepril (LOTENSIN) 20 MG tablet Take 1 tablet by mouth daily. 11/22/14  Yes Historical Provider, MD  Omega-3 Fatty Acids (FISH OIL) 1000 MG CAPS Take 2 capsules by mouth daily.   Yes Historical Provider, MD  omeprazole (PRILOSEC) 40 MG capsule TAKE ONE CAPSULE BY MOUTH EVERY DAY 08/21/15  Yes Lennette Bihari, MD  simvastatin (ZOCOR) 40 MG tablet TAKE 1 TABLET BY MOUTH AT BEDTIME 11/18/16  Yes Lennette Bihari, MD    ROS:  Out of a complete 14 system review of symptoms, the patient complains only of the following symptoms, and all other reviewed systems are negative.  Review of systems is negative  Blood pressure (!) 146/91, pulse (!) 56, height 6\' 1"  (1.854 m), weight 225 lb (102.1 kg).  Physical Exam  General: The patient is alert and cooperative at the time of the examination.  Skin: No significant peripheral edema is noted.   Neurologic Exam  Mental status: The patient is alert and oriented x 3 at the time of the examination. The patient has apparent normal recent and remote memory, with an  apparently normal attention span and concentration ability.   Cranial nerves: Facial symmetry is present. Speech is normal, no aphasia or dysarthria is noted. Extraocular movements are full. Visual fields are full.  Motor: The patient has good strength in all 4 extremities.  Sensory examination: Soft touch sensation is symmetric on the face, arms, and legs.  Coordination: The patient has good finger-nose-finger and heel-to-shin bilaterally.  Gait and station: The patient has a normal gait. Tandem gait is minimally unsteady. Romberg is negative. No drift is seen.  Reflexes: Deep tendon reflexes are symmetric.   Assessment/Plan:  1. 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 There are no restrictions in this regard. He will follow-up through this office if needed.  Marlan Palau. Keith Jakerra Floyd MD 12/24/2016 12:37 PM  [facility] [address] [place], [place] [id number]  Phone [phone] Fax [phone]

## 2017-01-14 ENCOUNTER — Ambulatory Visit: Payer: Self-pay | Admitting: Neurology

## 2017-01-29 ENCOUNTER — Ambulatory Visit (INDEPENDENT_AMBULATORY_CARE_PROVIDER_SITE_OTHER): Payer: Medicare Other | Admitting: Cardiovascular Disease

## 2017-01-29 ENCOUNTER — Encounter: Payer: Self-pay | Admitting: Cardiovascular Disease

## 2017-01-29 VITALS — BP 150/94 | HR 63 | Ht 73.0 in | Wt 220.8 lb

## 2017-01-29 DIAGNOSIS — I350 Nonrheumatic aortic (valve) stenosis: Secondary | ICD-10-CM

## 2017-01-29 DIAGNOSIS — Z9989 Dependence on other enabling machines and devices: Secondary | ICD-10-CM | POA: Diagnosis not present

## 2017-01-29 DIAGNOSIS — Z9889 Other specified postprocedural states: Secondary | ICD-10-CM

## 2017-01-29 DIAGNOSIS — G4733 Obstructive sleep apnea (adult) (pediatric): Secondary | ICD-10-CM

## 2017-01-29 DIAGNOSIS — E785 Hyperlipidemia, unspecified: Secondary | ICD-10-CM

## 2017-01-29 DIAGNOSIS — I10 Essential (primary) hypertension: Secondary | ICD-10-CM | POA: Diagnosis not present

## 2017-01-29 DIAGNOSIS — Z79899 Other long term (current) drug therapy: Secondary | ICD-10-CM

## 2017-01-29 MED ORDER — ROSUVASTATIN CALCIUM 20 MG PO TABS: 20.0000 mg | ORAL_TABLET | Freq: Every day | ORAL | 6 refills | Status: DC

## 2017-01-29 MED ORDER — BENAZEPRIL HCL 40 MG PO TABS: 40.0000 mg | ORAL_TABLET | Freq: Every day | ORAL | 6 refills | Status: DC

## 2017-01-29 MED ORDER — EZETIMIBE 10 MG PO TABS: 10.0000 mg | ORAL_TABLET | Freq: Every day | ORAL | 6 refills | Status: DC

## 2017-01-29 NOTE — Progress Notes (Signed)
Patient ID: Edgar Jones, male   DOB: 02-20-1946, 71 y.o.   MRN: [id number]    Primary M.D.: Dr. Juanita Craver  HPI: Mr [name] is a 71 year old former a patient of Dr. Rollene Fare. He presents for a one year cardiology follow-up evaluation.  Mr. Maffeo underwent mitral valve repair or in June 2010 with a 32 mm Edwards annuplasty plasty ring and quadrangular resection of P2 segment of the mitral valve which was done by Dr. Roxan Hockey. 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 At that time he had normal coronary arteries. He has a history of obstructive sleep apnea since 2010 and has been on CPAP therapy and admits to 100% use. There is also a history of hypertension as well as hyperlipidemia in addition to GERD.  When I saw initially saw him in January 2015, he denied any episodes of chest pain. He noted mild episodes intermittently and feels at times she is unable to get a deep breath. An echo Doppler study in April 2014 confirmed normal systolic function with an ejection fraction of 55-60%. Left atrium was mildly dilated. His mitral valve repair and annuloplasty ring were stable there was no evidence for prolapse and only trivial regurgitation. He had mild TR.  Prior to initially seeing him in September 2015 he had experienced 2 transient events associated with transient right hand twitching and weakness as well as another episode of "total "vision.  He he denied any dizziness or he saw Dr. Floyde Parkins.  Carotid studies did not demonstrate any significant abnormality.  An MRI of his brain did not reveal any acute intracranial abnormality.  There was expected evolution of the left occipital lobe hemorrhage and small right parietal cortex infarct, which previously had been detected in 2010.  There was evidence for mild interval increase microhemorrhages in both cerebral hemispheres.  A repeat echo Doppler study was also done in June  2015, which showed an ejection fraction in the range of 60-65%.  There was probable to diastolic dysfunction.  There is trivial aortic regurgitation.  His mitral valve repair was intact with moderately thickened leaflets.  His atrium was dilated.  His right ventricle was mildly dilated.  It was felt by Dr. Jannifer Franklin that he possibly he may have had a TIA, but this was not definitive.  The possibility of a future loop recorder was discussed if the patient had recurrent symptomatology.  The patient denies any recurrent symptoms.  He denies any awareness of any palpitations.  He denies chest pain.  He denies fever or shortness of breath.  He has a history of hypertension and has been on but hasn't filled 20 mg.  He has hyperlipidemia on simvastatin 40 mg.  There is a history of GERD for which she is on Prilosec.  He also has obstructive sleep apnea and continues to use CPAP with 100% compliance.   Over the past 6 months, he has continued to remain stable.  He denies chest pain.  He doesn't mild shortness of breath occasionally, which is not typically exertionally precipitated.  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  He has a history of GERD but this has been controlled with omeprazole.  He has been taking simvastatin 40 mg daily for hyperlipidemia. He has been on been  omeprazole 20 mg daily for high blood pressure.  Several years ago his triglycerides were elevated  and omega-3 fatty acid was added to his lipid lowering therapy.    On 01/17/2016 he underwent a follow-up echo Doppler study. This showed an EF of 60-65% with mild LVH. There was very mild aortic stenosis with a mean gradient of 11 mm. Aortic root measured 38 mm. He had severe left atrial dilatation and moderate right atrial dilatation..  Pulmonary pressures were normal with a PA pressure at 27 mm.  His mitral valve repair was stable without significant stenosis.   He continues to drive a [name] and  needs certified driving license.  He has had laboratory by his primary in January 2018 in this revealed a total cholesterol 202, triglycerides 225, HDL 39, and LDL 136 with a non-HDL of 163.  His medicines were not adjusted and he is maintained on the stent 40 mg.  He continues to take but has a pill 20 g for hypertension and also takes omeprazole for GERD.  He has been taking over-the-counter fish oil and baby aspirin.  He presents for evaluation.   Past Medical History:  Diagnosis Date  . Dyslipidemia   . Endocarditis   . GERD (gastroesophageal reflux disease)   . Heart murmur   . History of stress test 11/22/2010   Perfusion defect in the inferior myocardial region is consistent with diaphragmatic attenuation. The remaining myocardium demonstrates normal myocardial perfusion with no evidence of ischemia or infarct. The post stress left ventricle is normal in size. The post stress EF 65%, Gobal left ventricle systolic function is normal. No significant wall motion abnormalities noted. Normal Myocardial perfus  . Hx of echocardiogram 02/10/2013   The cavity size was normal. Ststolic function was normal. The estimated EF was in the range 55%-60%. Wall motion was normal; there was no regional wall motion abnormalities. Stable MV repair & annuloplasty ring. Trace MR  . Hypertension   . OSA on CPAP   . SBE (subacute bacterial endocarditis)   . Stroke [facility])    3/10 endocarditis/embolic    Past Surgical History:  Procedure Laterality Date  . CARDIAC CATHETERIZATION  01/2009  . COLONOSCOPY    . HERNIA REPAIR     right abd  . INGUINAL HERNIA REPAIR     left side  . MITRAL VALVE REPAIR  04/2009   62m Edwards annuloplasty ring and quadrangular resection pf P2 segment of the motral valve by Dr HRoxan Hockeywith normal coronary arteries.  .Marland KitchenPOLYPECTOMY    . UPPER GASTROINTESTINAL ENDOSCOPY      No Known Allergies  Current Outpatient Prescriptions  Medication Sig Dispense Refill  . aspirin  EC 81 MG tablet Take 81 mg by mouth daily.    . Omega-3 Fatty Acids (FISH OIL) 1000 MG CAPS Take 2 capsules by mouth daily.    .Marland Kitchenomeprazole (PRILOSEC) 40 MG capsule TAKE ONE CAPSULE BY MOUTH EVERY DAY 30 capsule 6  . benazepril (LOTENSIN) 40 MG tablet Take 1 tablet (40 mg total) by mouth daily. 30 tablet 6  . ezetimibe (ZETIA) 10 MG tablet Take 1 tablet (10 mg total) by mouth daily. 30 tablet 6  . rosuvastatin (CRESTOR) 20 MG tablet Take 1 tablet (20 mg total) by mouth daily. 30 tablet 6   No current facility-administered medications for this visit.     Socially he is married has 3 children and 2 grandchildren. There is a remote tobacco history but he quit over 30 years ago. 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 He is able to exercise without restriction. He does play golf and rides a bike.  Family History  Problem Relation Age of Onset  . Heart disease Mother   . Heart disease Father   . Arthritis Father   . Cirrhosis Brother   . Hypertension Maternal Aunt   . Hypertension Brother   . Hyperlipidemia Brother     ROS General: Negative; No fevers, chills, or night sweats;  HEENT: Negative; No changes in vision or hearing, sinus congestion, difficulty swallowing Pulmonary: Negative; No cough, wheezing, shortness of breath, hemoptysis Cardiovascular: Negative; No chest pain, presyncope, syncope, palpitations GI: Negative; No nausea, vomiting, diarrhea, or abdominal pain GU: Negative; No dysuria, hematuria, or difficulty voiding Musculoskeletal: Negative; no myalgias, joint pain, or weakness Hematologic/Oncology: Negative; no easy bruising, bleeding Endocrine: Negative; no heat/cold intolerance; no diabetes Neuro: See history of present illness; no changes in balance, headaches Skin: Negative; No rashes or skin lesions Psychiatric: Negative; No behavioral problems, depression Sleep: Positive for sleep apnea on CPAP therapy;   He admits to 100% compliance; No snoring, daytime sleepiness, hypersomnolence, bruxism, restless legs, hypnogognic hallucinations, no cataplexy Other comprehensive 14 point system review is negative.   PE BP (!) 150/94   Pulse 63   Ht _0  (1.854 m)   Wt 220 lb 12.8 oz (100.2 kg)   BMI 29.13 kg/m    Repeat BP by me 160/88  Wt Readings from Last 3 Encounters:  01/29/17 220 lb 12.8 oz (100.2 kg)  12/24/16 225 lb (102.1 kg)  01/30/16 220 lb 5 oz (99.9 kg)   General: Alert, oriented, no distress.  Skin: normal turgor, no rashes HEENT: Normocephalic, atraumatic. Pupils round and reactive; sclera anicteric; extraocular muscles intact; Fundi without hemorrhages or exudates. No xanthelasmas Nose without nasal septal hypertrophy Mouth/Parynx benign; Mallinpatti scale 3 Neck: No JVD, no carotid bruits; normal carotid upstroke Lungs: clear to ausculatation and percussion; no wheezing or rales Chest wall: without tenderness to palpitation Heart: RRR, s1 s2 normal 2/6 systolic murmur at the left sternal border radiating to the apex. No S3 or S4 gallop. Abdomen: soft, nontender; no hepatosplenomehaly, BS+; abdominal aorta nontender and not dilated by palpation. Back: no CVA tenderness Pulses 2+ Extremities: no clubbinbg cyanosis or edema, Homan's sign negative  Neurologic: grossly nonfocal; Cranial nerves grossly wnl Psychologic: Normal mood and affect  ECG (independently read by me): Normal sinus rhythm at 63 bpm with sinus arrhythmia, first-degree AV block, nonspecific T changes.  March 2017 ECG (independently read by me):  Sinus bradycardia 50 bpm with first-degree block.  No ectopy. Mild T wave abnormality inferiorly.  September 2016 ECG (independently read by me): Sinus bradycardia 53 bpm.  First-degree AV block with a PR interval at 224 ms.  ECG (independently read by me): Normal sinus rhythm at 63 bpm.  Occasional PVC.  First-degree AV block with a PR interval at 212.  September  2015 ECG (independently read by me): Sinus bradycardia 51 beats per minute.  Borderline first-degree V. block with a PR interval of 208 ms.  QTc interval 411 milliseconds  November 23, 2013 ECG (independently read by me): Sinus bradycardia with very mild first-degree block with a PR interval of 218 ms. No significant ST changes.  LABS:  BMP Latest Ref Rng & Units 08/09/2015 02/06/2015 04/26/2014  Glucose 65 - 99 mg/dL 90 82 100(H)  BUN 7 - 25 mg/dL _1 Creatinine 0.70 - 1.25 mg/dL 1.01 1.04 1.01  Sodium 135 - 146 mmol/L 137  140 140  Potassium 3.5 - 5.3 mmol/L 5.3 4.7 4.2  Chloride 98 - 110 mmol/L 105 102 101  CO2 20 - 31 mmol/L _0 Calcium 8.6 - 10.3 mg/dL 9.4 9.0 9.6   Hepatic Function Latest Ref Rng & Units 08/09/2015 02/06/2015 04/26/2014  Total Protein 6.1 - 8.1 g/dL 6.9 6.8 7.6  Albumin 3.6 - 5.1 g/dL 4.4 4.3 4.4  AST 10 - 35 U/L _1 ALT 9 - 46 U/L _2 Alk Phosphatase 40 - 115 U/L 45 42 53  Total Bilirubin 0.2 - 1.2 mg/dL 0.5 0.6 0.4   CBC Latest Ref Rng & Units 08/09/2015 02/06/2015 04/26/2014  WBC 4.0 - 10.5 K/uL 4.4 4.7 6.8  Hemoglobin 13.0 - 17.0 g/dL 14.0 14.5 14.4  Hematocrit 39.0 - 52.0 % 43.1 44.4 43.6  Platelets 150 - 400 K/uL 319 305 265   Lab Results  Component Value Date   MCV 92.3 08/09/2015   MCV 92.7 02/06/2015   MCV 91.8 04/26/2014   Lab Results  Component Value Date   TSH 1.184 08/09/2015   Lab Results  Component Value Date   HGBA1C  05/08/2009    4.6 (NOTE) The ADA recommends the following therapeutic goal for glycemic control related to Hgb A1c measurement: Goal of therapy: <6.5 Hgb A1c  Reference: American Diabetes Association: Clinical Practice Recommendations 2010, Diabetes Care, 2010, 33: (Suppl  1).   BNP No results found for: PROBNP  Lipid Panel     Component Value Date/Time   CHOL 157 08/09/2015 0934   TRIG 109 08/09/2015 0934   HDL 36 (L) 08/09/2015 0934   CHOLHDL 4.4 08/09/2015 0934   VLDL 22 08/09/2015 0934    LDLCALC 99 08/09/2015 0934     RADIOLOGY: No results found.  IMPRESSION:  1. Essential hypertension   2. History of mitral valve repair   3. Hyperlipidemia LDL goal <70   4. Mild aortic stenosis   5. Medication management   6. Obstructive sleep apnea on CPAP    ASSESSMENT AND PLAN: Mr. Daune Colgate a 57 -year-old gentleman who has a remote history of SBE and mitral valve endocarditis for which he required mitral valve repair surgery with quadrangular resection of his P2 segment of the mitral valve at which time a 32 mm Edwards annuloplasty ring was inserted in 2010.  His last  echo Doppler study showed stable mitral valve repair without stenosis or regurgitation.  He had normal systolic function with mild LVH.  There was evidence for mild aortic stenosis with a mean gradient of 11.  There was evidence for biatrial enlargement.  ECG today remain stable and he is no longer bradycardic.,  However, was elevated at 150/94, and repeat by me was 160/88.  I reviewed with him the new hypertensive guidelines.  I have recommended further titration of benign pleural from 20 mg up to 40 mg.  I will also recommended more aggressive lipid-lowering and for this reason, we'll discontinue simvastatin and in its place start rosuvastatin 20 mg.  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  He continues to be asymptomatic from a cardiovascular standpoint and has stable hemodynamics.  I will recheck laboratory in 6 weeks with his medication adjustment.  He has obstructive sleep apnea and admits to 100% compliance.  He denies any breakthrough  snoring.  There are no episodes of daytime sleepiness.  His sleep is restorative.  I will see him in office in 2 months for reevaluation.    Time spent: 25 minutes  Troy Sine, MD, [facility] 01/31/2017 7:55 PM

## 2017-01-29 NOTE — Patient Instructions (Addendum)
Medication Instructions: 1.) the benazepril has been increased from 20 mg to 40 mg daily.   2.) STOP the simvastatin. This has been replaced with generic Crestor 20 mg.  Labwork: 6 weeks. Lab slips provided for you today.    Testing/Procedures:  Your physician has requested that you have an echocardiogram. Echocardiography is a painless test that uses sound waves to create images of your heart. It provides your doctor with information about the size and shape of your heart and how well your heart's chambers and valves are working. This procedure takes approximately one hour. There are no restrictions for this procedure.     Follow-Up: 2 months with Dr Tresa EndoKelly  Any Other Special Instructions Will Be Listed Below (If Applicable).

## 2017-01-31 DIAGNOSIS — Z79899 Other long term (current) drug therapy: Secondary | ICD-10-CM | POA: Insufficient documentation

## 2017-01-31 DIAGNOSIS — I350 Nonrheumatic aortic (valve) stenosis: Secondary | ICD-10-CM | POA: Insufficient documentation

## 2017-02-13 ENCOUNTER — Ambulatory Visit (HOSPITAL_COMMUNITY): Payer: Medicare Other | Attending: Cardiovascular Disease

## 2017-02-13 ENCOUNTER — Other Ambulatory Visit: Payer: Self-pay

## 2017-02-13 DIAGNOSIS — I350 Nonrheumatic aortic (valve) stenosis: Secondary | ICD-10-CM

## 2017-02-13 DIAGNOSIS — Z9889 Other specified postprocedural states: Secondary | ICD-10-CM

## 2017-02-13 DIAGNOSIS — I082 Rheumatic disorders of both aortic and tricuspid valves: Secondary | ICD-10-CM | POA: Diagnosis not present

## 2017-02-13 DIAGNOSIS — E785 Hyperlipidemia, unspecified: Secondary | ICD-10-CM | POA: Insufficient documentation

## 2017-02-13 DIAGNOSIS — Z8673 Personal history of transient ischemic attack (TIA), and cerebral infarction without residual deficits: Secondary | ICD-10-CM | POA: Diagnosis not present

## 2017-03-28 LAB — COMPREHENSIVE METABOLIC PANEL
ALT: 23 U/L (ref 9–46)
AST: 22 U/L (ref 10–35)
Albumin: 4.5 g/dL (ref 3.6–5.1)
Alkaline Phosphatase: 38 U/L — ABNORMAL LOW (ref 40–115)
BUN: 16 mg/dL (ref 7–25)
CHLORIDE: 107 mmol/L (ref 98–110)
CO2: 26 mmol/L (ref 20–31)
Calcium: 9.2 mg/dL (ref 8.6–10.3)
Creat: 1.1 mg/dL (ref 0.70–1.18)
GLUCOSE: 90 mg/dL (ref 65–99)
POTASSIUM: 5 mmol/L (ref 3.5–5.3)
Sodium: 140 mmol/L (ref 135–146)
Total Bilirubin: 0.7 mg/dL (ref 0.2–1.2)
Total Protein: 6.9 g/dL (ref 6.1–8.1)

## 2017-03-28 LAB — LIPID PANEL
CHOL/HDL RATIO: 3.5 ratio (ref <5.0)
CHOLESTEROL: 149 mg/dL (ref <200)
HDL: 42 mg/dL (ref >40)
LDL Cholesterol: 81 mg/dL (ref <100)
Triglycerides: 130 mg/dL (ref <150)
VLDL: 26 mg/dL (ref <30)

## 2017-04-04 ENCOUNTER — Ambulatory Visit (INDEPENDENT_AMBULATORY_CARE_PROVIDER_SITE_OTHER): Payer: Medicare Other | Admitting: Cardiovascular Disease

## 2017-04-04 ENCOUNTER — Encounter: Payer: Self-pay | Admitting: *Deleted

## 2017-04-04 ENCOUNTER — Other Ambulatory Visit: Payer: Self-pay | Admitting: *Deleted

## 2017-04-04 ENCOUNTER — Encounter: Payer: Self-pay | Admitting: Cardiovascular Disease

## 2017-04-04 VITALS — BP 118/70 | HR 60 | Ht 73.0 in | Wt 214.0 lb

## 2017-04-04 DIAGNOSIS — G4733 Obstructive sleep apnea (adult) (pediatric): Secondary | ICD-10-CM

## 2017-04-04 DIAGNOSIS — Z9889 Other specified postprocedural states: Secondary | ICD-10-CM

## 2017-04-04 DIAGNOSIS — I10 Essential (primary) hypertension: Secondary | ICD-10-CM

## 2017-04-04 DIAGNOSIS — K219 Gastro-esophageal reflux disease without esophagitis: Secondary | ICD-10-CM

## 2017-04-04 DIAGNOSIS — E785 Hyperlipidemia, unspecified: Secondary | ICD-10-CM

## 2017-04-04 NOTE — Progress Notes (Signed)
Patient ID: Edgar Jones, male   DOB: 1946-05-21, 71 y.o.   MRN: [id number]    Primary M.D.: Dr. Juanita Craver  HPI: Edgar Jones is a 71 year old former a patient of Dr. Rollene Fare. He presents for a one year cardiology follow-up evaluation.  Edgar. [name] underwent mitral valve repair or in June 2010 with a 32 mm Edwards annuplasty plasty ring and quadrangular resection of P2 segment of the mitral valve which was done by Dr. Roxan Hockey. 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 At that time he had normal coronary arteries. He has a history of obstructive sleep apnea since 2010 and has been on CPAP therapy and admits to 100% use. There is also a history of hypertension as well as hyperlipidemia in addition to GERD.  When I saw initially saw him in January 2015, he denied any episodes of chest pain. He noted mild episodes intermittently and feels at times she is unable to get a deep breath. An echo Doppler study in April 2014 confirmed normal systolic function with an ejection fraction of 55-60%. Left atrium was mildly dilated. His mitral valve repair and annuloplasty ring were stable there was no evidence for prolapse and only trivial regurgitation. He had mild TR.  Prior to initially seeing him in September 2015 he had experienced 2 transient events associated with transient right hand twitching and weakness as well as another episode of "total "vision.  He he denied any dizziness or he saw Dr. Floyde Parkins.  Carotid studies did not demonstrate any significant abnormality.  An MRI of his brain did not reveal any acute intracranial abnormality.  There was expected evolution of the left occipital lobe hemorrhage and small right parietal cortex infarct, which previously had been detected in 2010.  There was evidence for mild interval increase microhemorrhages in both cerebral hemispheres.  A repeat echo Doppler study was also done in June  2015, which showed an ejection fraction in the range of 60-65%.  There was probable to diastolic dysfunction.  There is trivial aortic regurgitation.  His mitral valve repair was intact with moderately thickened leaflets.  His atrium was dilated.  His right ventricle was mildly dilated.  It was felt by Dr. Jannifer Franklin that he possibly he may have had a TIA, but this was not definitive.  The possibility of a future loop recorder was discussed if the patient had recurrent symptomatology.  The patient denies any recurrent symptoms.  He denies any awareness of any palpitations.  He denies chest pain.  He denies fever or shortness of breath.  He has a history of hypertension and has been on but hasn't filled 20 mg.  He has hyperlipidemia on simvastatin 40 mg.  There is a history of GERD for which she is on Prilosec.  He also has obstructive sleep apnea and continues to use CPAP with 100% compliance.   Over the past 6 months, he has continued to remain stable.  He denies chest pain.  He doesn't mild shortness of breath occasionally, which is not typically exertionally precipitated.  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  He has a history of GERD but this has been controlled with omeprazole.  He has been taking simvastatin 40 mg daily for hyperlipidemia. He has been on been  omeprazole 20 mg daily for high blood pressure.  Several years ago his triglycerides were elevated  and omega-3 fatty acid was added to his lipid lowering therapy.    On 01/17/2016 he underwent a follow-up echo Doppler study. This showed an EF of 60-65% with mild LVH. There was very mild aortic stenosis with a mean gradient of 11 mm. Aortic root measured 38 mm. He had severe left atrial dilatation and moderate right atrial dilatation..  Pulmonary pressures were normal with a PA pressure at 27 mm.  His mitral valve repair was stable without significant stenosis.   He continues to drive a [name] and  needs certified driving license. Laboratory by his primary in January 2018: total cholesterol 202, triglycerides 225, HDL 39, and LDL 136 with a non-HDL of 163.  When I last saw him, I discontinued simvastatin, and in its place started rosuvastatin 20 mg.  He underwent a one-year follow-up echo Doppler study on 02/13/2017.  He has normal systolic function without regional wall motion abnormalities.  There was trivial aortic insufficiency.  His mitral annular ring prosthesis was present and function normally.  There was no Edgar.  He had severe LA dilatation and mild right a dilatation.  One week ago, follow-up laboratory showed improvement in lipids, such that total cholesterol is now 149, triglycerides 130, HDL 42, and LDL 81.  Dates.  He still has been having red meat 2-3 times per week, which is usually a clean cut.  He also has fish and chicken.  LFTs were normal.  He denies chest pain.  He denies palpitations.  He presents for evaluation.  Past Medical History:  Diagnosis Date  . Dyslipidemia   . Endocarditis   . GERD (gastroesophageal reflux disease)   . Heart murmur   . History of stress test 11/22/2010   Perfusion defect in the inferior myocardial region is consistent with diaphragmatic attenuation. The remaining myocardium demonstrates normal myocardial perfusion with no evidence of ischemia or infarct. The post stress left ventricle is normal in size. The post stress EF 65%, Gobal left ventricle systolic function is normal. No significant wall motion abnormalities noted. Normal Myocardial perfus  . Hx of echocardiogram 02/10/2013   The cavity size was normal. Ststolic function was normal. The estimated EF was in the range 55%-60%. Wall motion was normal; there was no regional wall motion abnormalities. Stable MV repair & annuloplasty ring. Trace Edgar  . Hypertension   . OSA on CPAP   . SBE (subacute bacterial endocarditis)   . Stroke [facility])    3/10 endocarditis/embolic    Past Surgical  History:  Procedure Laterality Date  . CARDIAC CATHETERIZATION  01/2009  . COLONOSCOPY    . HERNIA REPAIR     right abd  . INGUINAL HERNIA REPAIR     left side  . MITRAL VALVE REPAIR  04/2009   35m Edwards annuloplasty ring and quadrangular resection pf P2 segment of the motral valve by Dr HRoxan Hockeywith normal coronary arteries.  .Marland KitchenPOLYPECTOMY    . UPPER GASTROINTESTINAL ENDOSCOPY      No Known Allergies  Current Outpatient Prescriptions  Medication Sig Dispense Refill  . aspirin EC 81 MG tablet Take 81 mg by mouth daily.    . benazepril (LOTENSIN) 40 MG tablet Take 1 tablet (40 mg total) by mouth daily. 30 tablet 6  . Omega-3 Fatty Acids (FISH OIL) 1000 MG CAPS Take 2 capsules by mouth daily.    .Marland Kitchenomeprazole (PRILOSEC) 40 MG capsule TAKE ONE CAPSULE BY MOUTH EVERY DAY 30 capsule 6  . rosuvastatin (CRESTOR) 20 MG tablet Take 1  tablet (20 mg total) by mouth daily. 30 tablet 6   No current facility-administered medications for this visit.     Socially he is married has 3 children and 2 grandchildren. There is a remote tobacco history but he quit over 30 years ago. 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 He is able to exercise without restriction. He does play golf and rides a bike.  Family History  Problem Relation Age of Onset  . Heart disease Mother   . Heart disease Father   . Arthritis Father   . Cirrhosis Brother   . Hypertension Maternal Aunt   . Hypertension Brother   . Hyperlipidemia Brother     ROS General: Negative; No fevers, chills, or night sweats;  HEENT: Negative; No changes in vision or hearing, sinus congestion, difficulty swallowing Pulmonary: Negative; No cough, wheezing, shortness of breath, hemoptysis Cardiovascular: Negative; No chest pain, presyncope, syncope, palpitations GI: Negative; No nausea, vomiting, diarrhea, or abdominal pain GU: Negative; No dysuria, hematuria, or difficulty  voiding Musculoskeletal: Negative; no myalgias, joint pain, or weakness Hematologic/Oncology: Negative; no easy bruising, bleeding Endocrine: Negative; no heat/cold intolerance; no diabetes Neuro: See history of present illness; no changes in balance, headaches Skin: Negative; No rashes or skin lesions Psychiatric: Negative; No behavioral problems, depression Sleep: Positive for sleep apnea on CPAP therapy;  He admits to 100% compliance; No snoring, daytime sleepiness, hypersomnolence, bruxism, restless legs, hypnogognic hallucinations, no cataplexy Other comprehensive 14 point system review is negative.   PE BP 118/70 (BP Location: Right Arm, Patient Position: Sitting, Cuff Size: Normal)   Pulse 60   Ht _0  (1.854 m)   Wt 214 lb (97.1 kg)   BMI 28.23 kg/m    Repeat BP by me 120/72  Wt Readings from Last 3 Encounters:  04/04/17 214 lb (97.1 kg)  01/29/17 220 lb 12.8 oz (100.2 kg)  12/24/16 225 lb (102.1 kg)   General: Alert, oriented, no distress.  Skin: normal turgor, no rashes, warm and dry HEENT: Normocephalic, atraumatic. Pupils equal round and reactive to light; sclera anicteric; extraocular muscles intact;  Nose without nasal septal hypertrophy Mouth/Parynx benign; Mallinpatti scale 3 Neck: No JVD, no carotid bruits; normal carotid upstroke Lungs: clear to ausculatation and percussion; no wheezing or rales Chest wall: without tenderness to palpitation Heart: PMI not displaced, RRR, s1 s2 normal, 1/6 systolic murmur at the left sternal border.  I did not appreciate any diastolic murmur.  No S3, S4 gallop.  No rubs, thrills, or headaches.   Abdomen: soft, nontender; no hepatosplenomehaly, BS+; abdominal aorta nontender and not dilated by palpation. Back: no CVA tenderness Pulses 2+ Musculoskeletal: full range of motion, normal strength, no joint deformities Extremities: no clubbing cyanosis or edema, Homan's sign negative  Neurologic: grossly nonfocal; Cranial nerves  grossly wnl Psychologic: Normal mood and affect'normal cognition  ECG (independently read by me): Normal sinus rhythm at 60 bpm.  First degree AV block.  PAC.  PR interval 214 ms per QTc interval 394 ms.  March 2018 ECG (independently read by me): Normal sinus rhythm at 63 bpm with sinus arrhythmia, first-degree AV block, nonspecific T changes.  March 2017 ECG (independently read by me):  Sinus bradycardia 50 bpm with first-degree block.  No ectopy. Mild T wave abnormality inferiorly.  September 2016 ECG (independently read by me): Sinus bradycardia 53 bpm.  First-degree AV block with a PR interval at 224 ms.  ECG (independently read  by me): Normal sinus rhythm at 63 bpm.  Occasional PVC.  First-degree AV block with a PR interval at 212.  September 2015 ECG (independently read by me): Sinus bradycardia 51 beats per minute.  Borderline first-degree V. block with a PR interval of 208 ms.  QTc interval 411 milliseconds  November 23, 2013 ECG (independently read by me): Sinus bradycardia with very mild first-degree block with a PR interval of 218 ms. No significant ST changes.  LABS:  BMP Latest Ref Rng & Units 03/28/2017 08/09/2015 02/06/2015  Glucose 65 - 99 mg/dL 90 90 82  BUN 7 - 25 mg/dL _0 Creatinine 0.70 - 1.18 mg/dL 1.10 1.01 1.04  Sodium 135 - 146 mmol/L 140 137 140  Potassium 3.5 - 5.3 mmol/L 5.0 5.3 4.7  Chloride 98 - 110 mmol/L 107 105 102  CO2 20 - 31 mmol/L _1 Calcium 8.6 - 10.3 mg/dL 9.2 9.4 9.0   Hepatic Function Latest Ref Rng & Units 03/28/2017 08/09/2015 02/06/2015  Total Protein 6.1 - 8.1 g/dL 6.9 6.9 6.8  Albumin 3.6 - 5.1 g/dL 4.5 4.4 4.3  AST 10 - 35 U/L _2 ALT 9 - 46 U/L _3 Alk Phosphatase 40 - 115 U/L 38(L) 45 42  Total Bilirubin 0.2 - 1.2 mg/dL 0.7 0.5 0.6   CBC Latest Ref Rng & Units 08/09/2015 02/06/2015 04/26/2014  WBC 4.0 - 10.5 K/uL 4.4 4.7 6.8  Hemoglobin 13.0 - 17.0 g/dL 14.0 14.5 14.4  Hematocrit 39.0 - 52.0 % 43.1 44.4 43.6    Platelets 150 - 400 K/uL 319 305 265   Lab Results  Component Value Date   MCV 92.3 08/09/2015   MCV 92.7 02/06/2015   MCV 91.8 04/26/2014   Lab Results  Component Value Date   TSH 1.184 08/09/2015   Lab Results  Component Value Date   HGBA1C  05/08/2009    4.6 (NOTE) The ADA recommends the following therapeutic goal for glycemic control related to Hgb A1c measurement: Goal of therapy: <6.5 Hgb A1c  Reference: American Diabetes Association: Clinical Practice Recommendations 2010, Diabetes Care, 2010, 33: (Suppl  1).   BNP No results found for: PROBNP  Lipid Panel     Component Value Date/Time   CHOL 149 03/28/2017 0925   TRIG 130 03/28/2017 0925   HDL 42 03/28/2017 0925   CHOLHDL 3.5 03/28/2017 0925   VLDL 26 03/28/2017 0925   LDLCALC 81 03/28/2017 0925     RADIOLOGY: No results found.  IMPRESSION:  1. Essential hypertension   2. Hyperlipidemia LDL goal <70   3. S/P mitral valve repair   4. Gastroesophageal reflux disease without esophagitis   5. OSA (obstructive sleep apnea)    ASSESSMENT AND PLAN: Edgar Jones a 37 -year-old gentleman who has a remote history of SBE and mitral valve endocarditis for which he required mitral valve repair surgery with quadrangular resection of his P2 segment of the mitral valve at which time a 32 mm Edwards annuloplasty ring was inserted in 2010.  His  echo Doppler study in March 2017 showed stable mitral valve repair without stenosis or regurgitation.  He had normal systolic function with mild LVH.  There was evidence for mild aortic stenosis with a mean gradient of 11.  There was evidence for biatrial enlargement.  When I last saw him several months ago, he was hypertensive and I further titrated benazapril from 20-40 mg.  His blood pressure today is significantly  improved and now normal.  He also has a history of hyperlipidemia and I discontinued his simvastatin and started him on rosuvastatin.  Lipid studies are improved with  total cholesterol 149, and LDL cholesterol 81.  We discussed additional modification in his diet.  He had normal coronary arteries.  At the time of his mitral valve repair.  I reviewed his echo Doppler study with him in detail.  This continues to show a competent annuloplasty ring.  He has normal systolic function without regional wall motion abnormalities.  Atrium are dilated.  His weight is stable with a BMI of 28.23.  He takes omeprazole for GERD which is stable.  In 6 months, I have recommended repeat chemistry and lipid panel.  Target LDL is less than 70.  He has obstructive sleep apnea and continues to be on CPAP therapy with 100% compliance.  He is stable with reference to obtaining and continuing with his certified driving license and we will write a letter in support of this today.  I will see him back in follow-up evaluation following that assessment.    Time spent: 25 minutes  Troy Sine, MD, [facility] 04/04/2017 10:10 AM

## 2017-04-04 NOTE — Patient Instructions (Signed)
Your physician wants you to follow-up in: 6 months or sooner if needed. You will receive a reminder letter in the mail two months in advance. If you don't receive a letter, please call our office to schedule the follow-up appointment.  Your physician recommends that you return for lab work in: 6 months. Lab orders will be mailed to you when it's time to have them drawn.  If you need a refill on your cardiac medications before your next appointment, please call your pharmacy.

## 2017-04-15 ENCOUNTER — Telehealth: Payer: Self-pay | Admitting: *Deleted

## 2017-04-15 NOTE — Telephone Encounter (Signed)
Patient called and notified of lab results. 

## 2017-04-15 NOTE — Telephone Encounter (Signed)
-----   Message from Lennette Biharihomas A Kelly, MD sent at 04/14/2017  8:49 AM EDT ----- Labs good

## 2017-04-25 ENCOUNTER — Ambulatory Visit: Payer: Medicare Other | Admitting: Neurology

## 2017-05-02 ENCOUNTER — Ambulatory Visit: Payer: Medicare Other | Admitting: Neurology

## 2017-08-15 ENCOUNTER — Other Ambulatory Visit: Payer: Self-pay | Admitting: *Deleted

## 2017-08-15 DIAGNOSIS — I10 Essential (primary) hypertension: Secondary | ICD-10-CM

## 2017-08-15 DIAGNOSIS — E785 Hyperlipidemia, unspecified: Secondary | ICD-10-CM

## 2017-08-18 ENCOUNTER — Other Ambulatory Visit: Payer: Self-pay | Admitting: Cardiovascular Disease

## 2017-11-18 ENCOUNTER — Telehealth: Payer: Self-pay | Admitting: Cardiovascular Disease

## 2017-11-18 ENCOUNTER — Other Ambulatory Visit: Payer: Self-pay | Admitting: Cardiovascular Disease

## 2017-11-18 NOTE — Telephone Encounter (Signed)
Patient aware lab orders are active.

## 2017-11-18 NOTE — Telephone Encounter (Signed)
Returned call, patient states that he has been taking rosuvastatin and was wondering he can switch to atorvastatin as the tier has changed at the beginning of the year.   States he wants to switch to atorvastatin to save money.   Advised I would route to Dr. Tresa EndoKelly to review.

## 2017-11-18 NOTE — Telephone Encounter (Signed)
Pt c/o medication issue:  1. Name of Medication: Rosuv  2. How are you currently taking this medication (dosage and times per day)? Rosuvastatin 20 mg  3. Are you having a reaction (difficulty breathing--STAT)? no  4. What is your medication issue? Patient would like to switch to Atorvastatin instead

## 2017-11-18 NOTE — Telephone Encounter (Signed)
Patient calling for orders for lab work to be completed before appt on 03-12-18

## 2017-11-19 NOTE — Telephone Encounter (Signed)
If he is taking rosuvastatin 20 mg, can change to atorvastatin 40 mg daily

## 2017-11-21 MED ORDER — ATORVASTATIN CALCIUM 40 MG PO TABS: 40.0000 mg | ORAL_TABLET | Freq: Every day | ORAL | 3 refills | Status: DC

## 2017-11-21 NOTE — Telephone Encounter (Signed)
Spoke with patient; informed him that Dr Tresa Endokelly agreeable with change will send rx for Atorvastatin 40 mg qd to pharmacy. Patient voiced understanding.

## 2017-11-21 NOTE — Telephone Encounter (Signed)
Please call asap,pt wants to know what Dr Tresa EndoKelly decided about changing his medicine.He needs it for the weekend please.

## 2017-11-25 ENCOUNTER — Ambulatory Visit: Payer: Medicare Other | Admitting: Cardiovascular Disease

## 2018-03-04 ENCOUNTER — Ambulatory Visit (INDEPENDENT_AMBULATORY_CARE_PROVIDER_SITE_OTHER): Payer: Medicare Other | Admitting: Neurology

## 2018-03-04 ENCOUNTER — Encounter: Payer: Self-pay | Admitting: Neurology

## 2018-03-04 VITALS — BP 146/76 | HR 52 | Ht 73.0 in | Wt 221.0 lb

## 2018-03-04 DIAGNOSIS — G459 Transient cerebral ischemic attack, unspecified: Secondary | ICD-10-CM | POA: Diagnosis not present

## 2018-03-04 NOTE — Progress Notes (Signed)
Reason for visit: TIA  Edgar Jones is an 72 y.o. male  History of present illness:  Edgar Jones is a 72 year old right-handed white male with a history of a prior TIA event.  The patient comes in for routine reevaluation.  Over the last year he has not had any further events.  He feels well, there have been no new episodes of numbness, weakness, speech changes, visual changes, or troubles with balance.  Overall his energy level is good.  He is sleeping well.  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  Past Medical History:  Diagnosis Date  . Dyslipidemia   . Endocarditis   . GERD (gastroesophageal reflux disease)   . Heart murmur   . History of stress test 11/22/2010   Perfusion defect in the inferior myocardial region is consistent with diaphragmatic attenuation. The remaining myocardium demonstrates normal myocardial perfusion with no evidence of ischemia or infarct. The post stress left ventricle is normal in size. The post stress EF 65%, Gobal left ventricle systolic function is normal. No significant wall motion abnormalities noted. Normal Myocardial perfus  . Hx of echocardiogram 02/10/2013   The cavity size was normal. Ststolic function was normal. The estimated EF was in the range 55%-60%. Wall motion was normal; there was no regional wall motion abnormalities. Stable MV repair & annuloplasty ring. Trace MR  . Hypertension   . OSA on CPAP   . SBE (subacute bacterial endocarditis)   . Stroke [facility])    3/10 endocarditis/embolic    Past Surgical History:  Procedure Laterality Date  . CARDIAC CATHETERIZATION  01/2009  . COLONOSCOPY    . HERNIA REPAIR     right abd  . INGUINAL HERNIA REPAIR     left side  . MITRAL VALVE REPAIR  04/2009   32mm Edwards annuloplasty ring and quadrangular resection pf P2 segment of the motral valve by Dr Dorris FetchHendrickson with normal coronary arteries.  Marland Kitchen. POLYPECTOMY    . UPPER GASTROINTESTINAL  ENDOSCOPY      Family History  Problem Relation Age of Onset  . Heart disease Mother   . Heart disease Father   . Arthritis Father   . Cirrhosis Brother   . Hypertension Maternal Aunt   . Hypertension Brother   . Hyperlipidemia Brother     Social history:  reports that he quit smoking about 36 years ago. His smoking use included cigarettes. He has a 40.00 pack-year smoking history. He has never used smokeless tobacco. He reports that he drinks about 7.0 oz of alcohol per week. He reports that he does not use drugs.   No Known Allergies  Medications:  Prior to Admission medications   Medication Sig Start Date End Date Taking? Authorizing Provider  aspirin EC 81 MG tablet Take 81 mg by mouth daily.   Yes [provider]  benazepril (LOTENSIN) 40 MG tablet TAKE 1 TABLET (40 MG TOTAL) BY MOUTH DAILY. 08/18/17  Yes Lennette BihariKelly, Thomas A, MD  Omega-3 Fatty Acids (FISH OIL) 1000 MG CAPS Take 2 capsules by mouth daily.   Yes [provider]  omeprazole (PRILOSEC) 40 MG capsule TAKE ONE CAPSULE BY MOUTH EVERY DAY 08/21/15  Yes Lennette BihariKelly, Thomas A, MD  atorvastatin (LIPITOR) 40 MG tablet Take 1 tablet (40 mg total) by mouth daily. 11/21/17 02/19/18  Lennette BihariKelly, Thomas A, MD    ROS:  Out of a complete 14 system review of symptoms, the patient complains  only of the following symptoms, and all other reviewed systems are negative.  History of TIA  Blood pressure (!) 146/76, pulse (!) 52, height 6\' 1"  (1.854 m), weight 221 lb (100.2 kg).  Physical Exam  General: The patient is alert and cooperative at the time of the examination.  Skin: No significant peripheral edema is noted.   Neurologic Exam  Mental status: The patient is alert and oriented x 3 at the time of the examination. The patient has apparent normal recent and remote memory, with an apparently normal attention span and concentration ability.   Cranial nerves: Facial symmetry is present. Speech is normal, no aphasia or  dysarthria is noted. Extraocular movements are full. Visual fields are full.  Motor: The patient has good strength in all 4 extremities.  Sensory examination: Soft touch sensation is symmetric on the face, arms, and legs.  Coordination: The patient has good finger-nose-finger and heel-to-shin bilaterally.  Gait and station: The patient has a normal gait. Tandem gait is normal. Romberg is negative. No drift is seen.  Reflexes: Deep tendon reflexes are symmetric.   Assessment/Plan:  1.  History of TIA  The patient is doing well at this time, I have no restrictions with his job activities.  I have written a letter in this regard.  He will follow-up in 1 year.  Marlan Palau MD 03/04/2018 7:30 AM  [facility] [address] [place], [place] [address]  Phone [phone] Fax [phone]

## 2018-03-12 ENCOUNTER — Encounter: Payer: Self-pay | Admitting: Cardiovascular Disease

## 2018-03-12 ENCOUNTER — Ambulatory Visit (INDEPENDENT_AMBULATORY_CARE_PROVIDER_SITE_OTHER): Payer: Medicare Other | Admitting: Cardiovascular Disease

## 2018-03-12 VITALS — BP 120/80 | HR 48 | Ht 74.0 in | Wt 218.6 lb

## 2018-03-12 DIAGNOSIS — I10 Essential (primary) hypertension: Secondary | ICD-10-CM

## 2018-03-12 DIAGNOSIS — G4733 Obstructive sleep apnea (adult) (pediatric): Secondary | ICD-10-CM | POA: Diagnosis not present

## 2018-03-12 DIAGNOSIS — E785 Hyperlipidemia, unspecified: Secondary | ICD-10-CM | POA: Diagnosis not present

## 2018-03-12 DIAGNOSIS — Z9889 Other specified postprocedural states: Secondary | ICD-10-CM

## 2018-03-12 LAB — LIPID PANEL
CHOL/HDL RATIO: 3.8 ratio (ref 0.0–5.0)
Cholesterol, Total: 139 mg/dL (ref 100–199)
HDL: 37 mg/dL — AB (ref >39)
LDL CALC: 83 mg/dL (ref 0–99)
TRIGLYCERIDES: 93 mg/dL (ref 0–149)
VLDL CHOLESTEROL CAL: 19 mg/dL (ref 5–40)

## 2018-03-12 LAB — COMPREHENSIVE METABOLIC PANEL
A/G RATIO: 1.8 (ref 1.2–2.2)
ALK PHOS: 44 IU/L (ref 39–117)
ALT: 24 IU/L (ref 0–44)
AST: 17 IU/L (ref 0–40)
Albumin: 4.4 g/dL (ref 3.5–4.8)
BILIRUBIN TOTAL: 0.5 mg/dL (ref 0.0–1.2)
BUN/Creatinine Ratio: 16 (ref 10–24)
BUN: 18 mg/dL (ref 8–27)
CO2: 24 mmol/L (ref 20–29)
Calcium: 9.8 mg/dL (ref 8.6–10.2)
Chloride: 104 mmol/L (ref 96–106)
Creatinine, Ser: 1.1 mg/dL (ref 0.76–1.27)
GFR calc non Af Amer: 67 mL/min/{1.73_m2} (ref >59)
GFR, EST AFRICAN AMERICAN: 77 mL/min/{1.73_m2} (ref >59)
GLUCOSE: 89 mg/dL (ref 65–99)
Globulin, Total: 2.4 g/dL (ref 1.5–4.5)
POTASSIUM: 5 mmol/L (ref 3.5–5.2)
Sodium: 142 mmol/L (ref 134–144)
Total Protein: 6.8 g/dL (ref 6.0–8.5)

## 2018-03-12 NOTE — Progress Notes (Signed)
Patient ID: Edgar Jones, male   DOB: 08/18/1946, 72 y.o.   MRN: [id number]    Primary M.D.: Dr. Juanita Craver  HPI: Edgar Jones is a 72 year old former a patient of Dr. Rollene Fare. He presents for a one year cardiology follow-up evaluation.  Edgar. Edgar Jones underwent mitral valve repair or in June 2010 with a 32 mm Edwards annuplasty plasty ring and quadrangular resection of P2 segment of the mitral valve which was done by Dr. Roxan Hockey. 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 At that time he had normal coronary arteries. He has a history of obstructive sleep apnea since 2010 and has been on CPAP therapy and admits to 100% use. There is also a history of hypertension as well as hyperlipidemia in addition to GERD.  When I saw initially saw him in January 2015, he denied any episodes of chest pain. He noted mild episodes intermittently and feels at times she is unable to get a deep breath. An echo Doppler study in April 2014 confirmed normal systolic function with an ejection fraction of 55-60%. Left atrium was mildly dilated. His mitral valve repair and annuloplasty ring were stable there was no evidence for prolapse and only trivial regurgitation. He had mild TR.  Prior to initially seeing him in September 2015 he had experienced 2 transient events associated with transient right hand twitching and weakness as well as another episode of "total "vision.  He he denied any dizziness or he saw Dr. Floyde Parkins.  Carotid studies did not demonstrate any significant abnormality.  An MRI of his brain did not reveal any acute intracranial abnormality.  There was expected evolution of the left occipital lobe hemorrhage and small right parietal cortex infarct, which previously had been detected in 2010.  There was evidence for mild interval increase microhemorrhages in both cerebral hemispheres.  A repeat echo Doppler study was also done in June  2015, which showed an ejection fraction in the range of 60-65%.  There was probable to diastolic dysfunction.  There is trivial aortic regurgitation.  His mitral valve repair was intact with moderately thickened leaflets.  His atrium was dilated.  His right ventricle was mildly dilated.  It was felt by Dr. Jannifer Franklin that he possibly he may have had a TIA, but this was not definitive.  The possibility of a future loop recorder was discussed if the patient had recurrent symptomatology.  The patient denies any recurrent symptoms.  He denies any awareness of any palpitations.  He denies chest pain.  He denies fever or shortness of breath.  He has a history of hypertension and has been on but hasn't filled 20 mg.  He has hyperlipidemia on simvastatin 40 mg.  There is a history of GERD for which she is on Prilosec.  He also has obstructive sleep apnea and continues to use CPAP with 100% compliance.   Over the past 6 months, he has continued to remain stable.  He denies chest pain.  He doesn't mild shortness of breath occasionally, which is not typically exertionally precipitated.  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  He has a history of GERD but this has been controlled with omeprazole.  He has been taking simvastatin 40 mg daily for hyperlipidemia. He has been on been  omeprazole 20 mg daily for high blood pressure.  Several years ago his triglycerides were elevated  and omega-3 fatty acid was added to his lipid lowering therapy.    On 01/17/2016 he underwent a follow-up echo Doppler study. This showed an EF of 60-65% with mild LVH. There was very mild aortic stenosis with a mean gradient of 11 mm. Aortic root measured 38 mm. He had severe left atrial dilatation and moderate right atrial dilatation..  Pulmonary pressures were normal with a PA pressure at 27 mm.  His mitral valve repair was stable without significant stenosis.   He continues to drive a [name] and  needs certified driving license. Laboratory by his primary in January 2018: total cholesterol 202, triglycerides 225, HDL 39, and LDL 136 with a non-HDL of 163.  When I last saw him, I discontinued simvastatin, and in its place started rosuvastatin 20 mg.  He underwent a one-year follow-up echo Doppler study on 02/13/2017.  He has normal systolic function without regional wall motion abnormalities.  There was trivial aortic insufficiency.  His mitral annular ring prosthesis was present and function normally.  There was no Edgar.  He had severe LA dilatation and mild right a dilatation.  Subsequent  laboratory in May 2018 showed improvement in lipids, such that total cholesterol is now 149, triglycerides 130, HDL 42, and LDL 81.  Dates.  He still has been having red meat 2-3 times per week, which is usually a clean cut.  He also has fish and chicken.  LFTs were normal.    Over the past year, he has continued to be stable.  He specifically denies chest pain or shortness of breath.  He denies any significant palpitation.  He continues to use CPAP with 100% compliance and is followed at [facility].  He continues to drive a truck.  He's asymptomatic with reference to dizziness, presyncope or syncope.  He had undergone a follow-up neurologic evaluation in April 2019 and was stable.  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  He presents to the office today for follow-up evaluation and to obtain cardiology clearance.  Past Medical History:  Diagnosis Date  . Dyslipidemia   . Endocarditis   . GERD (gastroesophageal reflux disease)   . Heart murmur   . History of stress test 11/22/2010   Perfusion defect in the inferior myocardial region is consistent with diaphragmatic attenuation. The remaining myocardium demonstrates normal myocardial perfusion with no evidence of ischemia or infarct. The post stress left ventricle is normal in size. The post stress EF 65%, Gobal  left ventricle systolic function is normal. No significant wall motion abnormalities noted. Normal Myocardial perfus  . Hx of echocardiogram 02/10/2013   The cavity size was normal. Ststolic function was normal. The estimated EF was in the range 55%-60%. Wall motion was normal; there was no regional wall motion abnormalities. Stable MV repair & annuloplasty ring. Trace Edgar  . Hypertension   . OSA on CPAP   . SBE (subacute bacterial endocarditis)   . Stroke [facility])    3/10 endocarditis/embolic    Past Surgical History:  Procedure Laterality Date  . CARDIAC CATHETERIZATION  01/2009  . COLONOSCOPY    . HERNIA REPAIR     right abd  . INGUINAL HERNIA REPAIR     left side  . MITRAL VALVE REPAIR  04/2009   44m Edwards annuloplasty ring and quadrangular resection pf P2 segment of the motral valve by Dr HRoxan Hockeywith normal coronary arteries.  .Marland KitchenPOLYPECTOMY    . UPPER GASTROINTESTINAL ENDOSCOPY  No Known Allergies  Current Outpatient Medications  Medication Sig Dispense Refill  . aspirin EC 81 MG tablet Take 81 mg by mouth daily.    . benazepril (LOTENSIN) 40 MG tablet TAKE 1 TABLET (40 MG TOTAL) BY MOUTH DAILY. 90 tablet 3  . Omega-3 Fatty Acids (FISH OIL) 1000 MG CAPS Take 2 capsules by mouth daily.    Marland Kitchen omeprazole (PRILOSEC) 40 MG capsule TAKE ONE CAPSULE BY MOUTH EVERY DAY 30 capsule 6  . atorvastatin (LIPITOR) 40 MG tablet Take 1 tablet (40 mg total) by mouth daily. 90 tablet 3   No current facility-administered medications for this visit.     Socially he is married has 3 children and 2 grandchildren. There is a remote tobacco history but he quit over 30 years ago. 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 He is able to exercise without restriction. He does play golf and rides a bike.  Family History  Problem Relation Age of Onset  . Heart disease Mother   . Heart disease Father   . Arthritis Father   . Cirrhosis  Brother   . Hypertension Maternal Aunt   . Hypertension Brother   . Hyperlipidemia Brother     ROS General: Negative; No fevers, chills, or night sweats;  HEENT: Negative; No changes in vision or hearing, sinus congestion, difficulty swallowing Pulmonary: Negative; No cough, wheezing, shortness of breath, hemoptysis Cardiovascular: Negative; No chest pain, presyncope, syncope, palpitations GI: Negative; No nausea, vomiting, diarrhea, or abdominal pain GU: Negative; No dysuria, hematuria, or difficulty voiding Musculoskeletal: Negative; no myalgias, joint pain, or weakness Hematologic/Oncology: Negative; no easy bruising, bleeding Endocrine: Negative; no heat/cold intolerance; no diabetes Neuro: See history of present illness; no changes in balance, headaches Skin: Negative; No rashes or skin lesions Psychiatric: Negative; No behavioral problems, depression Sleep: Positive for sleep apnea on CPAP therapy;  He admits to 100% compliance; No snoring, daytime sleepiness, hypersomnolence, bruxism, restless legs, hypnogognic hallucinations, no cataplexy Other comprehensive 14 point system review is negative.   PE BP 120/80   Pulse (!) 48   Ht _0  (1.88 m)   Wt 218 lb 9.6 oz (99.2 kg)   BMI 28.07 kg/m    Repeat blood pressure by me was 130/80  Wt Readings from Last 3 Encounters:  03/12/18 218 lb 9.6 oz (99.2 kg)  03/04/18 221 lb (100.2 kg)  04/04/17 214 lb (97.1 kg)   General: Alert, oriented, no distress.  Skin: normal turgor, no rashes, warm and dry HEENT: Normocephalic, atraumatic. Pupils equal round and reactive to light; sclera anicteric; extraocular muscles intact; Nose without nasal septal hypertrophy Mouth/Parynx benign; Mallinpatti scale 3 Neck: No JVD, no carotid bruits; normal carotid upstroke Lungs: clear to ausculatation and percussion; no wheezing or rales Chest wall: without tenderness to palpitation Heart: PMI not displaced, RRR, s1 s2 normal, 1/6 systolic  murmur, no diastolic murmur, no rubs, gallops, thrills, or heaves Abdomen: soft, nontender; no hepatosplenomehaly, BS+; abdominal aorta nontender and not dilated by palpation. Back: no CVA tenderness Pulses 2+ Musculoskeletal: full range of motion, normal strength, no joint deformities Extremities: no clubbing cyanosis or edema, Homan's sign negative  Neurologic: grossly nonfocal; Cranial nerves grossly wnl Psychologic: Normal mood and affect; normal cognition   ECG (independently read by me): Sinus bradycardia at 48 bpm with mild sinus arrhythmia and first-degree AV block.  Isolated PAC.  PR interval 232 ms.  QTc interval 394 ms.  May  2018 ECG (independently read by me): Normal sinus rhythm at 60 bpm.  First degree AV block.  PAC.  PR interval 214 ms per QTc interval 394 ms.  March 2018 ECG (independently read by me): Normal sinus rhythm at 63 bpm with sinus arrhythmia, first-degree AV block, nonspecific T changes.  March 2017 ECG (independently read by me):  Sinus bradycardia 50 bpm with first-degree block.  No ectopy. Mild T wave abnormality inferiorly.  September 2016 ECG (independently read by me): Sinus bradycardia 53 bpm.  First-degree AV block with a PR interval at 224 ms.  ECG (independently read by me): Normal sinus rhythm at 63 bpm.  Occasional PVC.  First-degree AV block with a PR interval at 212.  September 2015 ECG (independently read by me): Sinus bradycardia 51 beats per minute.  Borderline first-degree V. block with a PR interval of 208 ms.  QTc interval 411 milliseconds  November 23, 2013 ECG (independently read by me): Sinus bradycardia with very mild first-degree block with a PR interval of 218 ms. No significant ST changes.  LABS:  BMP Latest Ref Rng & Units 03/12/2018 03/28/2017 08/09/2015  Glucose 65 - 99 mg/dL 89 90 90  BUN 8 - 27 mg/dL _0 Creatinine 0.76 - 1.27 mg/dL 1.10 1.10 1.01  BUN/Creat Ratio 10 - 24 16 - -  Sodium 134 - 144 mmol/L 142 140 137   Potassium 3.5 - 5.2 mmol/L 5.0 5.0 5.3  Chloride 96 - 106 mmol/L 104 107 105  CO2 20 - 29 mmol/L _1 Calcium 8.6 - 10.2 mg/dL 9.8 9.2 9.4   Hepatic Function Latest Ref Rng & Units 03/12/2018 03/28/2017 08/09/2015  Total Protein 6.0 - 8.5 g/dL 6.8 6.9 6.9  Albumin 3.5 - 4.8 g/dL 4.4 4.5 4.4  AST 0 - 40 IU/L _2 ALT 0 - 44 IU/L _3 Alk Phosphatase 39 - 117 IU/L 44 38(L) 45  Total Bilirubin 0.0 - 1.2 mg/dL 0.5 0.7 0.5   CBC Latest Ref Rng & Units 08/09/2015 02/06/2015 04/26/2014  WBC 4.0 - 10.5 K/uL 4.4 4.7 6.8  Hemoglobin 13.0 - 17.0 g/dL 14.0 14.5 14.4  Hematocrit 39.0 - 52.0 % 43.1 44.4 43.6  Platelets 150 - 400 K/uL 319 305 265   Lab Results  Component Value Date   MCV 92.3 08/09/2015   MCV 92.7 02/06/2015   MCV 91.8 04/26/2014   Lab Results  Component Value Date   TSH 1.184 08/09/2015   Lab Results  Component Value Date   HGBA1C  05/08/2009    4.6 (NOTE) The ADA recommends the following therapeutic goal for glycemic control related to Hgb A1c measurement: Goal of therapy: <6.5 Hgb A1c  Reference: American Diabetes Association: Clinical Practice Recommendations 2010, Diabetes Care, 2010, 33: (Suppl  1).   BNP No results found for: PROBNP  Lipid Panel     Component Value Date/Time   CHOL 139 03/12/2018 1057   TRIG 93 03/12/2018 1057   HDL 37 (L) 03/12/2018 1057   CHOLHDL 3.8 03/12/2018 1057   CHOLHDL 3.5 03/28/2017 0925   VLDL 26 03/28/2017 0925   LDLCALC 83 03/12/2018 1057     RADIOLOGY: No results found.  IMPRESSION:  1. S/P mitral valve repair   2. Essential hypertension   3. Hyperlipidemia LDL goal <70   4. OSA (obstructive sleep apnea)    ASSESSMENT AND PLAN: Edgar. Deondrae Mcgrail is a 72 year-old gentleman who has a remote history of SBE  and mitral valve endocarditis for which he required mitral valve repair surgery with quadrangular resection of his P2 segment of the mitral valve at which time a 32 mm Edwards annuloplasty ring was inserted  in 2010.  His  echo Doppler study in March 2017 showed stable mitral valve repair without stenosis or regurgitation.  He had normal systolic function with mild LVH.  There was evidence for mild aortic stenosis with a mean gradient of 11.  There was evidence for biatrial enlargement.  When I last saw him in March 2018, he was hypertensive and I further titrated benazapril from 20 ro 40 mg.  since that time, his blood pressure has remained stable.  His blood pressure today is excellent on his current regimen.  He continues to take atorvastatin for hyperlipidemia.  LDL cholesterol of May 2018 was 81 with a total cholesterol 149.  He has obstructive sleep apnea and admits to 100% compliance with reference to utilization of CPAP therapy.  He is asymptomatic with reference to chest pain, palpitations or significant shortness of breath.  He is not on any negative chronotropic medications.  He has mild sinus bradycardia on ECG, which is stable.  I reviewed his last echo Doppler study from 02/13/2017, which continue to show a ejection fraction at 55-60%.  There was trivial aortic insufficiency.  His mitral valve annuloplasty ring was present and function normally.  There was no Edgar.  He had biatrial enlargement.  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  I will see him in one year for reevaluation, and prior to that office visit.  I will repeat a 2-D echo Doppler study for follow-up assessment prior to that evaluation.   Time spent: 25 minutes  Troy Sine, MD, [facility] 03/14/2018 3:52 PM

## 2018-03-12 NOTE — Patient Instructions (Signed)
Medication Instructions:  Your physician recommends that you continue on your current medications as directed. Please refer to the Current Medication list given to you today.  Testing: Your physician has requested that you have an echocardiogram in 1 year. Echocardiography is a painless test that uses sound waves to create images of your heart. It provides your doctor with information about the size and shape of your heart and how well your heart's chambers and valves are working. This procedure takes approximately one hour. There are no restrictions for this procedure.  Follow-Up: Your physician wants you to follow-up in: 1 year with Dr. Tresa Endo. You will receive a reminder letter in the mail two months in advance. If you don't receive a letter, please call our office to schedule the follow-up appointment.   Any Other Special Instructions Will Be Listed Below (If Applicable).     If you need a refill on your cardiac medications before your next appointment, please call your pharmacy.

## 2018-03-14 ENCOUNTER — Encounter: Payer: Self-pay | Admitting: Cardiovascular Disease

## 2018-03-17 ENCOUNTER — Encounter: Payer: Self-pay | Admitting: *Deleted

## 2018-04-03 ENCOUNTER — Ambulatory Visit: Payer: Medicare Other | Admitting: Neurology

## 2018-08-10 ENCOUNTER — Other Ambulatory Visit: Payer: Self-pay | Admitting: Cardiovascular Disease

## 2018-09-03 ENCOUNTER — Telehealth: Payer: Self-pay | Admitting: Gastroenterology

## 2018-09-03 NOTE — Telephone Encounter (Signed)
Dr Christella Hartigan please advise if the pt can have colon now instead of April 2020.

## 2018-09-03 NOTE — Telephone Encounter (Signed)
Patient states he spoke with his insurance and they have him due for a colonoscopy. Patient in our system for a 04.2020 recall. Patient wanting to know if he can have it done before the end of this year due to insurance reasons. Pt not having any gi symptoms but has hx of colon polyps and family hx colon polyps in brothers. Please advise.

## 2018-09-06 NOTE — Telephone Encounter (Signed)
Colonoscopy 02/2009 Dr. Virginia Rochester found a single hyperplastic polyp.  He is not due for routine risk screening until 02/2019.  I'm happy to do it a bit early.  OK to schedule for [facility] colonoscopy, thanks

## 2018-09-09 ENCOUNTER — Encounter: Payer: Self-pay | Admitting: Gastroenterology

## 2018-09-09 NOTE — Telephone Encounter (Signed)
Scheduled patient for colon on 12.10.19 and pv on 11.25.19. Letter sent.

## 2018-09-10 ENCOUNTER — Ambulatory Visit (HOSPITAL_COMMUNITY): Payer: Medicare Other | Attending: Cardiovascular Disease

## 2018-09-10 ENCOUNTER — Other Ambulatory Visit: Payer: Self-pay

## 2018-09-10 DIAGNOSIS — Z9889 Other specified postprocedural states: Secondary | ICD-10-CM | POA: Diagnosis not present

## 2018-09-10 DIAGNOSIS — I10 Essential (primary) hypertension: Secondary | ICD-10-CM | POA: Insufficient documentation

## 2018-10-05 ENCOUNTER — Other Ambulatory Visit: Payer: Self-pay

## 2018-10-05 ENCOUNTER — Ambulatory Visit (AMBULATORY_SURGERY_CENTER): Payer: Self-pay | Admitting: *Deleted

## 2018-10-05 VITALS — Ht 74.0 in | Wt 224.0 lb

## 2018-10-05 DIAGNOSIS — Z1211 Encounter for screening for malignant neoplasm of colon: Secondary | ICD-10-CM

## 2018-10-05 MED ORDER — PEG 3350-KCL-NA BICARB-NACL 420 G PO SOLR: 4000.0000 mL | Freq: Once | ORAL | 0 refills | Status: AC

## 2018-10-05 NOTE — Progress Notes (Signed)
No egg or soy allergy known to patient  No issues with past sedation with any surgeries  or procedures, no intubation problems  No diet pills per patient No home 02 use per patient  No blood thinners per patient  Pt denies issues with constipation  No A fib or A flutter  EMMI video sent to pt's e mail  

## 2018-10-20 ENCOUNTER — Encounter: Payer: Self-pay | Admitting: Gastroenterology

## 2018-10-20 ENCOUNTER — Ambulatory Visit (AMBULATORY_SURGERY_CENTER): Payer: Medicare Other | Admitting: Gastroenterology

## 2018-10-20 VITALS — BP 114/70 | HR 51 | Temp 98.4°F | Resp 21 | Ht 74.0 in | Wt 224.0 lb

## 2018-10-20 DIAGNOSIS — Z1211 Encounter for screening for malignant neoplasm of colon: Secondary | ICD-10-CM | POA: Diagnosis not present

## 2018-10-20 MED ORDER — SODIUM CHLORIDE 0.9 % IV SOLN: 500.0000 mL | INTRAVENOUS | Status: DC

## 2018-10-20 NOTE — Progress Notes (Signed)
Report to PACU, RN, vss, BBS= Clear.  

## 2018-10-20 NOTE — Patient Instructions (Signed)
No handouts given.   YOU HAD AN ENDOSCOPIC PROCEDURE TODAY AT THE [facility]:   Refer to the procedure report that was given to you for any specific questions about what was found during the examination.  If the procedure report does not answer your questions, please call your gastroenterologist to clarify.  If you requested that your care partner not be given the details of your procedure findings, then the procedure report has been included in a sealed envelope for you to review at your convenience later.  YOU SHOULD EXPECT: Some feelings of bloating in the abdomen. Passage of more gas than usual.  Walking can help get rid of the air that was put into your GI tract during the procedure and reduce the bloating. If you had a lower endoscopy (such as a colonoscopy or flexible sigmoidoscopy) you may notice spotting of blood in your stool or on the toilet paper. If you underwent a bowel prep for your procedure, you may not have a normal bowel movement for a few days.  Please Note:  You might notice some irritation and congestion in your nose or some drainage.  This is from the oxygen used during your procedure.  There is no need for concern and it should clear up in a day or so.  SYMPTOMS TO REPORT IMMEDIATELY:   Following lower endoscopy (colonoscopy or flexible sigmoidoscopy):  Excessive amounts of blood in the stool  Significant tenderness or worsening of abdominal pains  Swelling of the abdomen that is new, acute  Fever of 100F or higher   For urgent or emergent issues, a gastroenterologist can be reached at any hour by calling [phone].   DIET:  We do recommend a small meal at first, but then you may proceed to your regular diet.  Drink plenty of fluids but you should avoid alcoholic beverages for 24 hours.  ACTIVITY:  You should plan to take it easy for the rest of today and you should NOT DRIVE or use heavy machinery until tomorrow (because of the sedation medicines  used during the test).    FOLLOW UP: Our staff will call the number listed on your records the next business day following your procedure to check on you and address any questions or concerns that you may have regarding the information given to you following your procedure. If we do not reach you, we will leave a message.  However, if you are feeling well and you are not experiencing any problems, there is no need to return our call.  We will assume that you have returned to your regular daily activities without incident.  If any biopsies were taken you will be contacted by phone or by letter within the next 1-3 weeks.  Please call us at [phone] if you have not heard about the biopsies in 3 weeks.    SIGNATURES/CONFIDENTIALITY: You and/or your care partner have signed paperwork which will be entered into your electronic medical record.  These signatures attest to the fact that that the information above on your After Visit Summary has been reviewed and is understood.  Full responsibility of the confidentiality of this discharge information lies with you and/or your care-partner. 

## 2018-10-20 NOTE — Op Note (Signed)
[facility] Patient Name: Edgar Jones Procedure Date: 10/20/2018 9:00 AM MRN: [id number] Endoscopist: Rachael Fee , MD Age: 72 Referring MD:  Date of Birth: 11-18-1945 Gender: Male Account #: [id number] Procedure:                Colonoscopy Indications:              Screening for colorectal malignant neoplasm Medicines:                Monitored Anesthesia Care Procedure:                Pre-Anesthesia Assessment:                           - Prior to the procedure, a History and Physical                            was performed, and patient medications and                            allergies were reviewed. The patient's tolerance of                            previous anesthesia was also reviewed. The risks                            and benefits of the procedure and the sedation                            options and risks were discussed with the patient.                            All questions were answered, and informed consent                            was obtained. Prior Anticoagulants: The patient has                            taken no previous anticoagulant or antiplatelet                            agents. ASA Grade Assessment: II - A patient with                            mild systemic disease. After reviewing the risks                            and benefits, the patient was deemed in                            satisfactory condition to undergo the procedure.                           After obtaining informed consent, the colonoscope  was passed under direct vision. Throughout the                            procedure, the patient's blood pressure, pulse, and                            oxygen saturations were monitored continuously. The                            Model PCF-H190DL [id number](SN#[id number]) scope was introduced                            through the anus and advanced to the the cecum,                            identified by  appendiceal orifice and ileocecal                            valve. The colonoscopy was performed without                            difficulty. The patient tolerated the procedure                            well. The quality of the bowel preparation was                            good. The ileocecal valve, appendiceal orifice, and                            rectum were photographed. Scope In: 9:13:14 AM Scope Out: 9:23:24 AM Scope Withdrawal Time: 0 hours 5 minutes 44 seconds  Total Procedure Duration: 0 hours 10 minutes 10 seconds  Findings:                 The entire examined colon appeared normal on direct                            and retroflexion views. Complications:            No immediate complications. Estimated blood loss:                            None. Estimated Blood Loss:     Estimated blood loss: none. Impression:               - The entire examined colon is normal on direct and                            retroflexion views.                           - No polyps or cancers. Recommendation:           - Patient has a contact number available for  emergencies. The signs and symptoms of potential                            delayed complications were discussed with the                            patient. Return to normal activities tomorrow.                            Written discharge instructions were provided to the                            patient.                           - Resume previous diet.                           - Continue present medications.                           - You do not need any further colon cancer                            screening tests (including stool testing). These                            types of tests generally stop around age 98-80. Rachael Fee, MD 10/20/2018 9:25:19 AM This report has been signed electronically.

## 2018-10-20 NOTE — Progress Notes (Signed)
Pt's states no medical or surgical changes since previsit or office visit. 

## 2018-10-21 ENCOUNTER — Telehealth: Payer: Self-pay

## 2018-10-21 NOTE — Telephone Encounter (Signed)
  Follow up Call-  Call back number 10/20/2018  Post procedure Call Back phone  # [phone]  Permission to leave phone message Yes  Some recent data might be hidden     Patient questions:  Do you have a fever, pain , or abdominal swelling? No. Pain Score  0 *  Have you tolerated food without any problems? Yes.    Have you been able to return to your normal activities? Yes.    Do you have any questions about your discharge instructions: Diet   No. Medications  No. Follow up visit  No.  Do you have questions or concerns about your Care? No.  Actions: * If pain score is 4 or above: No action needed, pain <4.

## 2018-11-25 ENCOUNTER — Telehealth: Payer: Self-pay | Admitting: Cardiovascular Disease

## 2018-11-25 MED ORDER — ATORVASTATIN CALCIUM 40 MG PO TABS: 40.0000 mg | ORAL_TABLET | Freq: Every day | ORAL | 3 refills | Status: DC

## 2018-11-25 NOTE — Telephone Encounter (Signed)
Rx for Atorvastatin 40 mg sent in to [facility] per fax request.

## 2018-11-26 ENCOUNTER — Other Ambulatory Visit: Payer: Self-pay

## 2018-11-26 MED ORDER — BENAZEPRIL HCL 40 MG PO TABS: 40.0000 mg | ORAL_TABLET | Freq: Every day | ORAL | 1 refills | Status: DC

## 2018-12-09 ENCOUNTER — Other Ambulatory Visit: Payer: Self-pay | Admitting: Cardiovascular Disease

## 2018-12-09 MED ORDER — BENAZEPRIL HCL 40 MG PO TABS: 40.0000 mg | ORAL_TABLET | Freq: Every day | ORAL | 2 refills | Status: DC

## 2018-12-09 NOTE — Telephone Encounter (Signed)
Rx(s) sent to pharmacy electronically.  

## 2018-12-09 NOTE — Telephone Encounter (Signed)
°*  STAT* If patient is at the pharmacy, call can be transferred to refill team.   1. Which medications need to be refilled? (please list name of each medication and dose if known)   benazepril (LOTENSIN) 40 MG tablet  2. Which pharmacy/location (including street and city if local pharmacy) is medication to be sent to?  [facility] - [place], [place] - [address]  3. Do they need a 30 day or 90 day supply?  90 days

## 2019-02-04 ENCOUNTER — Telehealth: Payer: Self-pay | Admitting: Physician Assistant

## 2019-02-04 NOTE — Telephone Encounter (Signed)
   Primary Cardiologist:  Nicki Guadalajara, MD   Patient contacted.  History reviewed.  No symptoms to suggest any unstable cardiac conditions.  Based on discussion, with current pandemic situation, we will be postponing this appointment for Edgar Jones with a plan for f/u in 8 wks or sooner if feasible/necessary.  If symptoms change, he has been instructed to contact our office.   Routing to C19 CANCEL pool for tracking (P CV DIV CV19 CANCEL - reason for visit "other.") and assigning priority (2 = 6-12 wks).   [place], [place]  02/04/2019 8:57 AM         .

## 2019-02-05 ENCOUNTER — Ambulatory Visit: Payer: Medicare Other | Admitting: Physician Assistant

## 2019-02-25 ENCOUNTER — Telehealth: Payer: Self-pay | Admitting: Physician Assistant

## 2019-02-25 NOTE — Telephone Encounter (Signed)
LVM to schedule. 02-25-19 ST °

## 2019-03-03 ENCOUNTER — Telehealth: Payer: Self-pay | Admitting: Neurology

## 2019-03-03 NOTE — Telephone Encounter (Signed)
I called pt to update his chart. No answer, left a message asking him to call me back. 

## 2019-03-03 NOTE — Telephone Encounter (Signed)
Due to current COVID 19 pandemic, our office is severely reducing in office visits for at least the next 2 weeks, in order to minimize the risk to our patients and healthcare providers. Pt understands that although there may be some limitations with this type of visit, we will take all precautions to reduce any security or privacy concerns.  Pt understands that this will be treated like an in office visit and we will file with pt's insurance, and there may be a patient responsible charge related to this service. Pt's email is [email or web address]. Pt understands that the cisco webex software must be downloaded and operational on the device pt plans to use for the visit.

## 2019-03-08 ENCOUNTER — Other Ambulatory Visit: Payer: Self-pay

## 2019-03-08 ENCOUNTER — Encounter: Payer: Self-pay | Admitting: Neurology

## 2019-03-08 ENCOUNTER — Telehealth: Payer: Self-pay | Admitting: Physician Assistant

## 2019-03-08 ENCOUNTER — Ambulatory Visit (INDEPENDENT_AMBULATORY_CARE_PROVIDER_SITE_OTHER): Payer: Medicare Other | Admitting: Neurology

## 2019-03-08 DIAGNOSIS — I679 Cerebrovascular disease, unspecified: Secondary | ICD-10-CM | POA: Diagnosis not present

## 2019-03-08 NOTE — Progress Notes (Signed)
     Virtual Visit via Video Note  I connected with Edgar Jones on 03/08/19 at  8:00 AM EDT by a video enabled telemedicine application and verified that I am speaking with the correct person using two identifiers.   I discussed the limitations of evaluation and management by telemedicine and the availability of in person appointments. The patient expressed understanding and agreed to proceed.  The patient is at home, physician in office.  History of Present Illness: Edgar Jones is a 73 year old right-handed white male with a history of cerebrovascular disease.  In 2015 he had two TIA type events, he has been on blood pressure medications, aspirin, and a statin drug since that time, he has not had any recurrence.  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  The patient reports no new symptoms over the last year.  He denies any headache, vision changes, or numbness or weakness of the face, arms, legs.  He denies any speech or swallowing problems and he denies any balance issues.  He has not reported any new medical issues that have come up since last seen.  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  The patient does report occasional episodes of shortness of breath, he denies any palpitations of the heart or severe fatigue with these events.   Observations/Objective: WebEx evaluation reveals that the patient is alert and cooperative, he is responding to questions appropriately.  Speech is well enunciated, not aphasic or dysarthric.  Extraocular movements are full.  Face is symmetric, he is able to protrude the tongue in the midline with good lateral movement of the tongue.  He has good finger-nose-finger and heel shin bilaterally.  Gait is normal.  Tandem gait normal.  Romberg is negative.  Assessment and Plan: 1.  Cerebrovascular disease  The patient is stable from the last visit.  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  We will mail him the letter.  He will follow-up in 1 year.  Follow Up Instructions: 1 year follow-up, may see nurse practitioner.   I discussed the assessment and treatment plan with the patient. The patient was provided an opportunity to ask questions and all were answered. The patient agreed with the plan and demonstrated an understanding of the instructions.   The patient was advised to call back or seek an in-person evaluation if the symptoms worsen or if the condition fails to improve as anticipated.  I provided 15 minutes of non-face-to-face time during this encounter.   York Spaniel, MD

## 2019-03-08 NOTE — Telephone Encounter (Signed)
Smartphone/consent/ my chart/ pre reg completed °

## 2019-03-08 NOTE — Progress Notes (Signed)
Letter dictated by Dr. Willis has been placed in out going mail at [facility]. 

## 2019-03-09 ENCOUNTER — Encounter: Payer: Self-pay | Admitting: Physician Assistant

## 2019-03-09 ENCOUNTER — Telehealth (INDEPENDENT_AMBULATORY_CARE_PROVIDER_SITE_OTHER): Payer: Medicare Other | Admitting: Physician Assistant

## 2019-03-09 ENCOUNTER — Telehealth: Payer: Self-pay

## 2019-03-09 DIAGNOSIS — Z9889 Other specified postprocedural states: Secondary | ICD-10-CM

## 2019-03-09 DIAGNOSIS — Z9989 Dependence on other enabling machines and devices: Secondary | ICD-10-CM

## 2019-03-09 DIAGNOSIS — I10 Essential (primary) hypertension: Secondary | ICD-10-CM

## 2019-03-09 DIAGNOSIS — E785 Hyperlipidemia, unspecified: Secondary | ICD-10-CM

## 2019-03-09 DIAGNOSIS — Z8673 Personal history of transient ischemic attack (TIA), and cerebral infarction without residual deficits: Secondary | ICD-10-CM

## 2019-03-09 DIAGNOSIS — G4733 Obstructive sleep apnea (adult) (pediatric): Secondary | ICD-10-CM

## 2019-03-09 NOTE — Progress Notes (Signed)
Virtual Visit via Video Note   This visit type was conducted due to national recommendations for restrictions regarding the COVID-19 Pandemic (e.g. social distancing) in an effort to limit this patient's exposure and mitigate transmission in our community.  Due to his co-morbid illnesses, this patient is at least at moderate risk for complications without adequate follow up.  This format is felt to be most appropriate for this patient at this time.  All issues noted in this document were discussed and addressed.  A limited physical exam was performed with this format.  Please refer to the patient's chart for his consent to telehealth for [facility]   Evaluation Performed:  Follow-up visit  Date:  03/09/2019   ID:  Edgar Jones, DOB 10-11-1946, MRN [id number]  Patient Location: Home Provider Location: Home  PCP:  Roger Kill, PA-C  Cardiologist:  Nicki Guadalajara, MD Electrophysiologist:  None   Chief Complaint:  Annual followup  History of Present Illness:    Edgar Jones is a 73 y.o. 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 Dorris Fetch.  Cardiac catheterization in 2010 showed normal coronaries.  He has been CPAP therapy since 2010 as well.  Previous carotid Doppler did not show any significant abnormality.  He also had prior history of TIA as well, this is been followed by neurology Dr. Anne Hahn.  Cardiogram obtained on 09/10/2018 showed EF 55 to 60%, grade 2 DD, stable mitral valve repair with mean gradient 2 mmHg, moderate LAE.  Patient was contacted today via doximity video conference visit.  Overall he is still feeling very well.  He occasionally has episodes where he cannot take a full deep breath however the episodes is quite transient and does not interfere with his everyday activity.  He denies any  exertional chest pain, lower extremity edema, orthopnea or PND.  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  He was seen by Dr. Anne Hahn of neurology yesterday, he is quite stable from neurological perspective as well.  He is due for repeat fasting lipid panel and a liver function test, however given the recent COVID-19, this can be done in later half of this year after COVID-19 pandemic is over and is nonurgent.  He is due for repeat echocardiogram in November of this year.  Otherwise he can follow-up with Dr. Tresa Endo in 1 year.  He is cleared to continue to drive a truck at this time.  The patient does not have symptoms concerning for COVID-19 infection (fever, chills, cough, or new shortness of breath).    Past Medical History:  Diagnosis Date  . Dyslipidemia   . Endocarditis   . GERD (gastroesophageal reflux disease)   . Heart murmur   . History of stress test 11/22/2010   Perfusion defect in the inferior myocardial region is consistent with diaphragmatic attenuation. The remaining myocardium demonstrates normal myocardial perfusion with no evidence of ischemia or infarct. The post stress left ventricle is normal in size. The post stress EF 65%, Gobal left ventricle systolic function is normal. No significant wall motion abnormalities noted. Normal Myocardial perfus  . Hx of echocardiogram 02/10/2013   The cavity size was normal. Ststolic function was normal. The estimated EF was in the range 55%-60%. Wall motion was normal; there was no regional wall motion abnormalities. Stable MV  repair & annuloplasty ring. Trace MR  . Hyperlipidemia   . Hypertension   . OSA on CPAP   . SBE (subacute bacterial endocarditis)   . Sleep apnea   . Stroke [facility])    3/10 endocarditis/embolic   Past Surgical History:  Procedure Laterality Date  . CARDIAC CATHETERIZATION  01/2009  . COLONOSCOPY    . HERNIA REPAIR     right abd  . INGUINAL HERNIA REPAIR     left side  . MITRAL VALVE REPAIR  04/2009   32mm  Edwards annuloplasty ring and quadrangular resection pf P2 segment of the motral valve by Dr Dorris Fetch with normal coronary arteries.  Marland Kitchen POLYPECTOMY    . UPPER GASTROINTESTINAL ENDOSCOPY       Current Meds  Medication Sig  . aspirin EC 81 MG tablet Take 81 mg by mouth daily.  Marland Kitchen atorvastatin (LIPITOR) 40 MG tablet Take 1 tablet (40 mg total) by mouth daily.  . benazepril (LOTENSIN) 40 MG tablet Take 1 tablet (40 mg total) by mouth daily.  . Omega-3 Fatty Acids (FISH OIL) 1000 MG CAPS Take 2 capsules by mouth daily.  Marland Kitchen omeprazole (PRILOSEC) 40 MG capsule TAKE ONE CAPSULE BY MOUTH EVERY DAY     Allergies:   Patient has no known allergies.   Social History   Tobacco Use  . Smoking status: Former Smoker    Packs/day: 2.00    Years: 20.00    Pack years: 40.00    Types: Cigarettes    Last attempt to quit: 04/29/1981    Years since quitting: 37.8  . Smokeless tobacco: Never Used  Substance Use Topics  . Alcohol use: Yes    Alcohol/week: 14.0 standard drinks    Types: 14 Standard drinks or equivalent per week    Comment: 2 glasses wine every day  . Drug use: No     Family Hx: The patient's family history includes Arthritis in his father; Cirrhosis in his brother; Colon polyps in his brother; Heart disease in his father and mother; Hyperlipidemia in his brother; Hypertension in his brother and maternal aunt. There is no history of Colon cancer, Esophageal cancer, Stomach cancer, or Rectal cancer.  ROS:   Please see the history of present illness.     All other systems reviewed and are negative.   Prior CV studies:   The following studies were reviewed today:  Echo 09/10/2018 LV EF: 55% -   60% Study Conclusions  - Left ventricle: The cavity size was normal. There was mild   concentric hypertrophy. Systolic function was normal. The   estimated ejection fraction was in the range of 55% to 60%. Wall   motion was normal; there were no regional wall motion   abnormalities.  Features are consistent with a pseudonormal left   ventricular filling pattern, with concomitant abnormal relaxation   and increased filling pressure (grade 2 diastolic dysfunction).   Doppler parameters are consistent with high ventricular filling   pressure. - Aortic valve: Trileaflet; moderately thickened, moderately   calcified leaflets. Valve mobility was restricted. - Mitral valve: S/ MV repair with annular prosthesis ring. Mild   calcification. There was trivial regurgitation. Mean gradient   (D): 2 mm Hg. Valve area by pressure half-time: 1.54 cm^2. Valve   area by continuity equation (using LVOT flow): 1.35 cm^2. - Left atrium: The atrium was moderately dilated. - Right ventricle: Systolic function was mildly reduced. - Tricuspid valve: There was mild regurgitation.  Labs/Other Tests and Data Reviewed:  EKG:  An ECG dated 03/12/2018 was personally reviewed today and demonstrated:  Sinus bradycardia with first-degree AV block.  Recent Labs: 03/12/2018: ALT 24; BUN 18; Creatinine, Ser 1.10; Potassium 5.0; Sodium 142   Recent Lipid Panel Lab Results  Component Value Date/Time   CHOL 139 03/12/2018 10:57 AM   TRIG 93 03/12/2018 10:57 AM   HDL 37 (L) 03/12/2018 10:57 AM   CHOLHDL 3.8 03/12/2018 10:57 AM   CHOLHDL 3.5 03/28/2017 09:25 AM   LDLCALC 83 03/12/2018 10:57 AM    Wt Readings from Last 3 Encounters:  10/20/18 224 lb (101.6 kg)  10/05/18 224 lb (101.6 kg)  03/12/18 218 lb 9.6 oz (99.2 kg)     Objective:    Vital Signs:  There were no vitals taken for this visit.   VITAL SIGNS:  reviewed No vital signs available  ASSESSMENT & PLAN:    1. Mitral valve repair: Repair performed in 2010 by Dr. Dorris FetchHendrickson.  Stable on recent echocardiogram.  Patient denies any exertional shortness of breath or chest discomfort.  2. Hypertension: No vital sign was available today as patient is away from his home and currently working.  His blood pressure medication has been  refilled back in January.  3. Hyperlipidemia: He is due for yearly fasting lipid panel and a liver function test, however this is nonurgent in light of COVID-19 pandemic.  I recommended him to have the lab work later half of this year  4. Obstructive sleep apnea on CPAP: He says he is compliant nearly 100% on CPAP therapy.  5. 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 Anne HahnWillis of neurology   COVID-19 Education: The signs and symptoms of COVID-19 were discussed with the patient and how to seek care for testing (follow up with PCP or arrange E-visit).  The importance of social distancing was discussed today.  Time:   Today, I have spent 7 minutes with the patient with telehealth technology discussing the above problems.     Medication Adjustments/Labs and Tests Ordered: Current medicines are reviewed at length with the patient today.  Concerns regarding medicines are outlined above.   Tests Ordered: Orders Placed This Encounter  Procedures  . ECHOCARDIOGRAM COMPLETE    Medication Changes: No orders of the defined types were placed in this encounter.   Disposition:  Follow up in 1 month(s)  Signed, Azalee CourseHao Kenly Henckel, [place]  03/09/2019 8:38 AM    [facility]

## 2019-03-09 NOTE — Patient Instructions (Addendum)
Medication Instructions:   Your physician recommends that you continue on your current medications as directed. Please refer to the Current Medication list given to you today.  If you need a refill on your cardiac medications before your next appointment, please call your pharmacy.   Lab work:  NONE ordered at this time of appointment   If you have labs (blood work) drawn today and your tests are completely normal, you will receive your results only by: Marland Kitchen MyChart Message (if you have MyChart) OR . A paper copy in the mail If you have any lab test that is abnormal or we need to change your treatment, we will call you to review the results.  Testing/Procedures:  Your physician has requested that you have an echocardiogram. Echocardiography is a painless test that uses sound waves to create images of your heart. It provides your doctor with information about the size and shape of your heart and how well your heart's chambers and valves are working. This procedure takes approximately one hour. There are no restrictions for this procedure.    Follow-Up: At [facility], you and your health needs are our priority.  As part of our continuing mission to provide you with exceptional heart care, we have created designated Provider Care Teams.  These Care Teams include your primary Cardiologist (physician) and Advanced Practice Providers (APPs -  Physician Assistants and Nurse Practitioners) who all work together to provide you with the care you need, when you need it. You will need a follow up appointment in 12 months (April 2021).  Please call our office 2 months in February 2021 to schedule this appointment.  You may see Nicki Guadalajara, MD or one of the following Advanced Practice Providers on your designated Care Team: [place], [place] . Micah Flesher, PA-C  Any Other Special Instructions Will Be Listed Below (If Applicable).  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

## 2019-03-09 NOTE — Telephone Encounter (Signed)
Virtual Visit Pre-Appointment Phone Call  "(Name), I am calling you today to discuss your upcoming appointment. We are currently trying to limit exposure to the virus that causes COVID-19 by seeing patients at home rather than in the office."  1. "What is the BEST phone number to call the day of the visit?" - include this in appointment notes  2. "Do you have or have access to (through a family member/friend) a smartphone with video capability that we can use for your visit?" a. If yes - list this number in appt notes as "cell" (if different from BEST phone #) and list the appointment type as a VIDEO visit in appointment notes b. If no - list the appointment type as a PHONE visit in appointment notes  3. Confirm consent - "In the setting of the current Covid19 crisis, you are scheduled for a VIDEO visit with Azalee CourseHao Meng, PA-C on 03/09/2019 at 8:00AM.  Just as we do with many in-office visits, in order for you to participate in this visit, we must obtain consent.  If you'd like, I can send this to your mychart (if signed up) or email for you to review.  Otherwise, I can obtain your verbal consent now.  All virtual visits are billed to your insurance company just like a normal visit would be.  By agreeing to a virtual visit, we'd like you to understand that the technology does not allow for your provider to perform an examination, and thus may limit your provider's ability to fully assess your condition. If your provider identifies any concerns that need to be evaluated in person, we will make arrangements to do so.  Finally, though the technology is pretty good, we cannot assure that it will always work on either your or our end, and in the setting of a video visit, we may have to convert it to a phone-only visit.  In either situation, we cannot ensure that we have a secure connection.  Are you willing to proceed?" STAFF: Did the patient verbally acknowledge consent to telehealth visit? Document YES/NO  here:  YES  4. Advise patient to be prepared - "Two hours prior to your appointment, go ahead and check your blood pressure, pulse, oxygen saturation, and your weight (if you have the equipment to check those) and write them all down. When your visit starts, your provider will ask you for this information. If you have an Apple Watch or Kardia device, please plan to have heart rate information ready on the day of your appointment. Please have a pen and paper handy nearby the day of the visit as well."  5. Give patient instructions for MyChart download to smartphone OR Doximity/Doxy.me as below if video visit (depending on what platform provider is using)  6. Inform patient they will receive a phone call 15 minutes prior to their appointment time (may be from unknown caller ID) so they should be prepared to answer    TELEPHONE CALL NOTE  Edgar Jones has been deemed a candidate for a follow-up tele-health visit to limit community exposure during the Covid-19 pandemic. I spoke with the patient via phone to ensure availability of phone/video source, confirm preferred email & phone number, and discuss instructions and expectations.  I reminded Edgar Jones to be prepared with any vital sign and/or heart rhythm information that could potentially be obtained via home monitoring, at the time of his visit. I reminded Edgar Jones to expect a phone call prior to  his visit.  Dorris Fetch, CMA 03/09/2019 8:37 AM   INSTRUCTIONS FOR DOWNLOADING THE MYCHART APP TO SMARTPHONE  - The patient must first make sure to have activated MyChart and know their login information - If Apple, go to [employer] and type in MyChart in the search bar and download the app. If Android, ask patient to go to [employer] and type in [place] in the search bar and download the app. The app is free but as with any other app downloads, their phone may require them to verify saved payment information or Apple/Android  password.  - The patient will need to then log into the app with their MyChart username and password, and select [facility] as their healthcare provider to link the account. When it is time for your visit, go to the MyChart app, find appointments, and click Begin Video Visit. Be sure to Select Allow for your device to access the Microphone and Camera for your visit. You will then be connected, and your provider will be with you shortly.  **If they have any issues connecting, or need assistance please contact MyChart service desk [phone])**  **If using a computer, in order to ensure the best quality for their visit they will need to use either of the following Internet Browsers: [employer] or Google Chrome**  IF USING DOXIMITY or DOXY.ME - The patient will receive a link just prior to their visit by text.     FULL LENGTH CONSENT FOR TELE-HEALTH VISIT   I hereby voluntarily request, consent and authorize [facility] and its employed or contracted physicians, physician assistants, nurse practitioners or other licensed health care professionals (the Practitioner), to provide me with telemedicine health care services (the "Services") as deemed necessary by the treating Practitioner. I acknowledge and consent to receive the Services by the Practitioner via telemedicine. I understand that the telemedicine visit will involve communicating with the Practitioner through live audiovisual communication technology and the disclosure of certain medical information by electronic transmission. I acknowledge that I have been given the opportunity to request an in-person assessment or other available alternative prior to the telemedicine visit and am voluntarily participating in the telemedicine visit.  I understand that I have the right to withhold or withdraw my consent to the use of telemedicine in the course of my care at any time, without affecting my right to future care or treatment,  and that the Practitioner or I may terminate the telemedicine visit at any time. I understand that I have the right to inspect all information obtained and/or recorded in the course of the telemedicine visit and may receive copies of available information for a reasonable fee.  I understand that some of the potential risks of receiving the Services via telemedicine include:  Marland Kitchen Delay or interruption in medical evaluation due to technological equipment failure or disruption; . Information transmitted may not be sufficient (e.g. poor resolution of images) to allow for appropriate medical decision making by the Practitioner; and/or  . In rare instances, security protocols could fail, causing a breach of personal health information.  Furthermore, I acknowledge that it is my responsibility to provide information about my medical history, conditions and care that is complete and accurate to the best of my ability. I acknowledge that Practitioner's advice, recommendations, and/or decision may be based on factors not within their control, such as incomplete or inaccurate data provided by me or distortions of diagnostic images or specimens that may result from electronic transmissions. I  understand that the practice of medicine is not an exact science and that Practitioner makes no warranties or guarantees regarding treatment outcomes. I acknowledge that I will receive a copy of this consent concurrently upon execution via email to the email address I last provided but may also request a printed copy by calling the office of [facility].    I understand that my insurance will be billed for this visit.   I have read or had this consent read to me. . I understand the contents of this consent, which adequately explains the benefits and risks of the Services being provided via telemedicine.  . I have been provided ample opportunity to ask questions regarding this consent and the Services and have had my questions  answered to my satisfaction. . I give my informed consent for the services to be provided through the use of telemedicine in my medical care  By participating in this telemedicine visit I agree to the above.

## 2019-03-17 ENCOUNTER — Ambulatory Visit: Payer: Medicare Other | Admitting: Cardiovascular Disease

## 2019-10-15 ENCOUNTER — Other Ambulatory Visit: Payer: Self-pay

## 2019-10-15 ENCOUNTER — Ambulatory Visit (HOSPITAL_COMMUNITY): Payer: Medicare Other | Attending: Cardiology

## 2019-10-15 DIAGNOSIS — I081 Rheumatic disorders of both mitral and tricuspid valves: Secondary | ICD-10-CM | POA: Insufficient documentation

## 2019-10-15 DIAGNOSIS — Z87891 Personal history of nicotine dependence: Secondary | ICD-10-CM | POA: Insufficient documentation

## 2019-10-15 DIAGNOSIS — I10 Essential (primary) hypertension: Secondary | ICD-10-CM | POA: Diagnosis not present

## 2019-10-15 DIAGNOSIS — Z954 Presence of other heart-valve replacement: Secondary | ICD-10-CM

## 2019-10-15 DIAGNOSIS — E785 Hyperlipidemia, unspecified: Secondary | ICD-10-CM | POA: Diagnosis not present

## 2019-10-15 DIAGNOSIS — Z9889 Other specified postprocedural states: Secondary | ICD-10-CM | POA: Diagnosis not present

## 2019-10-19 NOTE — Progress Notes (Signed)
The patient has been notified of the result and verbalized understanding.  All questions (if any) were answered. Jacqulynn Cadet, [place] 10/19/2019 10:56 AM

## 2019-10-22 ENCOUNTER — Other Ambulatory Visit: Payer: Self-pay | Admitting: Cardiovascular Disease

## 2019-10-26 NOTE — Telephone Encounter (Signed)
Rx has been sent to the pharmacy electronically. ° °

## 2019-11-15 ENCOUNTER — Other Ambulatory Visit: Payer: Self-pay | Admitting: Cardiovascular Disease

## 2019-12-27 ENCOUNTER — Other Ambulatory Visit: Payer: Self-pay | Admitting: Cardiovascular Disease

## 2020-03-08 ENCOUNTER — Encounter: Payer: Self-pay | Admitting: Neurology

## 2020-03-08 ENCOUNTER — Other Ambulatory Visit: Payer: Self-pay

## 2020-03-08 ENCOUNTER — Ambulatory Visit: Payer: Medicare Other | Admitting: Neurology

## 2020-03-08 VITALS — BP 128/78 | HR 60 | Temp 98.7°F | Ht 73.0 in | Wt 219.0 lb

## 2020-03-08 DIAGNOSIS — I679 Cerebrovascular disease, unspecified: Secondary | ICD-10-CM | POA: Diagnosis not present

## 2020-03-08 NOTE — Patient Instructions (Signed)
It was nice to see you today Call your cardiologist for a routine visit  See you back in 1 year

## 2020-03-08 NOTE — Progress Notes (Signed)
I have read the note, and I agree with the clinical assessment and plan.  Tiffanee Mcnee K Mayan Dolney   

## 2020-03-08 NOTE — Progress Notes (Signed)
PATIENT: Edgar Jones DOB: 08-08-1946  REASON FOR VISIT: follow up HISTORY FROM: patient  HISTORY OF PRESENT ILLNESS: Today 03/08/20  Mr. Darco is a 74 year old male with a history of cerebrovascular disease.  In 2015, he had 2 TIA type events, he has been on blood pressure medication, aspirin, and statin drug since that time.  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  He has not had any further TIA events.  He denies any changes to the vision, numbness or weakness to the arms or legs, changes to the balance, chest pain, palpitations.  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  He feels he is overall doing well.  He says he may have some shortness of breath, at the end of a long workday, otherwise no problems.  He presents today for annual evaluation.   Dr. Claiborne Billings,  HISTORY 03/08/2019 Dr. Jannifer Franklin: Edgar Jones is a 74 year old right-handed white male with a history of cerebrovascular disease.  In 2015 he had two TIA type events, he has been on blood pressure medications, aspirin, and a statin drug since that time, he has not had any recurrence.  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  The patient reports no new symptoms over the last year.  He denies any headache, vision changes, or numbness or weakness of the face, arms, legs.  He denies any speech or swallowing problems and he denies any balance issues.  He has not reported any new medical issues that have come up since last seen.  He continues to work as a Administrator.  The patient does report occasional episodes of shortness of breath, he denies any palpitations of the heart or severe fatigue with these events.  REVIEW OF SYSTEMS: Out of a complete 14 system review of symptoms, the patient complains only of the following symptoms, and all other reviewed systems are negative.  N/A  ALLERGIES: No Known Allergies  HOME MEDICATIONS: Outpatient Medications Prior to Visit   Medication Sig Dispense Refill  . aspirin EC 81 MG tablet Take 81 mg by mouth daily.    Marland Kitchen atorvastatin (LIPITOR) 40 MG tablet TAKE 1 TABLET BY MOUTH  DAILY 90 tablet 3  . benazepril (LOTENSIN) 40 MG tablet Take 1 tablet (40 mg total) by mouth daily. NEED OV. 90 tablet 0  . Omega-3 Fatty Acids (FISH OIL) 1000 MG CAPS Take 2 capsules by mouth daily.    Marland Kitchen omeprazole (PRILOSEC) 40 MG capsule TAKE ONE CAPSULE BY MOUTH EVERY DAY 30 capsule 6   No facility-administered medications prior to visit.    PAST MEDICAL HISTORY: Past Medical History:  Diagnosis Date  . Dyslipidemia   . Endocarditis   . GERD (gastroesophageal reflux disease)   . Heart murmur   . History of stress test 11/22/2010   Perfusion defect in the inferior myocardial region is consistent with diaphragmatic attenuation. The remaining myocardium demonstrates normal myocardial perfusion with no evidence of ischemia or infarct. The post stress left ventricle is normal in size. The post stress EF 65%, Gobal left ventricle systolic function is normal. No significant wall motion abnormalities noted. Normal Myocardial perfus  . Hx of echocardiogram 02/10/2013   The cavity size was normal. Ststolic function was normal. The estimated EF was in the range 55%-60%. Wall motion was normal; there was no regional wall motion abnormalities. Stable MV repair & annuloplasty ring. Trace MR  . Hyperlipidemia   .  Hypertension   . OSA on CPAP   . SBE (subacute bacterial endocarditis)   . Sleep apnea   . Stroke [facility])    3/10 endocarditis/embolic    PAST SURGICAL HISTORY: Past Surgical History:  Procedure Laterality Date  . CARDIAC CATHETERIZATION  01/2009  . COLONOSCOPY    . HERNIA REPAIR     right abd  . INGUINAL HERNIA REPAIR     left side  . 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  Marland Kitchen POLYPECTOMY    . UPPER  GASTROINTESTINAL ENDOSCOPY      FAMILY HISTORY: Family History  Problem Relation Age of Onset  . Heart disease Mother   . Heart disease Father   . Arthritis Father   . Cirrhosis Brother   . Hypertension Maternal Aunt   . Colon polyps Brother   . Hypertension Brother   . Hyperlipidemia Brother   . Colon cancer Neg Hx   . Esophageal cancer Neg Hx   . Stomach cancer Neg Hx   . Rectal cancer Neg Hx     SOCIAL HISTORY: Social History   Socioeconomic History  . Marital status: Married    Spouse name: Not on file  . Number of children: 2  . Years of education: BS  . Highest education level: Not on file  Occupational History  . Occupation: Retired    Associate Professor: DISABLED  . Occupation: [employer]  Tobacco Use  . Smoking status: Former Smoker    Packs/day: 2.00    Years: 20.00    Pack years: 40.00    Types: Cigarettes    Quit date: 04/29/1981    Years since quitting: 38.8  . Smokeless tobacco: Never Used  Substance and Sexual Activity  . Alcohol use: Yes    Alcohol/week: 14.0 standard drinks    Types: 14 Standard drinks or equivalent per week    Comment: 2 glasses wine every day  . Drug use: No  . Sexual activity: Not on file  Other Topics Concern  . Not on file  Social History Narrative  . Not on file   Social Determinants of Health   Financial Resource Strain:   . Difficulty of Paying Living Expenses:   Food Insecurity:   . Worried About Programme researcher, broadcasting/film/video in the Last Year:   . Barista in the Last Year:   Transportation Needs:   . Freight forwarder (Medical):   Marland Kitchen Lack of Transportation (Non-Medical):   Physical Activity:   . Days of Exercise per Week:   . Minutes of Exercise per Session:   Stress:   . Feeling of Stress :   Social Connections:   . Frequency of Communication with Friends and Family:   . Frequency of Social Gatherings with Friends and Family:   . Attends Religious Services:   . Active Member of Clubs or Organizations:   .  Attends Banker Meetings:   Marland Kitchen Marital Status:   Intimate Partner Violence:   . Fear of Current or Ex-Partner:   . Emotionally Abused:   Marland Kitchen Physically Abused:   . Sexually Abused:       PHYSICAL EXAM  Vitals:   03/08/20 0807 03/08/20 0810  BP: 128/78   Pulse: (!) 40 (!) 42  Temp: 98.7 F (37.1 C)   Weight: 219 lb (99.3 kg)   Height: 6\' 1"  (  1.854 m)    Body mass index is 28.89 kg/m.  Generalized: Well developed, in no acute distress   Neurological examination  Mentation: Alert oriented to time, place, history taking. Follows all commands speech and language fluent Cranial nerve II-XII: Pupils were equal round reactive to light. Extraocular movements were full, visual field were full on confrontational test. Facial sensation and strength were normal. Head turning and shoulder shrug  were normal and symmetric. Motor: The motor testing reveals 5 over 5 strength of all 4 extremities. Good symmetric motor tone is noted throughout.  Sensory: Sensory testing is intact to soft touch on all 4 extremities. No evidence of extinction is noted.  Coordination: Cerebellar testing reveals good finger-nose-finger and heel-to-shin bilaterally.  Gait and station: Gait is normal. Tandem gait is normal. Romberg is negative. No drift is seen.  Reflexes: Deep tendon reflexes are symmetric and normal bilaterally.   DIAGNOSTIC DATA (LABS, IMAGING, TESTING) - I reviewed patient records, labs, notes, testing and imaging myself where available.  Lab Results  Component Value Date   WBC 4.4 08/09/2015   HGB 14.0 08/09/2015   HCT 43.1 08/09/2015   MCV 92.3 08/09/2015   PLT 319 08/09/2015      Component Value Date/Time   NA 142 03/12/2018 1100   K 5.0 03/12/2018 1100   CL 104 03/12/2018 1100   CO2 24 03/12/2018 1100   GLUCOSE 89 03/12/2018 1100   GLUCOSE 90 03/28/2017 0925   BUN 18 03/12/2018 1100   CREATININE 1.10 03/12/2018 1100   CREATININE 1.10 03/28/2017 0925   CALCIUM 9.8  03/12/2018 1100   PROT 6.8 03/12/2018 1100   ALBUMIN 4.4 03/12/2018 1100   AST 17 03/12/2018 1100   ALT 24 03/12/2018 1100   ALKPHOS 44 03/12/2018 1100   BILITOT 0.5 03/12/2018 1100   GFRNONAA 67 03/12/2018 1100   GFRAA 77 03/12/2018 1100   Lab Results  Component Value Date   CHOL 139 03/12/2018   HDL 37 (L) 03/12/2018   LDLCALC 83 03/12/2018   TRIG 93 03/12/2018   CHOLHDL 3.8 03/12/2018   Lab Results  Component Value Date   HGBA1C  05/08/2009    4.6 (NOTE) The ADA recommends the following therapeutic goal for glycemic control related to Hgb A1c measurement: Goal of therapy: <6.5 Hgb A1c  Reference: American Diabetes Association: Clinical Practice Recommendations 2010, Diabetes Care, 2010, 33: (Suppl  1).   No results found for: VITAMINB12 Lab Results  Component Value Date   TSH 1.184 08/09/2015   ASSESSMENT AND PLAN 74 y.o. year old male  has a past medical history of Dyslipidemia, Endocarditis, GERD (gastroesophageal reflux disease), Heart murmur, History of stress test (11/22/2010), echocardiogram (02/10/2013), Hyperlipidemia, Hypertension, OSA on CPAP, SBE (subacute bacterial endocarditis), Sleep apnea, and Stroke (HCC). here with:  1.  Cerebrovascular disease  The patient appears stable from a neurological standpoint.  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  He says he does not need a letter at this time.  On exam, heart rate was noted to be low 42-60. He wears a fit bit, we reviewed this, ranged from 43-120.  I suggested he contact his cardiologist regarding low HR, he seems to be asymptomatic with this currently.  He is due for his annual cardiology exam.  He will follow-up at this office in 1 year or sooner if needed.  I spent 20 minutes of face-to-face and non-face-to-face time with patient.  This included previsit chart review, lab review, study review, order entry, electronic health  record documentation, patient education.  Margie Ege, AGNP-C, DNP 03/08/2020,  8:44 AM [facility] [address], [address] [place], [place] [address] [phone]

## 2020-03-13 NOTE — Progress Notes (Signed)
Cardiology Clinic Note   Patient Name: Edgar Jones Date of Encounter: 03/14/2020  Primary Care Provider:  Roger Kill, PA-C Primary Cardiologist:  Nicki Guadalajara, MD  Patient Profile    [name]. [name] 74 year old male presents today for an evaluation of his irregular heart rate.  Past Medical History    Past Medical History:  Diagnosis Date  . Dyslipidemia   . Endocarditis   . GERD (gastroesophageal reflux disease)   . Heart murmur   . History of stress test 11/22/2010   Perfusion defect in the inferior myocardial region is consistent with diaphragmatic attenuation. The remaining myocardium demonstrates normal myocardial perfusion with no evidence of ischemia or infarct. The post stress left ventricle is normal in size. The post stress EF 65%, Gobal left ventricle systolic function is normal. No significant wall motion abnormalities noted. Normal Myocardial perfus  . Hx of echocardiogram 02/10/2013   The cavity size was normal. Ststolic function was normal. The estimated EF was in the range 55%-60%. Wall motion was normal; there was no regional wall motion abnormalities. Stable MV repair & annuloplasty ring. Trace MR  . Hyperlipidemia   . Hypertension   . OSA on CPAP   . SBE (subacute bacterial endocarditis)   . Sleep apnea   . Stroke [facility])    3/10 endocarditis/embolic   Past Surgical History:  Procedure Laterality Date  . CARDIAC CATHETERIZATION  01/2009  . COLONOSCOPY    . HERNIA REPAIR     right abd  . INGUINAL HERNIA REPAIR     left side  . MITRAL VALVE REPAIR  04/2009   9mm Edwards annuloplasty ring and quadrangular resection pf P2 segment of the motral valve by Dr Dorris Fetch with normal coronary arteries.  Marland Kitchen POLYPECTOMY    . UPPER GASTROINTESTINAL ENDOSCOPY      Allergies  No Known Allergies  History of Present Illness    [name] has a PMH of essential hypertension, cerebral infarct, sinus bradycardia, mild aortic stenosis, mitral valve repair  by Dr. Dorris Fetch, OSA on CPAP, GERD, and hyperlipidemia.  Cardiac catheterization 2010 showed normal coronary arteries.  His previous carotid Doppler showed no significant abnormalities.  Previous TIA followed by neurology Dr. Anne Hahn.  Echocardiogram 10/19 showed LVEF 55-60%, G2 DD, a stable mitral valve repair mean gradient 2 mmHg and moderate LAE.   He  noticed a decreased heart rate at his neurology visit at the end of April 2021.  He then presented to his PCP on 03/13/2020.  At that time his heart rate was 46.  His blood pressure was 122/78.  He indicated that he had been staying well-hydrated and was consuming 2 glasses of wine per night.  He indicated that he had occasional lightheadedness.  His heart rate typically runs in the 50-60 bpm range.  However, as he has been monitoring he has been noticing a heart rate in the 30s, and also heart rates that go up into the 90s.  His EKG showed sinus rhythm with first-degree AV block with PACs.  He denied chest pain, palpitations, shortness of breath, syncope, headache, and vision changes at that time.  He presents the clinic today for further evaluation and states he feels well.  He did not notice that he was having lower heart rate or have presyncope or syncope.  He states that he only knew that he had low heart rate after his neurology visit April 2021.  He states he has a relative who had low heart rate and fainted  while driving.  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  He states he does not ever have low blood pressures and sometimes has high blood pressures at home.  I will ask him to continue his fluid intake, order a 14-day ZIO monitor for further evaluation and have him return to the office in 6 weeks.  I will give him a work note stating it is okay for him to drive as well.  Today he denies chest pain, shortness of breath, lower extremity edema, fatigue, palpitations, melena, hematuria, hemoptysis, diaphoresis, weakness, presyncope, syncope,  orthopnea, and PND.   Home Medications    Prior to Admission medications   Medication Sig Start Date End Date Taking? Authorizing Provider  aspirin EC 81 MG tablet Take 81 mg by mouth daily.    [provider]  atorvastatin (LIPITOR) 40 MG tablet TAKE 1 TABLET BY MOUTH  DAILY 11/15/19   Troy Sine, MD  benazepril (LOTENSIN) 40 MG tablet Take 1 tablet (40 mg total) by mouth daily. NEED OV. 12/27/19   Troy Sine, MD  Omega-3 Fatty Acids (FISH OIL) 1000 MG CAPS Take 2 capsules by mouth daily.    [provider]  omeprazole (PRILOSEC) 40 MG capsule TAKE ONE CAPSULE BY MOUTH EVERY DAY 08/21/15   Troy Sine, MD    Family History    Family History  Problem Relation Age of Onset  . Heart disease Mother   . Heart disease Father   . Arthritis Father   . Cirrhosis Brother   . Hypertension Maternal Aunt   . Colon polyps Brother   . Hypertension Brother   . Hyperlipidemia Brother   . Colon cancer Neg Hx   . Esophageal cancer Neg Hx   . Stomach cancer Neg Hx   . Rectal cancer Neg Hx    He indicated that his mother is deceased. He indicated that his father is deceased. He indicated that his sister is alive. He indicated that two of his three brothers are alive. He indicated that his maternal grandmother is deceased. He indicated that his maternal grandfather is deceased. He indicated that his paternal grandmother is deceased. He indicated that his paternal grandfather is deceased. He indicated that his maternal aunt is alive. He indicated that the status of his neg hx is unknown.  Social History    Social History   Socioeconomic History  . Marital status: Married    Spouse name: Not on file  . Number of children: 2  . Years of education: BS  . Highest education level: Not on file  Occupational History  . Occupation: Retired    Fish farm manager: DISABLED  . Occupation: [employer]  Tobacco Use  . Smoking status: Former Smoker    Packs/day: 2.00    Years: 20.00     Pack years: 40.00    Types: Cigarettes    Quit date: 04/29/1981    Years since quitting: 38.9  . Smokeless tobacco: Never Used  Substance and Sexual Activity  . Alcohol use: Yes    Alcohol/week: 14.0 standard drinks    Types: 14 Standard drinks or equivalent per week    Comment: 2 glasses wine every day  . Drug use: No  . Sexual activity: Not on file  Other Topics Concern  . Not on file  Social History Narrative  . Not on file   Social Determinants of Health   Financial Resource Strain:   . Difficulty of Paying Living Expenses:   Food Insecurity:   .  Worried About Programme researcher, broadcasting/film/video in the Last Year:   . Barista in the Last Year:   Transportation Needs:   . Freight forwarder (Medical):   Marland Kitchen Lack of Transportation (Non-Medical):   Physical Activity:   . Days of Exercise per Week:   . Minutes of Exercise per Session:   Stress:   . Feeling of Stress :   Social Connections:   . Frequency of Communication with Friends and Family:   . Frequency of Social Gatherings with Friends and Family:   . Attends Religious Services:   . Active Member of Clubs or Organizations:   . Attends Banker Meetings:   Marland Kitchen Marital Status:   Intimate Partner Violence:   . Fear of Current or Ex-Partner:   . Emotionally Abused:   Marland Kitchen Physically Abused:   . Sexually Abused:      Review of Systems    General:  No chills, fever, night sweats or weight changes.  Cardiovascular:  No chest pain, dyspnea on exertion, edema, orthopnea, palpitations, paroxysmal nocturnal dyspnea. Dermatological: No rash, lesions/masses Respiratory: No cough, dyspnea Urologic: No hematuria, dysuria Abdominal:   No nausea, vomiting, diarrhea, bright red blood per rectum, melena, or hematemesis Neurologic:  No visual changes, wkns, changes in mental status. All other systems reviewed and are otherwise negative except as noted above.  Physical Exam    VS:  BP 116/74   Pulse 67   Ht 6\' 1"  (1.854  m)   Wt 220 lb 6.4 oz (100 kg)   SpO2 97%   BMI 29.08 kg/m  , BMI Body mass index is 29.08 kg/m. GEN: Well nourished, well developed, in no acute distress. HEENT: normal. Neck: Supple, no JVD, carotid bruits, or masses. Cardiac: RRR, no murmurs, rubs, or gallops. No clubbing, cyanosis, edema.  Radials/DP/PT 2+ and equal bilaterally.  Respiratory:  Respirations regular and unlabored, clear to auscultation bilaterally. GI: Soft, nontender, nondistended, BS + x 4. MS: no deformity or atrophy. Skin: warm and dry, no rash. Neuro:  Strength and sensation are intact. Psych: Normal affect.  Accessory Clinical Findings    ECG personally reviewed by me today-sinus rhythm with first-degree AV block with premature atrial complexes 62 bpm- No acute changes  EKG 03/12/2018 Sinus bradycardia with first-degree AV block with blocked PACs 48 bpm  EKG 03/13/2020 sinus rhythm first-degree AV block with PACs  Echocardiogram 10/15/2019 IMPRESSIONS    1. Left ventricular ejection fraction, by visual estimation, is 55 to  60%. The left ventricle has normal function. There is mildly increased  left ventricular hypertrophy.  2. Left ventricular diastolic parameters are indeterminate.  3. Global right ventricle has mildly reduced systolic function.The right  ventricular size is normal. No increase in right ventricular wall  thickness.  4. Left atrial size was normal.  5. Right atrial size was normal.  6. The mitral valve has been repaired with annuloplasty ring. Trace  mitral valve regurgitation. MG 3 mmHg. MVA by continuity equation 1.7  cm^2.  7. The tricuspid valve is normal in structure. Tricuspid valve  regurgitation is mild.  8. The aortic valve was not well visualized. Aortic valve regurgitation  is not visualized. Mild aortic valve stenosis. Vmax 2.4 m/s, MG 12 mmHg,  AVA 1.9 cm^2, DI 0.51  9. The pulmonic valve was not well visualized. Pulmonic valve  regurgitation is not  visualized.  10. The inferior vena cava is normal in size with greater than 50%  respiratory variability, suggesting right  atrial pressure of 3 mmHg.  11. The tricuspid regurgitant velocity is 2.41 m/s, and with an assumed  right atrial pressure of 3 mmHg, the estimated right ventricular systolic  pressure is normal at 26.3 mmHg.   In comparison to the previous echocardiogram(s): 09/10/18 EF 55-60%. MV  mean.  Assessment & Plan   1.  Bradycardia-EKG today shows sinus rhythm with first-degree AV block with premature atrial complexes 62 bpm.  EKG from 1 year ago showed similar findings.  Blood pressure is 116/74.  Denies presyncope and syncope.  Not prescribed AV nodal blocking agents. Order 14-day ZIO monitor  Mitral valve repair-no increased dyspnea on exertion, no chest discomfort.  Performed in 2010 by Dr. Dorris Fetch.  Echocardiogram 10/15/2019 showed LVEF 55-60%, G2 DD, a stable mitral valve repair mean gradient 2 mmHg and moderate LAE. Repeat echocardiogram 12/21  Essential hypertension-BP today 116/74 Continue Benzapril Heart healthy low-sodium diet-salty 6 given Increase physical activity as tolerated Increase physical activity as tolerated  Hyperlipidemia-LDL 83 on 03/12/2018 Repeat lipid panel and LFTs Heart healthy low-sodium high-fiber diet Increase physical activity as tolerated  Obstructive sleep apnea-compliant with CPAP Continue CPAP use  Disposition: Follow-up with me in 6 weeks.   Thomasene Ripple. Ruchama Kubicek NP-C    03/14/2020, 9:31 AM [facility] [address] Office [phone] Fax [phone]

## 2020-03-14 ENCOUNTER — Encounter: Payer: Self-pay | Admitting: General Practice

## 2020-03-14 ENCOUNTER — Encounter: Payer: Self-pay | Admitting: *Deleted

## 2020-03-14 ENCOUNTER — Ambulatory Visit: Payer: Medicare Other | Admitting: General Practice

## 2020-03-14 ENCOUNTER — Other Ambulatory Visit: Payer: Self-pay

## 2020-03-14 VITALS — BP 116/74 | HR 67 | Ht 73.0 in | Wt 220.4 lb

## 2020-03-14 DIAGNOSIS — E785 Hyperlipidemia, unspecified: Secondary | ICD-10-CM

## 2020-03-14 DIAGNOSIS — Z9889 Other specified postprocedural states: Secondary | ICD-10-CM | POA: Diagnosis not present

## 2020-03-14 DIAGNOSIS — I10 Essential (primary) hypertension: Secondary | ICD-10-CM

## 2020-03-14 DIAGNOSIS — G4733 Obstructive sleep apnea (adult) (pediatric): Secondary | ICD-10-CM

## 2020-03-14 DIAGNOSIS — Z9989 Dependence on other enabling machines and devices: Secondary | ICD-10-CM

## 2020-03-14 DIAGNOSIS — R001 Bradycardia, unspecified: Secondary | ICD-10-CM

## 2020-03-14 NOTE — Patient Instructions (Signed)
Medication Instructions:  Your physician recommends that you continue on your current medications as directed. Please refer to the Current Medication list given to you today.  *If you need a refill on your cardiac medications before your next appointment, please call your pharmacy*    Testing/Procedures: ZIO XT- Long Term Monitor Instructions   Your physician has requested you wear your ZIO patch monitor__14___days.   This is a single patch monitor.  [employer] supplies one patch monitor per enrollment.  Additional stickers are not available.   Please do not apply patch if you will be having a Nuclear Stress Test, Echocardiogram, Cardiac CT, MRI, or Chest Xray during the time frame you would be wearing the monitor. The patch cannot be worn during these tests.  You cannot remove and re-apply the ZIO XT patch monitor.   Your ZIO patch monitor will be sent USPS Priority mail from [facility] directly to your home address. The monitor may also be mailed to a PO BOX if home delivery is not available.   It may take 3-5 days to receive your monitor after you have been enrolled.   Once you have received you monitor, please review enclosed instructions.  Your monitor has already been registered assigning a specific monitor serial # to you.   Applying the monitor   Shave hair from upper left chest.   Hold abrader disc by orange tab.  Rub abrader in 40 strokes over left upper chest as indicated in your monitor instructions.   Clean area with 4 enclosed alcohol pads .  Use all pads to assure are is cleaned thoroughly.  Let dry.   Apply patch as indicated in monitor instructions.  Patch will be place under collarbone on left side of chest with arrow pointing upward.   Rub patch adhesive wings for 2 minutes.Remove white label marked "1".  Remove white label marked "2".  Rub patch adhesive wings for 2 additional minutes.   While looking in a mirror, press and release button in center of patch.   A small green light will flash 3-4 times .  This will be your only indicator the monitor has been turned on.     Do not shower for the first 24 hours.  You may shower after the first 24 hours.   Press button if you feel a symptom. You will hear a small click.  Record Date, Time and Symptom in the Patient Log Book.   When you are ready to remove patch, follow instructions on last 2 pages of Patient Log Book.  Stick patch monitor onto last page of Patient Log Book.   Place Patient Log Book in [place] box.  Use locking tab on box and tape box closed securely.  The [employer] has [employer] on it.  Please place in mailbox as soon as possible.  Your physician should have your test results approximately 7 days after the monitor has been mailed back to [facility].   Call [place] at [phone] if you have questions regarding your ZIO XT patch monitor.  Call them immediately if you see an orange light blinking on your monitor.   If your monitor falls off in less than 4 days contact our Monitor department at [phone].  If your monitor becomes loose or falls off after 4 days call [employer] at [phone] for suggestions on securing your monitor.     Follow-Up: At [facility], you and your health needs are our priority.  As part  of our continuing mission to provide you with exceptional heart care, we have created designated Provider Care Teams.  These Care Teams include your primary Cardiologist (physician) and Advanced Practice Providers (APPs -  Physician Assistants and Nurse Practitioners) who all work together to provide you with the care you need, when you need it.  We recommend signing up for the patient portal called "MyChart".  Sign up information is provided on this After Visit Summary.  MyChart is used to connect with patients for Virtual Visits (Telemedicine).  Patients are able to view lab/test results, encounter notes, upcoming appointments, etc.   Non-urgent messages can be sent to your provider as well.   To learn more about what you can do with MyChart, go to [email or web address].    Your next appointment:   6-8 week(s)  The format for your next appointment:   In Person  Provider:   Edd Fabian, NP   Other Instructions  Maintain fluid intake.  Your physician has requested that you regularly monitor and record your blood pressure readings at home. Please use the same machine at the same time of day to check your readings and record them to bring to your follow-up visit.

## 2020-03-14 NOTE — Progress Notes (Signed)
Patient ID: Edgar Jones, male   DOB: 06-30-1946, 74 y.o.   MRN: [id number] 14 day ZIO XT long term holter monitor shipped to patients home.

## 2020-03-19 ENCOUNTER — Other Ambulatory Visit (INDEPENDENT_AMBULATORY_CARE_PROVIDER_SITE_OTHER): Payer: Medicare Other

## 2020-03-19 DIAGNOSIS — Z9889 Other specified postprocedural states: Secondary | ICD-10-CM

## 2020-03-19 DIAGNOSIS — R001 Bradycardia, unspecified: Secondary | ICD-10-CM

## 2020-03-19 DIAGNOSIS — I10 Essential (primary) hypertension: Secondary | ICD-10-CM

## 2020-03-19 DIAGNOSIS — E785 Hyperlipidemia, unspecified: Secondary | ICD-10-CM

## 2020-03-19 DIAGNOSIS — Z9989 Dependence on other enabling machines and devices: Secondary | ICD-10-CM

## 2020-03-19 DIAGNOSIS — G4733 Obstructive sleep apnea (adult) (pediatric): Secondary | ICD-10-CM

## 2020-03-20 ENCOUNTER — Ambulatory Visit: Payer: Medicare Other | Admitting: Medical

## 2020-03-22 ENCOUNTER — Other Ambulatory Visit: Payer: Self-pay | Admitting: Cardiovascular Disease

## 2020-04-28 NOTE — Progress Notes (Signed)
Cardiology Clinic Note   Patient Name: LAMON ROTUNDO Date of Encounter: 05/02/2020  Primary Care Provider:  Heywood Bene, PA-C Primary Cardiologist:  Shelva Majestic, MD  Patient Profile    Bufford Lope. Shaheen 74 year old male presents today for a follow-up evaluation of his irregular heart rate.  Past Medical History    Past Medical History:  Diagnosis Date  . Dyslipidemia   . Endocarditis   . GERD (gastroesophageal reflux disease)   . Heart murmur   . History of stress test 11/22/2010   Perfusion defect in the inferior myocardial region is consistent with diaphragmatic attenuation. The remaining myocardium demonstrates normal myocardial perfusion with no evidence of ischemia or infarct. The post stress left ventricle is normal in size. The post stress EF 65%, Gobal left ventricle systolic function is normal. No significant wall motion abnormalities noted. Normal Myocardial perfus  . Hx of echocardiogram 02/10/2013   The cavity size was normal. Ststolic function was normal. The estimated EF was in the range 55%-60%. Wall motion was normal; there was no regional wall motion abnormalities. Stable MV repair & annuloplasty ring. Trace MR  . Hyperlipidemia   . Hypertension   . OSA on CPAP   . SBE (subacute bacterial endocarditis)   . Sleep apnea   . Stroke [facility])    3/10 endocarditis/embolic   Past Surgical History:  Procedure Laterality Date  . CARDIAC CATHETERIZATION  01/2009  . COLONOSCOPY    . HERNIA REPAIR     right abd  . INGUINAL HERNIA REPAIR     left side  . MITRAL VALVE REPAIR  04/2009   72mm Edwards annuloplasty ring and quadrangular resection pf P2 segment of the motral valve by Dr Roxan Hockey with normal coronary arteries.  Marland Kitchen POLYPECTOMY    . UPPER GASTROINTESTINAL ENDOSCOPY      Allergies  No Known Allergies  History of Present Illness    Mr. Mori has a PMH of essential hypertension, cerebral infarct, sinus bradycardia, mild aortic stenosis, mitral  valve repair by Dr. Roxan Hockey, OSA on CPAP, GERD, and hyperlipidemia.  Cardiac catheterization 2010 showed normal coronary arteries.  His previous carotid Doppler showed no significant abnormalities.  Previous TIA followed by neurology Dr. Jannifer Franklin.  Echocardiogram 10/19 showed LVEF 55-60%, G2 DD, a stable mitral valve repair mean gradient 2 mmHg and moderate LAE.   He  noticed a decreased heart rate at his neurology visit at the end of April 2021.  He then presented to his PCP on 03/13/2020.  At that time his heart rate was 46.  His blood pressure was 122/78.  He indicated that he had been staying well-hydrated and was consuming 2 glasses of wine per night.  He indicated that he had occasional lightheadedness.  His heart rate typically runs in the 50-60 bpm range.  However, as he has been monitoring he has been noticing a heart rate in the 30s, and also heart rates that go up into the 90s.  His EKG showed sinus rhythm with first-degree AV block with PACs.  He denied chest pain, palpitations, shortness of breath, syncope, headache, and vision changes at that time.  He presented to the clinic 03/14/20 for further evaluation and stated he felt well.  He did not notice that he was having lower heart rates or notice  presyncope or syncope symptoms.  He stated that he only knew that he had low heart rate after his neurology visit April 2021.  He stated he had a relative who had low  heart rate and fainted while driving.  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  He stated he did not ever have low blood pressures and sometimes had high blood pressures at home.  I  asked him to continue his fluid intake, ordered a 14-day ZIO monitor for further evaluation and planned 6-week follow-up .  I gave him a work note stating it was okay for him to drive.  His cardiac event monitor showed predominantly sinus rhythm at a rate of 67 bpm.  He did have some low heart rates with his lowest being 39 bpm.  He also had some fast  nonsustained heart rates.  No changes were made to his medication.  He presents to the clinic today for follow-up evaluation and states he continues to feel well.  He is driving his dump truck more in the summer months.  He states this is somewhat physically demanding climbing in and out of his truck.  He does not feel changes in his heart rate however, he does notice the heart alert on his blood pressure cuff.  He states he has been considering moving to more of a Mediterranean style diet versus vegetarian.  His blood pressure is somewhat elevated initially this morning on recheck but is better controlled.  I have instructed him to keep a blood pressure log, eat a low-sodium diet, and follow-up with Dr. Tresa Endo in 6 months.  Today he denies chest pain, shortness of breath, lower extremity edema, fatigue, palpitations, melena, hematuria, hemoptysis, diaphoresis, weakness, presyncope, syncope, orthopnea, and PND.  Home Medications    Prior to Admission medications   Medication Sig Start Date End Date Taking? Authorizing Provider  aspirin EC 81 MG tablet Take 81 mg by mouth daily.    [provider]  atorvastatin (LIPITOR) 40 MG tablet TAKE 1 TABLET BY MOUTH  DAILY 11/15/19   Lennette Bihari, MD  benazepril (LOTENSIN) 40 MG tablet Take 1 tablet (40 mg total) by mouth daily. 03/23/20   Lennette Bihari, MD  Omega-3 Fatty Acids (FISH OIL) 1000 MG CAPS Take 2 capsules by mouth daily.    [provider]  omeprazole (PRILOSEC) 40 MG capsule TAKE ONE CAPSULE BY MOUTH EVERY DAY 08/21/15   Lennette Bihari, MD    Family History    Family History  Problem Relation Age of Onset  . Heart disease Mother   . Heart disease Father   . Arthritis Father   . Cirrhosis Brother   . Hypertension Maternal Aunt   . Colon polyps Brother   . Hypertension Brother   . Hyperlipidemia Brother   . Colon cancer Neg Hx   . Esophageal cancer Neg Hx   . Stomach cancer Neg Hx   . Rectal cancer Neg Hx    He  indicated that his mother is deceased. He indicated that his father is deceased. He indicated that his sister is alive. He indicated that two of his three brothers are alive. He indicated that his maternal grandmother is deceased. He indicated that his maternal grandfather is deceased. He indicated that his paternal grandmother is deceased. He indicated that his paternal grandfather is deceased. He indicated that his maternal aunt is alive. He indicated that the status of his neg hx is unknown.  Social History    Social History   Socioeconomic History  . Marital status: Married    Spouse name: Not on file  . Number of children: 2  . Years of education: BS  . Highest  education level: Not on file  Occupational History  . Occupation: Retired    Associate Professor: DISABLED  . Occupation: [employer]  Tobacco Use  . Smoking status: Former Smoker    Packs/day: 2.00    Years: 20.00    Pack years: 40.00    Types: Cigarettes    Quit date: 04/29/1981    Years since quitting: 39.0  . Smokeless tobacco: Never Used  Vaping Use  . Vaping Use: Never used  Substance and Sexual Activity  . Alcohol use: Yes    Alcohol/week: 14.0 standard drinks    Types: 14 Standard drinks or equivalent per week    Comment: 2 glasses wine every day  . Drug use: No  . Sexual activity: Not on file  Other Topics Concern  . Not on file  Social History Narrative  . Not on file   Social Determinants of Health   Financial Resource Strain:   . Difficulty of Paying Living Expenses:   Food Insecurity:   . Worried About Programme researcher, broadcasting/film/video in the Last Year:   . Barista in the Last Year:   Transportation Needs:   . Freight forwarder (Medical):   Marland Kitchen Lack of Transportation (Non-Medical):   Physical Activity:   . Days of Exercise per Week:   . Minutes of Exercise per Session:   Stress:   . Feeling of Stress :   Social Connections:   . Frequency of Communication with Friends and Family:   . Frequency of Social  Gatherings with Friends and Family:   . Attends Religious Services:   . Active Member of Clubs or Organizations:   . Attends Banker Meetings:   Marland Kitchen Marital Status:   Intimate Partner Violence:   . Fear of Current or Ex-Partner:   . Emotionally Abused:   Marland Kitchen Physically Abused:   . Sexually Abused:      Review of Systems    General:  No chills, fever, night sweats or weight changes.  Cardiovascular:  No chest pain, dyspnea on exertion, edema, orthopnea, palpitations, paroxysmal nocturnal dyspnea. Dermatological: No rash, lesions/masses Respiratory: No cough, dyspnea Urologic: No hematuria, dysuria Abdominal:   No nausea, vomiting, diarrhea, bright red blood per rectum, melena, or hematemesis Neurologic:  No visual changes, wkns, changes in mental status. All other systems reviewed and are otherwise negative except as noted above.  Physical Exam    VS:  BP 138/82   Pulse (!) 48   Ht 6\' 1"  (1.854 m)   Wt 223 lb (101.2 kg)   SpO2 98%   BMI 29.42 kg/m  , BMI Body mass index is 29.42 kg/m. GEN: Well nourished, well developed, in no acute distress. HEENT: normal. Neck: Supple, no JVD, carotid bruits, or masses. Cardiac: RRR, no murmurs, rubs, or gallops. No clubbing, cyanosis, edema.  Radials/DP/PT 2+ and equal bilaterally.  Respiratory:  Respirations regular and unlabored, clear to auscultation bilaterally. GI: Soft, nontender, nondistended, BS + x 4. MS: no deformity or atrophy. Skin: warm and dry, no rash. Neuro:  Strength and sensation are intact. Psych: Normal affect.  Accessory Clinical Findings    ECG personally reviewed by me today- none today.  EKG 03/14/2020 sinus rhythm with first-degree AV block with premature atrial complexes 62 bpm- No acute changes  EKG 03/12/2018 Sinus bradycardia with first-degree AV block with blocked PACs 48 bpm  EKG 03/13/2020 sinus rhythm first-degree AV block with PACs  Echocardiogram 10/15/2019 IMPRESSIONS    1. Left  ventricular  ejection fraction, by visual estimation, is 55 to  60%. The left ventricle has normal function. There is mildly increased  left ventricular hypertrophy.  2. Left ventricular diastolic parameters are indeterminate.  3. Global right ventricle has mildly reduced systolic function.The right  ventricular size is normal. No increase in right ventricular wall  thickness.  4. Left atrial size was normal.  5. Right atrial size was normal.  6. The mitral valve has been repaired with annuloplasty ring. Trace  mitral valve regurgitation. MG 3 mmHg. MVA by continuity equation 1.7  cm^2.  7. The tricuspid valve is normal in structure. Tricuspid valve  regurgitation is mild.  8. The aortic valve was not well visualized. Aortic valve regurgitation  is not visualized. Mild aortic valve stenosis. Vmax 2.4 m/s, MG 12 mmHg,  AVA 1.9 cm^2, DI 0.51  9. The pulmonic valve was not well visualized. Pulmonic valve  regurgitation is not visualized.  10. The inferior vena cava is normal in size with greater than 50%  respiratory variability, suggesting right atrial pressure of 3 mmHg.  11. The tricuspid regurgitant velocity is 2.41 m/s, and with an assumed  right atrial pressure of 3 mmHg, the estimated right ventricular systolic  pressure is normal at 26.3 mmHg.   In comparison to the previous echocardiogram(s): 09/10/18 EF 55-60%. MV  mean.   Assessment & Plan   1.  Bradycardia-heart rate [id number].  His cardiac event monitor showed predominantly sinus rhythm at a rate of 67 bpm.  He did have some low heart rates with his lowest being 39 bpm.  He also had some fast nonsustained heart rates.  No changes were made to his medication. Blood pressure is  138/82.  Denies presyncope and syncope.  Not prescribed AV nodal blocking agents. Continue to monitor. Maintain p.o. hydration Heart healthy low-sodium diet Increase physical activity as tolerated  Mitral valve repair-continues with no  increased dyspnea on exertion, no chest discomfort.  Performed in 2010 by Dr. Dorris Fetch.  Echocardiogram 10/15/2019 showed LVEF 55-60%, G2 DD, a stable mitral valve repair mean gradient 2 mmHg and moderate LAE. Repeat echocardiogram 12/21  Essential hypertension-BP today  138/82.  Avoid AV nodal blocking agents Continue Benzapril Heart healthy low-sodium diet-salty 6 given Increase physical activity as tolerated Increase physical activity as tolerated  Hyperlipidemia-LDL 83 on 03/12/2018  Heart healthy low-sodium high-fiber diet Increase physical activity as tolerated  Obstructive sleep apnea-compliant with CPAP Continue CPAP use  Disposition: Follow-up with Dr. Tresa Endo in 6 months.   Thomasene Ripple. Cherrell Maybee NP-C    05/02/2020, 8:33 AM [facility] [address] Office [phone] Fax [phone]

## 2020-05-02 ENCOUNTER — Ambulatory Visit (INDEPENDENT_AMBULATORY_CARE_PROVIDER_SITE_OTHER): Payer: Medicare Other | Admitting: General Practice

## 2020-05-02 ENCOUNTER — Other Ambulatory Visit: Payer: Self-pay

## 2020-05-02 ENCOUNTER — Encounter: Payer: Self-pay | Admitting: General Practice

## 2020-05-02 VITALS — BP 138/82 | HR 48 | Ht 73.0 in | Wt 223.0 lb

## 2020-05-02 DIAGNOSIS — R001 Bradycardia, unspecified: Secondary | ICD-10-CM

## 2020-05-02 DIAGNOSIS — I10 Essential (primary) hypertension: Secondary | ICD-10-CM

## 2020-05-02 DIAGNOSIS — Z9889 Other specified postprocedural states: Secondary | ICD-10-CM | POA: Diagnosis not present

## 2020-05-02 DIAGNOSIS — E785 Hyperlipidemia, unspecified: Secondary | ICD-10-CM

## 2020-05-02 DIAGNOSIS — G4733 Obstructive sleep apnea (adult) (pediatric): Secondary | ICD-10-CM

## 2020-05-02 DIAGNOSIS — Z9989 Dependence on other enabling machines and devices: Secondary | ICD-10-CM

## 2020-05-02 NOTE — Patient Instructions (Signed)
Medication Instructions:   Your physician recommends that you continue on your current medications as directed. Please refer to the Current Medication list given to you today.  *If you need a refill on your cardiac medications before your next appointment, please call your pharmacy*   Lab Work: NONE ORDERED  TODAY   If you have labs (blood work) drawn today and your tests are completely normal, you will receive your results only by: Marland Kitchen MyChart Message (if you have MyChart) OR . A paper copy in the mail If you have any lab test that is abnormal or we need to change your treatment, we will call you to review the results.   Testing/Procedures: NONE ORDERED  TODAY   Follow-Up: At [facility], you and your health needs are our priority.  As part of our continuing mission to provide you with exceptional heart care, we have created designated Provider Care Teams.  These Care Teams include your primary Cardiologist (physician) and Advanced Practice Providers (APPs -  Physician Assistants and Nurse Practitioners) who all work together to provide you with the care you need, when you need it.  We recommend signing up for the patient portal called "MyChart".  Sign up information is provided on this After Visit Summary.  MyChart is used to connect with patients for Virtual Visits (Telemedicine).  Patients are able to view lab/test results, encounter notes, upcoming appointments, etc.  Non-urgent messages can be sent to your provider as well.   To learn more about what you can do with MyChart, go to [email or web address].    Your next appointment:   6 month(s)  The format for your next appointment:   In Person  Provider:   You may see Nicki Guadalajara, MD or one of the following Advanced Practice Providers on your designated Care Team:    Azalee Course, PA-C  Micah Flesher, PA-C or   Judy Pimple, [place]    Other Instructions

## 2020-09-16 ENCOUNTER — Other Ambulatory Visit: Payer: Self-pay | Admitting: Cardiovascular Disease

## 2020-10-11 DIAGNOSIS — N529 Male erectile dysfunction, unspecified: Secondary | ICD-10-CM | POA: Insufficient documentation

## 2020-11-02 ENCOUNTER — Telehealth: Payer: Self-pay | Admitting: Cardiovascular Disease

## 2020-11-02 DIAGNOSIS — Z79899 Other long term (current) drug therapy: Secondary | ICD-10-CM

## 2020-11-02 NOTE — Telephone Encounter (Signed)
Called to speak with patient. Confirmed that he was due to see Dr. Tresa Endo for follow-up in early February and we would be happy to check his blood work in preparation for that visit. The patient states he is fasting this morning and would like to have his blood drawn today. Orders placed for Dr. Landry Dyke standard lab draw: CMET, Lipids, TSH and CBC. Will leave requisition forms with lab technician.

## 2020-11-02 NOTE — Telephone Encounter (Signed)
Patient states he has lab work done every 2 years and would like to come in to get it done, but there are no lab orders in. He would like a call back when the lab orders are put in.

## 2020-11-03 LAB — LIPID PANEL
Chol/HDL Ratio: 4.4 ratio (ref 0.0–5.0)
Cholesterol, Total: 186 mg/dL (ref 100–199)
HDL: 42 mg/dL (ref >39)
LDL Chol Calc (NIH): 117 mg/dL — ABNORMAL HIGH (ref 0–99)
Triglycerides: 152 mg/dL — ABNORMAL HIGH (ref 0–149)
VLDL Cholesterol Cal: 27 mg/dL (ref 5–40)

## 2020-11-03 LAB — CBC
Hematocrit: 45.5 % (ref 37.5–51.0)
Hemoglobin: 15 g/dL (ref 13.0–17.7)
MCH: 30.8 pg (ref 26.6–33.0)
MCHC: 33 g/dL (ref 31.5–35.7)
MCV: 93 fL (ref 79–97)
Platelets: 220 10*3/uL (ref 150–450)
RBC: 4.87 x10E6/uL (ref 4.14–5.80)
RDW: 12.6 % (ref 11.6–15.4)
WBC: 5.8 10*3/uL (ref 3.4–10.8)

## 2020-11-03 LAB — COMPREHENSIVE METABOLIC PANEL
ALT: 23 IU/L (ref 0–44)
AST: 20 IU/L (ref 0–40)
Albumin/Globulin Ratio: 1.8 (ref 1.2–2.2)
Albumin: 4.6 g/dL (ref 3.7–4.7)
Alkaline Phosphatase: 52 IU/L (ref 44–121)
BUN/Creatinine Ratio: 14 (ref 10–24)
BUN: 16 mg/dL (ref 8–27)
Bilirubin Total: 0.8 mg/dL (ref 0.0–1.2)
CO2: 22 mmol/L (ref 20–29)
Calcium: 9.4 mg/dL (ref 8.6–10.2)
Chloride: 103 mmol/L (ref 96–106)
Creatinine, Ser: 1.14 mg/dL (ref 0.76–1.27)
GFR calc Af Amer: 73 mL/min/{1.73_m2} (ref >59)
GFR calc non Af Amer: 63 mL/min/{1.73_m2} (ref >59)
Globulin, Total: 2.6 g/dL (ref 1.5–4.5)
Glucose: 90 mg/dL (ref 65–99)
Potassium: 5 mmol/L (ref 3.5–5.2)
Sodium: 139 mmol/L (ref 134–144)
Total Protein: 7.2 g/dL (ref 6.0–8.5)

## 2020-11-03 LAB — TSH: TSH: 1.41 u[IU]/mL (ref 0.450–4.500)

## 2020-11-06 ENCOUNTER — Other Ambulatory Visit: Payer: Self-pay

## 2020-11-06 DIAGNOSIS — Z79899 Other long term (current) drug therapy: Secondary | ICD-10-CM

## 2020-11-06 MED ORDER — EZETIMIBE 10 MG PO TABS: 10.0000 mg | ORAL_TABLET | Freq: Every day | ORAL | 2 refills | Status: DC

## 2020-11-06 MED ORDER — EZETIMIBE 10 MG PO TABS: 10.0000 mg | ORAL_TABLET | Freq: Every day | ORAL | 0 refills | Status: DC

## 2020-11-06 NOTE — Progress Notes (Signed)
Orders placed for Zetia 10 mg by mouth once daily and lipid profile/LFTs.

## 2020-12-14 ENCOUNTER — Encounter: Payer: Self-pay | Admitting: Cardiovascular Disease

## 2020-12-14 ENCOUNTER — Ambulatory Visit: Payer: Medicare Other | Admitting: Cardiovascular Disease

## 2020-12-14 ENCOUNTER — Other Ambulatory Visit: Payer: Self-pay

## 2020-12-14 DIAGNOSIS — G4733 Obstructive sleep apnea (adult) (pediatric): Secondary | ICD-10-CM

## 2020-12-14 DIAGNOSIS — R001 Bradycardia, unspecified: Secondary | ICD-10-CM | POA: Diagnosis not present

## 2020-12-14 DIAGNOSIS — I440 Atrioventricular block, first degree: Secondary | ICD-10-CM

## 2020-12-14 DIAGNOSIS — Z79899 Other long term (current) drug therapy: Secondary | ICD-10-CM

## 2020-12-14 DIAGNOSIS — E785 Hyperlipidemia, unspecified: Secondary | ICD-10-CM | POA: Diagnosis not present

## 2020-12-14 DIAGNOSIS — Z9889 Other specified postprocedural states: Secondary | ICD-10-CM

## 2020-12-14 DIAGNOSIS — I350 Nonrheumatic aortic (valve) stenosis: Secondary | ICD-10-CM

## 2020-12-14 DIAGNOSIS — Z9989 Dependence on other enabling machines and devices: Secondary | ICD-10-CM

## 2020-12-14 DIAGNOSIS — I10 Essential (primary) hypertension: Secondary | ICD-10-CM

## 2020-12-14 NOTE — Patient Instructions (Signed)
Medication Instructions:  Your Physician recommend you continue on your current medication as directed.    *If you need a refill on your cardiac medications before your next appointment, please call your pharmacy*   Lab Work: Your physician recommends that you return for lab work April (Lipid, CMP, Mg).  If you have labs (blood work) drawn today and your tests are completely normal, you will receive your results only by: Marland Kitchen MyChart Message (if you have MyChart) OR . A paper copy in the mail If you have any lab test that is abnormal or we need to change your treatment, we will call you to review the results.   Testing/Procedures: Your physician has requested that you have an echocardiogram. Echocardiography is a painless test that uses sound waves to create images of your heart. It provides your doctor with information about the size and shape of your heart and how well your heart's chambers and valves are working. This procedure takes approximately one hour. There are no restrictions for this procedure. [address]  Our physician has recommended that you wear an 14  DAY ZIO-PATCH monitor. The Zio patch cardiac monitor continuously records heart rhythm data for up to 14 days, this is for patients being evaluated for multiple types heart rhythms. For the first 24 hours post application, please avoid getting the Zio monitor wet in the shower or by excessive sweating during exercise. After that, feel free to carry on with regular activities. Keep soaps and lotions away from the ZIO XT Patch.   Someone from our office will call to verify address and mail monitor.     Follow-Up: At [facility], you and your health needs are our priority.  As part of our continuing mission to provide you with exceptional heart care, we have created designated Provider Care Teams.  These Care Teams include your primary Cardiologist (physician) and Advanced Practice Providers (APPs -  Physician  Assistants and Nurse Practitioners) who all work together to provide you with the care you need, when you need it.  We recommend signing up for the patient portal called "MyChart".  Sign up information is provided on this After Visit Summary.  MyChart is used to connect with patients for Virtual Visits (Telemedicine).  Patients are able to view lab/test results, encounter notes, upcoming appointments, etc.  Non-urgent messages can be sent to your provider as well.   To learn more about what you can do with MyChart, go to [email or web address].    Your next appointment:   3 month(s)  The format for your next appointment:   In Person  Provider:   Nicki Guadalajara, MD  Christena Deem- Long Term Monitor Instructions   Your physician has requested you wear your ZIO patch monitor___14____days.   This is a single patch monitor.  [employer] supplies one patch monitor per enrollment.  Additional stickers are not available.   Please do not apply patch if you will be having a Nuclear Stress Test, Echocardiogram, Cardiac CT, MRI, or Chest Xray during the time frame you would be wearing the monitor. The patch cannot be worn during these tests.  You cannot remove and re-apply the ZIO XT patch monitor.   Your ZIO patch monitor will be sent USPS Priority mail from [facility] directly to your home address. The monitor may also be mailed to a PO BOX if home delivery is not available.   It may take 3-5 days to receive your monitor after you have been enrolled.  Once you have received you monitor, please review enclosed instructions.  Your monitor has already been registered assigning a specific monitor serial # to you.   Applying the monitor   Shave hair from upper left chest.   Hold abrader disc by orange tab.  Rub abrader in 40 strokes over left upper chest as indicated in your monitor instructions.   Clean area with 4 enclosed alcohol pads .  Use all pads to assure are is cleaned thoroughly.  Let dry.    Apply patch as indicated in monitor instructions.  Patch will be place under collarbone on left side of chest with arrow pointing upward.   Rub patch adhesive wings for 2 minutes.Remove white label marked "1".  Remove white label marked "2".  Rub patch adhesive wings for 2 additional minutes.   While looking in a mirror, press and release button in center of patch.  A small green light will flash 3-4 times .  This will be your only indicator the monitor has been turned on.     Do not shower for the first 24 hours.  You may shower after the first 24 hours.   Press button if you feel a symptom. You will hear a small click.  Record Date, Time and Symptom in the Patient Log Book.   When you are ready to remove patch, follow instructions on last 2 pages of Patient Log Book.  Stick patch monitor onto last page of Patient Log Book.   Place Patient Log Book in [place] box.  Use locking tab on box and tape box closed securely.  The [employer] and [employer] has [employer] on it.  Please place in mailbox as soon as possible.  Your physician should have your test results approximately 7 days after the monitor has been mailed back to [facility].   Call [facility] Customer Care at [phone] if you have questions regarding your ZIO XT patch monitor.  Call them immediately if you see an orange light blinking on your monitor.   If your monitor falls off in less than 4 days contact our Monitor department at [phone].  If your monitor becomes loose or falls off after 4 days call [employer] at [phone] for suggestions on securing your monitor.

## 2020-12-14 NOTE — Progress Notes (Signed)
Patient ID: Edgar Jones, male   DOB: [birth year], 75 y.o.   MRN: [id number]    Primary M.D.: Dr. Juanita Jones  HPI: [name] is a 75 year old former a patient of Dr. Rollene Jones. I last saw him in May 2019. He presents for a follow-up evaluation.   [name] underwent mitral valve repair or in June 2010 with a 32 mm Edwards annuplasty plasty ring and quadrangular resection of P2 segment of the mitral valve which was done by Dr. Roxan Jones. 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 At that time he had normal coronary arteries. He has a history of obstructive sleep apnea since 2010 and has been on CPAP therapy and admits to 100% use. There is also a history of hypertension as well as hyperlipidemia in addition to GERD.  When I saw initially saw him in January 2015, he denied any episodes of chest pain. He noted mild episodes intermittently and feels at times she is unable to get a deep breath. An echo Doppler study in April 2014 confirmed normal systolic function with an ejection fraction of 55-60%. Left atrium was mildly dilated. His mitral valve repair and annuloplasty ring were stable there was no evidence for prolapse and only trivial regurgitation. He had mild TR.  Prior to initially seeing him in September 2015 he had experienced 2 transient events associated with transient right hand twitching and weakness as well as another episode of "total "vision.  He he denied any dizziness or he saw Dr. Floyde Jones.  Carotid studies did not demonstrate any significant abnormality.  An MRI of his brain did not reveal any acute intracranial abnormality.  There was expected evolution of the left occipital lobe hemorrhage and small right parietal cortex infarct, which previously had been detected in 2010.  There was evidence for mild interval increase microhemorrhages in both cerebral hemispheres.  A repeat echo Doppler study was also done in  June 2015, which showed an ejection fraction in the range of 60-65%.  There was probable to diastolic dysfunction.  There is trivial aortic regurgitation.  His mitral valve repair was intact with moderately thickened leaflets.  His atrium was dilated.  His right ventricle was mildly dilated.  It was felt by Dr. Jannifer Jones that he possibly he may have had a TIA, but this was not definitive.  The possibility of a future loop recorder was discussed if the patient had recurrent symptomatology.  The patient denies any recurrent symptoms.  He denies any awareness of any palpitations.  He denies chest pain.  He denies fever or shortness of breath.  He has a history of hypertension and has hyperlipidemia on simvastatin 40 mg.  There is a history of GERD for which she is on Prilosec.  He also has obstructive sleep apnea and continues to use CPAP with 100% compliance.   Over the past 6 months, he has continued to remain stable.  He denies chest pain.  He doesn't mild shortness of breath occasionally, which is not typically exertionally precipitated.  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  He has a history of GERD but this has been controlled with omeprazole.  He has been taking simvastatin 40 mg daily for hyperlipidemia. He has been on been  omeprazole 20 mg daily for high blood pressure.  Several years ago his triglycerides were elevated and omega-3 fatty acid was  added to his lipid lowering therapy.    On 01/17/2016 he underwent a follow-up echo Doppler study. This showed an EF of 60-65% with mild LVH. There was very mild aortic stenosis with a mean gradient of 11 mm. Aortic root measured 38 mm. He had severe left atrial dilatation and moderate right atrial dilatation..  Pulmonary pressures were normal with a PA pressure at 27 mm.  His mitral valve repair was stable without significant stenosis.   He continues to drive a [employer] and needs certified driving license.  Laboratory by his primary in January 2018: total cholesterol 202, triglycerides 225, HDL 39, and LDL 136 with a non-HDL of 163.  When I last saw him, I discontinued simvastatin, and in its place started rosuvastatin 20 mg.  He underwent a one-year follow-up echo Doppler study on 02/13/2017.  He has normal systolic function without regional wall motion abnormalities.  There was trivial aortic insufficiency.  His mitral annular ring prosthesis was present and function normally.  There was no MR.  He had severe LA dilatation and mild right a dilatation.  Subsequent  laboratory in May 2018 showed improvement in lipids, such that total cholesterol is now 149, triglycerides 130, HDL 42, and LDL 81.  Dates.  He still has been having red meat 2-3 times per week, which is usually a clean cut.  He also has fish and chicken.  LFTs were normal.    I last saw him in May 2019 and over the prior year he had continued to be stable.  He specifically denied any  chest pain or shortness of breath.  He denied any significant palpitation.  He continued to use CPAP with 100% compliance and is followed at [facility].  He continues to drive a truck.  He's asymptomatic with reference to dizziness, presyncope or syncope.  He had undergone a follow-up neurologic evaluation in April 2019 and was stable.  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  Since I last saw him, he was evaluated by Edgar Memos, NP in May and in June 2021.  He had been evaluated by his primary care physician in May 2021 and his heart rate was bradycardic in the upper 40s.  His ECG showed sinus rhythm with first-degree AV block with PACs.  He wore a Zio patch monitor which showed predominant rhythm at 67 bpm with only had some low heart rates with the lowest heart rate at 39 bpm which occurred  while sleeping at 3:53 AM.  There also were occasional very short bursts of SVT.  He saw Edgar Jones in follow-up.  His last echo Doppler study in December 2020 showed an EF of 55 to 60%.  There was mild aortic valve stenosis with a mean gradient of 12 and valve area of 1.9 cm.  Presently, [name] denies any chest pain, presyncope or syncope.  At times he admits to occasional palpitation.  He admits to decreased energy level.  He has continued to use CPAP therapy and tells me he received a new machine approximately 3 years ago.  He had been recently started on Zetia to take in addition to his atorvastatin 40 mg with an LDL cholesterol of 117 and also has been taking over-the-counter fish oil.  He has been on benazepril 40 mg daily for hypertension and takes omeprazole for  GERD.  He presents for reevaluation with me.  Past Medical History:  Diagnosis Date  . Dyslipidemia   . Endocarditis   . GERD (gastroesophageal reflux disease)   . Heart murmur   . History of stress test 11/22/2010   Perfusion defect in the inferior myocardial region is consistent with diaphragmatic attenuation. The remaining myocardium demonstrates normal myocardial perfusion with no evidence of ischemia or infarct. The post stress left ventricle is normal in size. The post stress EF 65%, Gobal left ventricle systolic function is normal. No significant wall motion abnormalities noted. Normal Myocardial perfus  . Hx of echocardiogram 02/10/2013   The cavity size was normal. Ststolic function was normal. The estimated EF was in the range 55%-60%. Wall motion was normal; there was no regional wall motion abnormalities. Stable MV repair & annuloplasty ring. Trace MR  . Hyperlipidemia   . Hypertension   . OSA on CPAP   . SBE (subacute bacterial endocarditis)   . Sleep apnea   . Stroke [employer])    3/10 endocarditis/embolic    Past Surgical History:  Procedure Laterality Date  . CARDIAC CATHETERIZATION  01/2009  . COLONOSCOPY     . HERNIA REPAIR     right abd  . INGUINAL HERNIA REPAIR     left side  . MITRAL VALVE REPAIR  04/2009   80m Edwards annuloplasty ring and quadrangular resection pf P2 segment of the motral valve by Dr HRoxan Hockeywith normal coronary arteries.  .Marland KitchenPOLYPECTOMY    . UPPER GASTROINTESTINAL ENDOSCOPY      No Known Allergies  Current Outpatient Medications  Medication Sig Dispense Refill  . amoxicillin (AMOXIL) 500 MG capsule Take 500 mg by mouth 3 (three) times daily. Take before dentist    . aspirin EC 81 MG tablet Take 81 mg by mouth daily.    .Marland Kitchenatorvastatin (LIPITOR) 40 MG tablet TAKE 1 TABLET BY MOUTH  DAILY 90 tablet 3  . benazepril (LOTENSIN) 40 MG tablet Take 1 tablet (40 mg total) by mouth daily. 90 tablet 3  . ezetimibe (ZETIA) 10 MG tablet Take 1 tablet (10 mg total) by mouth daily. 90 tablet 2  . ibuprofen (ADVIL) 600 MG tablet Take 600 mg by mouth every 6 (six) hours as needed.    . Omega-3 Fatty Acids (FISH OIL) 1000 MG CAPS Take 2 capsules by mouth daily.    .Marland Kitchenomeprazole (PRILOSEC) 40 MG capsule TAKE ONE CAPSULE BY MOUTH EVERY DAY 30 capsule 6   No current facility-administered medications for this visit.    Socially he is married has 3 children and 2 grandchildren. There is a remote tobacco history but he quit over 30 years ago. 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 He does play golf and rides a bike.  Family History  Problem Relation Age of Onset  . Heart disease Mother   . Heart disease Father   . Arthritis Father   . Cirrhosis Brother   . Hypertension Maternal Aunt   . Colon polyps Brother   . Hypertension Brother   . Hyperlipidemia Brother   . Colon cancer Neg Hx   . Esophageal cancer Neg Hx   . Stomach cancer Neg Hx   . Rectal cancer Neg Hx    He tells me that his father died with an aortic aneurysm.  His sister had pancreatic cancer.  ROS General:  Negative; No  fevers, chills, or night sweats;  HEENT: Negative; No changes in vision or hearing, sinus congestion, difficulty swallowing Pulmonary: Negative; No cough, wheezing, shortness of breath, hemoptysis Cardiovascular: see HPI GI: Negative; No nausea, vomiting, diarrhea, or abdominal pain GU: Negative; No dysuria, hematuria, or difficulty voiding Musculoskeletal: Negative; no myalgias, joint pain, or weakness Hematologic/Oncology: Negative; no easy bruising, bleeding Endocrine: Negative; no heat/cold intolerance; no diabetes Neuro: See history of present illness; no changes in balance, headaches Skin: Negative; No rashes or skin lesions Psychiatric: Negative; No behavioral problems, depression Sleep: Positive for sleep apnea on CPAP therapy;  He admits to 100% compliance; No snoring, daytime sleepiness, hypersomnolence, bruxism, restless legs, hypnogognic hallucinations, no cataplexy Other comprehensive 14 point system review is negative.   PE BP (!) 154/80 (BP Location: Left Arm)   Pulse (!) 46   Ht '6\' 3"'  (1.905 m)   Wt 225 lb 3.2 oz (102.2 kg)   BMI 28.15 kg/m    Repeat blood pressure by me was 140/80 supine and 142/78 standing  Wt Readings from Last 3 Encounters:  12/14/20 225 lb 3.2 oz (102.2 kg)  05/02/20 223 lb (101.2 kg)  03/14/20 220 lb 6.4 oz (100 kg)    General: Alert, oriented, no distress.  Skin: normal turgor, no rashes, warm and dry HEENT: Normocephalic, atraumatic. Pupils equal round and reactive to light; sclera anicteric; extraocular muscles intact;  Nose without nasal septal hypertrophy Mouth/Parynx benign; Mallinpatti scale 3 Neck: No JVD, no carotid bruits; normal carotid upstroke Lungs: clear to ausculatation and percussion; no wheezing or rales Chest wall: without tenderness to palpitation Heart: PMI not displaced, RRR, s1 s2 normal, 7-1/6 systolic murmur, no diastolic murmur, no rubs, gallops, thrills, or heaves Abdomen: soft, nontender; no  hepatosplenomehaly, BS+; abdominal aorta nontender and not dilated by palpation. Back: no CVA tenderness Pulses 2+ Musculoskeletal: full range of motion, normal strength, no joint deformities Extremities: no clubbing cyanosis or edema, Homan's sign negative  Neurologic: grossly nonfocal; Cranial nerves grossly wnl Psychologic: Normal mood and affect   ECG (independently read by me): Sinus rhythm with marked sinus arrhythmia, PACs, ventricular rate averaging 46 bpm. First-degree AV block with a PR interval at 220 ms. Inferolateral T wave abnormality.  May 2017 ECG (independently read by me): Sinus bradycardia at 48 bpm with mild sinus arrhythmia and first-degree AV block.  Isolated PAC.  PR interval 232 ms.  QTc interval 394 ms.  May 2018 ECG (independently read by me): Normal sinus rhythm at 60 bpm.  First degree AV block.  PAC.  PR interval 214 ms per QTc interval 394 ms.  March 2018 ECG (independently read by me): Normal sinus rhythm at 63 bpm with sinus arrhythmia, first-degree AV block, nonspecific T changes.  March 2017 ECG (independently read by me):  Sinus bradycardia 50 bpm with first-degree block.  No ectopy. Mild T wave abnormality inferiorly.  September 2016 ECG (independently read by me): Sinus bradycardia 53 bpm.  First-degree AV block with a PR interval at 224 ms.  ECG (independently read by me): Normal sinus rhythm at 63 bpm.  Occasional PVC.  First-degree AV block with a PR interval at 212.  September 2015 ECG (independently read by me): Sinus bradycardia 51 beats per minute.  Borderline first-degree V. block with a PR interval of 208 ms.  QTc interval 411 milliseconds  November 23, 2013 ECG (independently read by me): Sinus bradycardia with very mild first-degree block with a PR interval of 218 ms. No significant ST changes.  LABS:  BMP Latest Ref Rng & Units 11/02/2020 03/12/2018 03/28/2017  Glucose 65 - 99 mg/dL 90 89 90  BUN 8 - 27 mg/dL '16 18 16  ' Creatinine 0.76 -  1.27 mg/dL 1.14 1.10 1.10  BUN/Creat Ratio 10 - '24 14 16 ' -  Sodium 134 - 144 mmol/L 139 142 140  Potassium 3.5 - 5.2 mmol/L 5.0 5.0 5.0  Chloride 96 - 106 mmol/L 103 104 107  CO2 20 - 29 mmol/L '22 24 26  ' Calcium 8.6 - 10.2 mg/dL 9.4 9.8 9.2   Hepatic Function Latest Ref Rng & Units 11/02/2020 03/12/2018 03/28/2017  Total Protein 6.0 - 8.5 g/dL 7.2 6.8 6.9  Albumin 3.7 - 4.7 g/dL 4.6 4.4 4.5  AST 0 - 40 IU/L '20 17 22  ' ALT 0 - 44 IU/L '23 24 23  ' Alk Phosphatase 44 - 121 IU/L 52 44 38(L)  Total Bilirubin 0.0 - 1.2 mg/dL 0.8 0.5 0.7   CBC Latest Ref Rng & Units 11/02/2020 08/09/2015 02/06/2015  WBC 3.4 - 10.8 x10E3/uL 5.8 4.4 4.7  Hemoglobin 13.0 - 17.7 g/dL 15.0 14.0 14.5  Hematocrit 37.5 - 51.0 % 45.5 43.1 44.4  Platelets 150 - 450 x10E3/uL 220 319 305   Lab Results  Component Value Date   MCV 93 11/02/2020   MCV 92.3 08/09/2015   MCV 92.7 02/06/2015   Lab Results  Component Value Date   TSH 1.410 11/02/2020   Lab Results  Component Value Date   HGBA1C  05/08/2009    4.6 (NOTE) The ADA recommends the following therapeutic goal for glycemic control related to Hgb A1c measurement: Goal of therapy: <6.5 Hgb A1c  Reference: American Diabetes Association: Clinical Practice Recommendations 2010, Diabetes Care, 2010, 33: (Suppl  1).   BNP No results found for: PROBNP  Lipid Panel     Component Value Date/Time   CHOL 186 11/02/2020 1110   TRIG 152 (H) 11/02/2020 1110   HDL 42 11/02/2020 1110   CHOLHDL 4.4 11/02/2020 1110   CHOLHDL 3.5 03/28/2017 0925   VLDL 26 03/28/2017 0925   LDLCALC 117 (H) 11/02/2020 1110     RADIOLOGY: No results found.  IMPRESSION:  1. Sinus bradycardia   2. Essential hypertension   3. Aortic valve stenosis, etiology of cardiac valve disease unspecified   4. Hyperlipidemia LDL goal <100   5. S/P mitral valve repair   6. OSA on CPAP   7. First degree AV block   8. Medication management    ASSESSMENT AND PLAN: [name] is a 75   year-old gentleman who has a remote history of SBE and mitral valve endocarditis for which he required mitral valve repair surgery with quadrangular resection of his P2 segment of the mitral valve at which time a 32 mm Edwards annuloplasty ring was inserted in 2010.  An echo Doppler study in March 2017 showed stable mitral valve repair without stenosis or regurgitation.  He had normal systolic function with mild LVH.  There was evidence for mild aortic stenosis with a mean gradient of 11.  There was evidence for biatrial enlargement.  When I saw him in March 2018, he was hypertensive and I further titrated benazapril from 20 ro 40 mg. An echo Doppler study from 02/13/2017 continued to show a ejection fraction at 55-60%.  There was trivial aortic insufficiency.  His mitral valve annuloplasty ring was present and function normally.  There was no MR.  He had biatrial enlargement.  Since I last saw him in May 2019  he wore a 2-week monitor in May 2021 after being found to have a slow heart rate.  His predominant rhythm was sinus rhythm.  The slowest heart rate was sinus bradycardia at 39 which occurred while sleeping and he was also noticed to have short bursts of SVT with the fastest interval lasting 5 beats with a maximum rate of 164 and the longest interval lasting 18 beats with an average rate at 118.  Presently, he has noticed a decline in his energy.  His blood pressure is stable with systolic blood pressure around 140 without orthostatic change.  I have recommended a follow-up echo Doppler study to reassess his systolic and diastolic function as well as aortic valve disease.  His ECG today shows sinus rhythm with marked sinus arrhythmia, PVCs, first-degree heart block and a ventricular rate in the 40s.  I have suggested he wear a 2-week event monitor.  I am checking a chemistry profile, magnesium level.  He is now on Zetia in addition to atorvastatin for hyperlipidemia and fasting lipid studies will be obtained.   He continues to use CPAP and receives a new machine 3 years ago.  He admits to excellent compliance.  I will see him back in the office in several months for follow-up evaluation or sooner as needed.   Troy Sine, MD, [facility] 12/16/2020 3:46 PM

## 2020-12-15 ENCOUNTER — Ambulatory Visit (INDEPENDENT_AMBULATORY_CARE_PROVIDER_SITE_OTHER): Payer: Medicare Other

## 2020-12-15 DIAGNOSIS — R001 Bradycardia, unspecified: Secondary | ICD-10-CM

## 2020-12-16 ENCOUNTER — Encounter: Payer: Self-pay | Admitting: Cardiovascular Disease

## 2020-12-18 ENCOUNTER — Other Ambulatory Visit: Payer: Self-pay | Admitting: Cardiovascular Disease

## 2020-12-21 DIAGNOSIS — R001 Bradycardia, unspecified: Secondary | ICD-10-CM | POA: Diagnosis not present

## 2021-01-11 ENCOUNTER — Ambulatory Visit (HOSPITAL_COMMUNITY): Payer: Medicare Other | Attending: Cardiology

## 2021-01-11 ENCOUNTER — Other Ambulatory Visit: Payer: Self-pay

## 2021-01-11 DIAGNOSIS — I350 Nonrheumatic aortic (valve) stenosis: Secondary | ICD-10-CM | POA: Diagnosis present

## 2021-01-11 LAB — ECHOCARDIOGRAM COMPLETE
AR max vel: 2.22 cm2
AV Area VTI: 2.28 cm2
AV Area mean vel: 2.29 cm2
AV Mean grad: 5 mmHg
AV Peak grad: 8.9 mmHg
Ao pk vel: 1.49 m/s
Area-P 1/2: 2.53 cm2
MV VTI: 1.16 cm2
S' Lateral: 3.7 cm

## 2021-03-07 ENCOUNTER — Other Ambulatory Visit: Payer: Self-pay | Admitting: Cardiovascular Disease

## 2021-03-08 ENCOUNTER — Ambulatory Visit: Payer: Medicare Other | Admitting: Neurology

## 2021-03-08 ENCOUNTER — Encounter: Payer: Self-pay | Admitting: Neurology

## 2021-03-08 VITALS — BP 150/90 | HR 59 | Ht 75.0 in | Wt 229.0 lb

## 2021-03-08 DIAGNOSIS — I679 Cerebrovascular disease, unspecified: Secondary | ICD-10-CM | POA: Diagnosis not present

## 2021-03-08 NOTE — Progress Notes (Signed)
I have read the note, and I agree with the clinical assessment and plan.  Stormie Ventola K Kimberli Winne   

## 2021-03-08 NOTE — Progress Notes (Signed)
PATIENT: Edgar Jones DOB: 1946/03/18  REASON FOR VISIT: follow up HISTORY FROM: patient  HISTORY OF PRESENT ILLNESS: Today 03/08/21  Edgar Jones is a 75 year old male with history of cerebrovascular disease.  In 2015, he had 2 TIA type events, has been on blood pressure medication, aspirin, and statin drug since that time.  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  Follows with cardiology, wore a heart monitor, showed 3 episodes of Nonsustained VTAC, 22 episodes of SVT, reported given metoprolol as needed for palpitations, he has follow-up in May. 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 He drives a dump truck, works for [employer], full time hours as long as good weather. Using CPAP. BP slightly up today, just took medications before he got here.  Here today for evaluation unaccompanied.  He denies any changes to the vision, numbness or weakness to the arms or legs, or changes to the balance.  Update 03/08/20 SS: Edgar Jones is a 75 year old male with a history of cerebrovascular disease.  In 2015, he had 2 TIA type events, he has been on blood pressure medication, aspirin, and statin drug since that time.  He works as a [employer] architecttruck driver, he requires Futures traderannual evaluation for CDL.  He has not had any further TIA events.  He denies any changes to the vision, numbness or weakness to the arms or legs, changes to the balance, chest pain, palpitations.  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  He feels he is overall doing well.  He says he may have some shortness of breath, at the end of a long workday, otherwise no problems.  He presents today for annual evaluation.   Dr. Tresa EndoKelly,  HISTORY 03/08/2019 Dr. Anne HahnWillis: Edgar Jones is a 75 year old right-handed white male with a history of cerebrovascular disease.  In 2015 he had two TIA type events, he has been on blood pressure medications, aspirin, and a statin drug since that time, he has not had any recurrence.  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  The patient reports no new symptoms over the last year.  He denies any headache, vision changes, or numbness or weakness of the face, arms, legs.  He denies any speech or swallowing problems and he denies any balance issues.  He has not reported any new medical issues that have come up since last seen.  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  The patient does report occasional episodes of shortness of breath, he denies any palpitations of the heart or severe fatigue with these events.  REVIEW OF SYSTEMS: Out of a complete 14 system review of symptoms, the patient complains only of the following symptoms, and all other reviewed systems are negative.  N/A  ALLERGIES: No Known Allergies  HOME MEDICATIONS: Outpatient Medications Prior to Visit  Medication Sig Dispense Refill  . amoxicillin (AMOXIL) 500 MG capsule Take 500 mg by mouth 3 (three) times daily. Take before dentist    . aspirin EC 81 MG tablet Take 81 mg by mouth daily.    Marland Kitchen. atorvastatin (LIPITOR) 40 MG tablet TAKE 1 TABLET BY MOUTH  DAILY 90 tablet 3  . benazepril (LOTENSIN) 40 MG tablet Take 1 tablet (40 mg total) by mouth daily. 90 tablet 3  . ezetimibe (ZETIA) 10 MG tablet TAKE 1 TABLET BY MOUTH EVERY DAY 90 tablet 3  . ibuprofen (ADVIL) 600 MG tablet  Take 600 mg by mouth every 6 (six) hours as needed.    . Omega-3 Fatty Acids (FISH OIL) 1000 MG CAPS Take 2 capsules by mouth daily.    Marland Kitchen omeprazole (PRILOSEC) 40 MG capsule TAKE ONE CAPSULE BY MOUTH EVERY DAY 30 capsule 6   No facility-administered medications prior to visit.    PAST MEDICAL HISTORY: Past Medical History:  Diagnosis Date  . Dyslipidemia   . Endocarditis   . GERD (gastroesophageal reflux disease)   . Heart murmur   . History of stress test 11/22/2010   Perfusion defect in the inferior myocardial region is consistent with diaphragmatic attenuation. The remaining myocardium  demonstrates normal myocardial perfusion with no evidence of ischemia or infarct. The post stress left ventricle is normal in size. The post stress EF 65%, Gobal left ventricle systolic function is normal. No significant wall motion abnormalities noted. Normal Myocardial perfus  . Hx of echocardiogram 02/10/2013   The cavity size was normal. Ststolic function was normal. The estimated EF was in the range 55%-60%. Wall motion was normal; there was no regional wall motion abnormalities. Stable MV repair & annuloplasty ring. Trace MR  . Hyperlipidemia   . Hypertension   . OSA on CPAP   . SBE (subacute bacterial endocarditis)   . Sleep apnea   . Stroke [facility])    3/10 endocarditis/embolic    PAST SURGICAL HISTORY: Past Surgical History:  Procedure Laterality Date  . CARDIAC CATHETERIZATION  01/2009  . COLONOSCOPY    . HERNIA REPAIR     right abd  . INGUINAL HERNIA REPAIR     left side  . 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  Marland Kitchen POLYPECTOMY    . UPPER GASTROINTESTINAL ENDOSCOPY      FAMILY HISTORY: Family History  Problem Relation Age of Onset  . Heart disease Mother   . Heart disease Father   . Arthritis Father   . Cirrhosis Brother   . Hypertension Maternal Aunt   . Colon polyps Brother   . Hypertension Brother   . Hyperlipidemia Brother   . Colon cancer Neg Hx   . Esophageal cancer Neg Hx   . Stomach cancer Neg Hx   . Rectal cancer Neg Hx     SOCIAL HISTORY: Social History   Socioeconomic History  . Marital status: Married    Spouse name: Not on file  . Number of children: 2  . Years of education: BS  . Highest education level: Not on file  Occupational History  . Occupation: Retired    Associate Professor: DISABLED  . Occupation: [employer]  Tobacco Use  . Smoking status: Former Smoker    Packs/day: 2.00    Years: 20.00    Pack years:  40.00    Types: Cigarettes    Quit date: 04/29/1981    Years since quitting: 39.8  . Smokeless tobacco: Never Used  Vaping Use  . Vaping Use: Never used  Substance and Sexual Activity  . Alcohol use: Yes    Alcohol/week: 14.0 standard drinks    Types: 14 Standard drinks or equivalent per week    Comment: 2 glasses wine every day  . Drug use: No  . Sexual activity: Not on file  Other Topics Concern  . Not on file  Social History Narrative  . Not on file   Social Determinants of Health  Financial Resource Strain: Not on file  Food Insecurity: Not on file  Transportation Needs: Not on file  Physical Activity: Not on file  Stress: Not on file  Social Connections: Not on file  Intimate Partner Violence: Not on file   PHYSICAL EXAM  Vitals:   03/08/21 0738 03/08/21 0753  BP: (!) 149/100 (!) 150/90  Pulse: (!) 59   Weight: 229 lb (103.9 kg)   Height: 6\' 3"  (1.905 m)    Body mass index is 28.62 kg/m.  Generalized: Well developed, in no acute distress   Neurological examination  Mentation: Alert oriented to time, place, history taking. Follows all commands speech and language fluent Cranial nerve II-XII: Pupils were equal round reactive to light. Extraocular movements were full, visual field were full on confrontational test. Facial sensation and strength were normal. Head turning and shoulder shrug  were normal and symmetric. Motor: The motor testing reveals 5 over 5 strength of all 4 extremities. Good symmetric motor tone is noted throughout.  Sensory: Sensory testing is intact to soft touch on all 4 extremities. No evidence of extinction is noted.  Coordination: Cerebellar testing reveals good finger-nose-finger and heel-to-shin bilaterally.  Gait and station: Gait is normal. Tandem gait is normal. Romberg is negative. No drift is seen.  Reflexes: Deep tendon reflexes are symmetric and normal bilaterally.   DIAGNOSTIC DATA (LABS, IMAGING, TESTING) - I reviewed patient  records, labs, notes, testing and imaging myself where available.  Lab Results  Component Value Date   WBC 5.8 11/02/2020   HGB 15.0 11/02/2020   HCT 45.5 11/02/2020   MCV 93 11/02/2020   PLT 220 11/02/2020      Component Value Date/Time   NA 139 11/02/2020 1110   K 5.0 11/02/2020 1110   CL 103 11/02/2020 1110   CO2 22 11/02/2020 1110   GLUCOSE 90 11/02/2020 1110   GLUCOSE 90 03/28/2017 0925   BUN 16 11/02/2020 1110   CREATININE 1.14 11/02/2020 1110   CREATININE 1.10 03/28/2017 0925   CALCIUM 9.4 11/02/2020 1110   PROT 7.2 11/02/2020 1110   ALBUMIN 4.6 11/02/2020 1110   AST 20 11/02/2020 1110   ALT 23 11/02/2020 1110   ALKPHOS 52 11/02/2020 1110   BILITOT 0.8 11/02/2020 1110   GFRNONAA 63 11/02/2020 1110   GFRAA 73 11/02/2020 1110   Lab Results  Component Value Date   CHOL 186 11/02/2020   HDL 42 11/02/2020   LDLCALC 117 (H) 11/02/2020   TRIG 152 (H) 11/02/2020   CHOLHDL 4.4 11/02/2020   Lab Results  Component Value Date   HGBA1C  05/08/2009    4.6 (NOTE) The ADA recommends the following therapeutic goal for glycemic control related to Hgb A1c measurement: Goal of therapy: <6.5 Hgb A1c  Reference: American Diabetes Association: Clinical Practice Recommendations 2010, Diabetes Care, 2010, 33: (Suppl  1).   No results found for: VITAMINB12 Lab Results  Component Value Date   TSH 1.410 11/02/2020   ASSESSMENT AND PLAN 75 y.o. year old male  has a past medical history of Dyslipidemia, Endocarditis, GERD (gastroesophageal reflux disease), Heart murmur, History of stress test (11/22/2010), echocardiogram (02/10/2013), Hyperlipidemia, Hypertension, OSA on CPAP, SBE (subacute bacterial endocarditis), Sleep apnea, and Stroke (HCC). here with:  1.  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  He remains on aspirin, Lipitor, Lotensin, Zetia; recommend he keep a blood pressure log at home -He will follow-up at our office in 1 year or sooner if needed  Otila Kluver, DNP 03/08/2021, 8:05 AM [facility] [address] [place], [place] [address] [phone]

## 2021-03-08 NOTE — Patient Instructions (Signed)
Keep check of BP at home, follow-up with cardiology about results of heart monitor study  See you back in 1 year

## 2021-03-13 ENCOUNTER — Telehealth: Payer: Self-pay

## 2021-03-13 NOTE — Telephone Encounter (Signed)
-----   Message from Lennette Bihari, MD sent at 02/27/2021  2:11 PM EDT ----- The patient wore a ZIO patch monitor from February 10 through January 04, 2021, totaling 13 days and 3 hours.  The predominant rhythm was sinus rhythm with an average rate at 63 bpm.  The slowest heart rate was sinus bradycardia at 30 bpm which occurred on December 30, 2020 at 1:58 AM.  The maximal heart rate was 154 bpm.  The patient had 3 episodes of nonsustained ventricular tachycardia with the fastest interval of 4 beats at a maximum rate at 154 and the longest lasted 4 beats with an average rate at 123 bpm.  There were 22 episodes of SVT with the fastest interval lasting 7 beats with a maximum rate of 144 and the longest interval lasting 16 beats with an average rate of 103.  There were frequent PACs and occasional atrial couplets.  There were occasional isolated PVCs with rare couplets and triplets.  Ventricular bigeminy was demonstrated.  Is history of beta-blocker will avoid daily beta-blocker therapy.  However given prescription for metoprolol tartrate 25 mg to take 1/2-1 p.o. as needed palpitations

## 2021-03-13 NOTE — Telephone Encounter (Signed)
Pt updated with monitor results along with MD's recommendations. Pt state he will be hard for him to take Metoprolol tartrate PRN as he's never aware of episodes and can't feel them.  Will forward to MD for recommendations.

## 2021-03-14 NOTE — Telephone Encounter (Signed)
Left message to call back  

## 2021-03-14 NOTE — Telephone Encounter (Signed)
With his slow heart rate down to the 30s I am hesitant to initiate daily beta-blocker.  He probably should fill the as needed metoprolol and hopefully he will not need it but if he did have a sustained run of palpitations with increased heart rate if he is aware of it he can take 12.5 mg to 25 mg on a as needed basis

## 2021-03-22 LAB — COMPREHENSIVE METABOLIC PANEL
ALT: 24 IU/L (ref 0–44)
AST: 25 IU/L (ref 0–40)
Albumin/Globulin Ratio: 2 (ref 1.2–2.2)
Albumin: 4.3 g/dL (ref 3.7–4.7)
Alkaline Phosphatase: 47 IU/L (ref 44–121)
BUN/Creatinine Ratio: 15 (ref 10–24)
BUN: 17 mg/dL (ref 8–27)
Bilirubin Total: 0.4 mg/dL (ref 0.0–1.2)
CO2: 21 mmol/L (ref 20–29)
Calcium: 8.5 mg/dL — ABNORMAL LOW (ref 8.6–10.2)
Chloride: 104 mmol/L (ref 96–106)
Creatinine, Ser: 1.15 mg/dL (ref 0.76–1.27)
Globulin, Total: 2.1 g/dL (ref 1.5–4.5)
Glucose: 84 mg/dL (ref 65–99)
Potassium: 4.2 mmol/L (ref 3.5–5.2)
Sodium: 138 mmol/L (ref 134–144)
Total Protein: 6.4 g/dL (ref 6.0–8.5)
eGFR: 66 mL/min/{1.73_m2} (ref >59)

## 2021-03-22 LAB — LIPID PANEL
Chol/HDL Ratio: 2.7 ratio (ref 0.0–5.0)
Cholesterol, Total: 109 mg/dL (ref 100–199)
HDL: 40 mg/dL (ref >39)
LDL Chol Calc (NIH): 53 mg/dL (ref 0–99)
Triglycerides: 81 mg/dL (ref 0–149)
VLDL Cholesterol Cal: 16 mg/dL (ref 5–40)

## 2021-03-22 LAB — MAGNESIUM: Magnesium: 2.1 mg/dL (ref 1.6–2.3)

## 2021-03-22 MED ORDER — METOPROLOL TARTRATE 25 MG PO TABS: ORAL_TABLET | ORAL | 3 refills | Status: DC

## 2021-03-22 NOTE — Telephone Encounter (Signed)
Pt updated with MD's recommendations and verbalized understanding. New orders placed.  

## 2021-03-27 ENCOUNTER — Other Ambulatory Visit: Payer: Self-pay

## 2021-03-27 ENCOUNTER — Encounter: Payer: Self-pay | Admitting: Cardiovascular Disease

## 2021-03-27 ENCOUNTER — Ambulatory Visit: Payer: Medicare Other | Admitting: Cardiovascular Disease

## 2021-03-27 VITALS — BP 142/86 | HR 48 | Ht 75.0 in | Wt 220.0 lb

## 2021-03-27 DIAGNOSIS — I10 Essential (primary) hypertension: Secondary | ICD-10-CM

## 2021-03-27 DIAGNOSIS — R001 Bradycardia, unspecified: Secondary | ICD-10-CM | POA: Diagnosis not present

## 2021-03-27 DIAGNOSIS — I471 Supraventricular tachycardia, unspecified: Secondary | ICD-10-CM

## 2021-03-27 DIAGNOSIS — I440 Atrioventricular block, first degree: Secondary | ICD-10-CM

## 2021-03-27 DIAGNOSIS — Z9989 Dependence on other enabling machines and devices: Secondary | ICD-10-CM

## 2021-03-27 DIAGNOSIS — R9431 Abnormal electrocardiogram [ECG] [EKG]: Secondary | ICD-10-CM | POA: Diagnosis not present

## 2021-03-27 DIAGNOSIS — Z9889 Other specified postprocedural states: Secondary | ICD-10-CM

## 2021-03-27 DIAGNOSIS — G4733 Obstructive sleep apnea (adult) (pediatric): Secondary | ICD-10-CM

## 2021-03-27 NOTE — Patient Instructions (Addendum)
Medication Instructions:  Your physician recommends that you continue on your current medications as directed. Please refer to the Current Medication list given to you today.  *If you need a refill on your cardiac medications before your next appointment, please call your pharmacy*   Lab Work: None ordered   Testing/Procedures: None ordered   Follow-Up: At [facility], you and your health needs are our priority.  As part of our continuing mission to provide you with exceptional heart care, we have created designated Provider Care Teams.  These Care Teams include your primary Cardiologist (physician) and Advanced Practice Providers (APPs -  Physician Assistants and Nurse Practitioners) who all work together to provide you with the care you need, when you need it.  We recommend signing up for the patient portal called "MyChart".  Sign up information is provided on this After Visit Summary.  MyChart is used to connect with patients for Virtual Visits (Telemedicine).  Patients are able to view lab/test results, encounter notes, upcoming appointments, etc.  Non-urgent messages can be sent to your provider as well.   To learn more about what you can do with MyChart, go to [email or web address].    Your next appointment:   6 month(s)  The format for your next appointment:   In Person  Provider:   Nicki Guadalajara, MD   Other Instructions You have been referred to EP clinic at [facility].  Changes will be made to your CPAP machine.

## 2021-03-27 NOTE — Progress Notes (Signed)
Patient ID: [name], male   DOB: [birth year], 75 y.o.   MRN: [id number]    Primary M.D.: Dr. Juanita Jones  HPI: [name] is a 75 year old former a patient of Dr. Rollene Jones. He presents for a 3 month follow-up evaluation.   [name] underwent mitral valve repair or in June 2010 with a 32 mm Edwards annuplasty plasty ring and quadrangular resection of P2 segment of the mitral valve which was done by Dr. Roxan Jones. 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 At that time he had normal coronary arteries. He has a history of obstructive sleep apnea since 2010 and has been on CPAP therapy and admits to 100% use. There is also a history of hypertension as well as hyperlipidemia in addition to GERD.  When I saw initially saw him in January 2015, he denied any episodes of chest pain. He noted mild episodes intermittently and feels at times she is unable to get a deep breath. An echo Doppler study in April 2014 confirmed normal systolic function with an ejection fraction of 55-60%. Left atrium was mildly dilated. His mitral valve repair and annuloplasty ring were stable there was no evidence for prolapse and only trivial regurgitation. He had mild TR.  Prior to initially seeing him in September 2015 he had experienced 2 transient events associated with transient right hand twitching and weakness as well as another episode of "total "vision.  He he denied any dizziness or he saw Dr. Floyde Jones.  Carotid studies did not demonstrate any significant abnormality.  An MRI of his brain did not reveal any acute intracranial abnormality.  There was expected evolution of the left occipital lobe hemorrhage and small right parietal cortex infarct, which previously had been detected in 2010.  There was evidence for mild interval increase microhemorrhages in both cerebral hemispheres.  A repeat echo Doppler study was also done in June 2015, which  showed an ejection fraction in the range of 60-65%.  There was probable to diastolic dysfunction.  There is trivial aortic regurgitation.  His mitral valve repair was intact with moderately thickened leaflets.  His atrium was dilated.  His right ventricle was mildly dilated.  It was felt by Dr. Jannifer Jones that he possibly he may have had a TIA, but this was not definitive.  The possibility of a future loop recorder was discussed if the patient had recurrent symptomatology.  The patient denies any recurrent symptoms.  He denies any awareness of any palpitations.  He denies chest pain.  He denies fever or shortness of breath.  He has a history of hypertension and has hyperlipidemia on simvastatin 40 mg.  There is a history of GERD for which she is on Prilosec.  He also has obstructive sleep apnea and continues to use CPAP with 100% compliance.   Over the past 6 months, he has continued to remain stable.  He denies chest pain.  He doesn't mild shortness of breath occasionally, which is not typically exertionally precipitated.  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  He has a history of GERD but this has been controlled with omeprazole.  He has been taking simvastatin 40 mg daily for hyperlipidemia. He has been on been  omeprazole 20 mg daily for high blood pressure.  Several years ago his triglycerides were elevated and omega-3 fatty acid was added to his lipid lowering  therapy.    On 01/17/2016 he underwent a follow-up echo Doppler study. This showed an EF of 60-65% with mild LVH. There was very mild aortic stenosis with a mean gradient of 11 mm. Aortic root measured 38 mm. He had severe left atrial dilatation and moderate right atrial dilatation..  Pulmonary pressures were normal with a PA pressure at 27 mm.  His mitral valve repair was stable without significant stenosis.   He continues to drive a [name] and needs certified driving license. Laboratory by his  primary in January 2018: total cholesterol 202, triglycerides 225, HDL 39, and LDL 136 with a non-HDL of 163.  When I last saw him, I discontinued simvastatin, and in its place started rosuvastatin 20 mg.  He underwent a one-year follow-up echo Doppler study on 02/13/2017.  He has normal systolic function without regional wall motion abnormalities.  There was trivial aortic insufficiency.  His mitral annular ring prosthesis was present and function normally.  There was no MR.  He had severe LA dilatation and mild right a dilatation.  Subsequent  laboratory in May 2018 showed improvement in lipids, such that total cholesterol is now 149, triglycerides 130, HDL 42, and LDL 81.  Dates.  He still has been having red meat 2-3 times per week, which is usually a clean cut.  He also has fish and chicken.  LFTs were normal.    I last saw him in May 2019 and over the prior year he had continued to be stable.  He specifically denied any  chest pain or shortness of breath.  He denied any significant palpitation.  He continued to use CPAP with 100% compliance and is followed at [facility].  He continues to drive a truck.  He's asymptomatic with reference to dizziness, presyncope or syncope.  He had undergone a follow-up neurologic evaluation in April 2019 and was stable.  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  He was evaluated by Edgar Memos, NP in May and in June 2021.  He had been evaluated by his primary care physician in May 2021 and his heart rate was bradycardic in the upper 40s.  His ECG showed sinus rhythm with first-degree AV block with PACs.  He wore a Zio patch monitor which showed predominant rhythm at 67 bpm with only had some low heart rates with the lowest heart rate at 39 bpm which occurred while sleeping at 3:53 AM.  There also  were occasional very short bursts of SVT.  He saw Edgar Jones in follow-up.  His last echo Doppler study in December 2020 showed an EF of 55 to 60%.  There was mild aortic valve stenosis with a mean gradient of 12 and valve area of 1.9 cm.  I last saw him in February 2022.  Time he denied any chest pain, presyncope, or syncope.  He was experiencing occasional palpitations and admitted to decreased energy level.  He continued to use CPAP and received a new machine approximately 3 years ago.  He had been recently started on Zetia to take in addition to his atorvastatin 40 mg with an LDL cholesterol of 117 and also has been taking over-the-counter fish oil.  He has been on benazepril 40 mg daily for hypertension and takes omeprazole for GERD.  I recommended that he have a follow-up echo Doppler  study to reassess systolic and diastolic function as well as aortic valve disease.  His ECG showed sinus rhythm with marked sinus arrhythmia, PVCs, first-degree AV block and a ventricular rate in the 40s.  I suggested he wear a 2-week event monitor.  Irfan wore a Zio patch monitor from February 10 through January 04, 2021.  The predominant rhythm was sinus rhythm with average rate at 63.  His slowest heart rate was sinus bradycardia at 30 bpm which occurred while sleeping at 1:58 AM on December 30, 2020.  The maximum rate was 154 bpm.  He had 3 episodes of nonsustained VT with the fastest interval of 4 beats at a maximum rate of 154 and the longest lasted 4 beats with an average rate of 123.  He had 22 brief episodes of SVT with the fastest interval lasting 7 beats with a maximum rate of 144 and the longest interval lasting 16 beats with an average rate of 103.  He had frequent PACs, occasional atrial couplets, isolated PVCs with rare couplets and triplets.  He also had ventricular bigeminy.  With his history of episodes I did not start him on daily metoprolol but recommended heat take metoprolol tartrate 12.5 mg to 25  mg on an as-needed basis for palpitations or bursts of SVT.  He denies any chest pain.  His echo Doppler study on January 11, 2021 showed low normal EF at 50 to 55% without wall motion abnormalities.  There was mild LVH.  There was a mitral annuloplasty ring and mild residual mitral regurgitation.  There was mild to moderate aortic sclerosis without stenosis.  Is only he continues to use CPAP.  I obtained a new download from March 14 through February 20, 2021.  Compliance is 100% with average use 5 hours and 46 minutes.  His pressure setting is a range of 5 to 15 cm and his 95th percentile pressure is 11.7 with maximum average pressure 13.1.  AHI is 3.2.  His CPAP set up date was 2015.  As result he has a 3G CPAP unit and since mid April 2022 this no longer is downloadable wirelessly due to the change to 5G.  He presents for evaluation he presents for follow-up evaluation.  Past Medical History:  Diagnosis Date  . Dyslipidemia   . Endocarditis   . GERD (gastroesophageal reflux disease)   . Heart murmur   . History of stress test 11/22/2010   Perfusion defect in the inferior myocardial region is consistent with diaphragmatic attenuation. The remaining myocardium demonstrates normal myocardial perfusion with no evidence of ischemia or infarct. The post stress left ventricle is normal in size. The post stress EF 65%, Gobal left ventricle systolic function is normal. No significant wall motion abnormalities noted. Normal Myocardial perfus  . Hx of echocardiogram 02/10/2013   The cavity size was normal. Ststolic function was normal. The estimated EF was in the range 55%-60%. Wall motion was normal; there was no regional wall motion abnormalities. Stable MV repair & annuloplasty ring. Trace MR  . Hyperlipidemia   . Hypertension   . OSA on CPAP   . SBE (subacute bacterial endocarditis)   . Sleep apnea   . Stroke [facility])    3/10 endocarditis/embolic    Past Surgical History:  Procedure Laterality Date  .  CARDIAC CATHETERIZATION  01/2009  . COLONOSCOPY    . HERNIA REPAIR     right abd  . INGUINAL HERNIA REPAIR     left side  . MITRAL VALVE REPAIR  04/2009   23m Edwards annuloplasty ring and quadrangular resection pf P2 segment of the motral valve by Dr HRoxan Hockeywith normal coronary arteries.  .Marland KitchenPOLYPECTOMY    . UPPER GASTROINTESTINAL ENDOSCOPY      No Known Allergies  Current Outpatient Medications  Medication Sig Dispense Refill  . amoxicillin (AMOXIL) 500 MG capsule Take 500 mg by mouth 3 (three) times daily. Take before dentist    . aspirin EC 81 MG tablet Take 81 mg by mouth daily.    .Marland Kitchenatorvastatin (LIPITOR) 40 MG tablet TAKE 1 TABLET BY MOUTH  DAILY 90 tablet 3  . benazepril (LOTENSIN) 40 MG tablet TAKE 1 TABLET BY MOUTH  DAILY 90 tablet 3  . ezetimibe (ZETIA) 10 MG tablet TAKE 1 TABLET BY MOUTH EVERY DAY 90 tablet 3  . ibuprofen (ADVIL) 600 MG tablet Take 600 mg by mouth every 6 (six) hours as needed.    . metoprolol tartrate (LOPRESSOR) 25 MG tablet Take 1/2 tablet (12.5 mg) to 1 tablet (25 mg) as needed for palpations 30 tablet 3  . Omega-3 Fatty Acids (FISH OIL) 1000 MG CAPS Take 2 capsules by mouth daily.    .Marland Kitchenomeprazole (PRILOSEC) 40 MG capsule TAKE ONE CAPSULE BY MOUTH EVERY DAY 30 capsule 6   No current facility-administered medications for this visit.    Socially he is married has 3 children and 2 grandchildren. There is a remote tobacco history but he quit over 30 years ago. 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 He does play golf and rides a bike.  Family History  Problem Relation Age of Onset  . Heart disease Mother   . Heart disease Father   . Arthritis Father   . Cirrhosis Brother   . Hypertension Maternal Aunt   . Colon polyps Brother   . Hypertension Brother   . Hyperlipidemia Brother   . Colon cancer Neg Hx   . Esophageal cancer Neg Hx   . Stomach  cancer Neg Hx   . Rectal cancer Neg Hx    He tells me that his father died with an aortic aneurysm.  His sister had pancreatic cancer.  ROS General: Negative; No fevers, chills, or night sweats;  HEENT: Negative; No changes in vision or hearing, sinus congestion, difficulty swallowing Pulmonary: Negative; No cough, wheezing, shortness of breath, hemoptysis Cardiovascular: see HPI GI: Negative; No nausea, vomiting, diarrhea, or abdominal pain GU: Negative; No dysuria, hematuria, or difficulty voiding Musculoskeletal: Negative; no myalgias, joint pain, or weakness Hematologic/Oncology: Negative; no easy bruising, bleeding Endocrine: Negative; no heat/cold intolerance; no diabetes Neuro: See history of present illness; no changes in balance, headaches Skin: Negative; No rashes or skin lesions Psychiatric: Negative; No behavioral problems, depression Sleep: Positive for sleep apnea on CPAP therapy;  He admits to 100% compliance; No snoring, daytime sleepiness, hypersomnolence, bruxism, restless legs, hypnogognic hallucinations, no cataplexy Other comprehensive 14 point system review is negative.   PE BP (!) 142/86   Pulse (!) 48   Ht _0  (1.905 m)   Wt 220 lb (99.8 kg)   SpO2 97%   BMI 27.50 kg/m    Repeat blood pressure by me was 146/82.  Wt Readings from Last 3 Encounters:  03/27/21 220 lb (99.8 kg)  03/08/21 229 lb (103.9 kg)  12/14/20 225 lb 3.2 oz (102.2 kg)   General: Alert, oriented, no distress.  Skin: normal  turgor, no rashes, warm and dry HEENT: Normocephalic, atraumatic. Pupils equal round and reactive to light; sclera anicteric; extraocular muscles intact;  Nose without nasal septal hypertrophy Mouth/Parynx benign; Mallinpatti scale 3 Neck: No JVD, no carotid bruits; normal carotid upstroke Lungs: clear to ausculatation and percussion; no wheezing or rales Chest wall: without tenderness to palpitation Heart: PMI not displaced, RRR, s1 s2 normal, 1/6 systolic  murmur, no diastolic murmur, no rubs, gallops, thrills, or heaves Abdomen: soft, nontender; no hepatosplenomehaly, BS+; abdominal aorta nontender and not dilated by palpation. Back: no CVA tenderness Pulses 2+ Musculoskeletal: full range of motion, normal strength, no joint deformities Extremities: no clubbing cyanosis or edema, Homan's sign negative  Neurologic: grossly nonfocal; Cranial nerves grossly wnl Psychologic: Normal mood and affect   ECG (independently read by me): Sinus bradycarda st 48, marked sinus arrythmia with 1.9 second pause;  1st degree AV block, PR 214  February 2022 ECG (independently read by me): Sinus rhythm with marked sinus arrhythmia, PACs, ventricular rate averaging 46 bpm. First-degree AV block with a PR interval at 220 ms. Inferolateral T wave abnormality.  May 2017 ECG (independently read by me): Sinus bradycardia at 48 bpm with mild sinus arrhythmia and first-degree AV block.  Isolated PAC.  PR interval 232 ms.  QTc interval 394 ms.  May 2018 ECG (independently read by me): Normal sinus rhythm at 60 bpm.  First degree AV block.  PAC.  PR interval 214 ms per QTc interval 394 ms.  March 2018 ECG (independently read by me): Normal sinus rhythm at 63 bpm with sinus arrhythmia, first-degree AV block, nonspecific T changes.  March 2017 ECG (independently read by me):  Sinus bradycardia 50 bpm with first-degree block.  No ectopy. Mild T wave abnormality inferiorly.  September 2016 ECG (independently read by me): Sinus bradycardia 53 bpm.  First-degree AV block with a PR interval at 224 ms.  ECG (independently read by me): Normal sinus rhythm at 63 bpm.  Occasional PVC.  First-degree AV block with a PR interval at 212.  September 2015 ECG (independently read by me): Sinus bradycardia 51 beats per minute.  Borderline first-degree V. block with a PR interval of 208 ms.  QTc interval 411 milliseconds  November 23, 2013 ECG (independently read by me): Sinus bradycardia  with very mild first-degree block with a PR interval of 218 ms. No significant ST changes.  LABS:  BMP Latest Ref Rng & Units 03/22/2021 11/02/2020 03/12/2018  Glucose 65 - 99 mg/dL 84 90 89  BUN 8 - 27 mg/dL _0 Creatinine 0.76 - 1.27 mg/dL 1.15 1.14 1.10  BUN/Creat Ratio 10 - _1 Sodium 134 - 144 mmol/L 138 139 142  Potassium 3.5 - 5.2 mmol/L 4.2 5.0 5.0  Chloride 96 - 106 mmol/L 104 103 104  CO2 20 - 29 mmol/L _2 Calcium 8.6 - 10.2 mg/dL 8.5(L) 9.4 9.8   Hepatic Function Latest Ref Rng & Units 03/22/2021 11/02/2020 03/12/2018  Total Protein 6.0 - 8.5 g/dL 6.4 7.2 6.8  Albumin 3.7 - 4.7 g/dL 4.3 4.6 4.4  AST 0 - 40 IU/L _3 ALT 0 - 44 IU/L _4 Alk Phosphatase 44 - 121 IU/L 47 52 44  Total Bilirubin 0.0 - 1.2 mg/dL 0.4 0.8 0.5   CBC Latest Ref Rng & Units 11/02/2020 08/09/2015 02/06/2015  WBC 3.4 - 10.8 x10E3/uL 5.8 4.4 4.7  Hemoglobin 13.0 - 17.7 g/dL 15.0 14.0 14.5  Hematocrit 37.5 - 51.0 % 45.5 43.1 44.4  Platelets 150 - 450 x10E3/uL 220 319 305   Lab Results  Component Value Date   MCV 93 11/02/2020   MCV 92.3 08/09/2015   MCV 92.7 02/06/2015   Lab Results  Component Value Date   TSH 1.410 11/02/2020   Lab Results  Component Value Date   HGBA1C  05/08/2009    4.6 (NOTE) The ADA recommends the following therapeutic goal for glycemic control related to Hgb A1c measurement: Goal of therapy: <6.5 Hgb A1c  Reference: American Diabetes Association: Clinical Practice Recommendations 2010, Diabetes Care, 2010, 33: (Suppl  1).   BNP No results found for: PROBNP  Lipid Panel     Component Value Date/Time   CHOL 109 03/22/2021 1057   TRIG 81 03/22/2021 1057   HDL 40 03/22/2021 1057   CHOLHDL 2.7 03/22/2021 1057   CHOLHDL 3.5 03/28/2017 0925   VLDL 26 03/28/2017 0925   LDLCALC 53 03/22/2021 1057     RADIOLOGY: No results found.  IMPRESSION:  No diagnosis found.    ASSESSMENT AND PLAN: [name] is a 75 year-old gentleman  who has a remote history of SBE and mitral valve endocarditis for which he required mitral valve repair surgery with quadrangular resection of his P2 segment of the mitral valve at which time a 32 mm Edwards annuloplasty ring was inserted in 2010.  An echo Doppler study in March 2017 showed stable mitral valve repair without stenosis or regurgitation.  He had normal systolic function with mild LVH.  There was evidence for mild aortic stenosis with a mean gradient of 11.  There was evidence for biatrial enlargement.  When I saw him in March 2018, he was hypertensive and I further titrated benazapril from 20 ro 40 mg. An echo Doppler study from 02/13/2017 continued to show a ejection fraction at 55-60%.  There was trivial aortic insufficiency.  His mitral valve annuloplasty ring was present and function normally.  There was no MR.  He had biatrial enlargement.  He wore a 2-week monitor in May 2021 after being found to have a slow heart rate.  His predominant rhythm was sinus rhythm.  The slowest heart rate was sinus bradycardia at 39 which occurred while sleeping and he was also noticed to have short bursts of SVT with the fastest interval lasting 5 beats with a maximum rate of 164 and the longest interval lasting 18 beats with an average rate at 118.  Not having seen him in several years at his last evaluation with me in February 2022 he was having a decline in energy and a slow heart rate and palpitations.  I reviewed his echo Doppler follow-up evaluation on January 11, 2021 which showed EF at 50 to 55% without wall motion abnormalities.  There was mild LVH.  He had a stable mitral annuloplasty ring with mild residual mitral regurgitation.  There was mild to moderate aortic sclerosis without stenosis.  I reviewed his 2-week Zio patch monitor in detail with him.  He has been documented to have marked sinus bradycardia with heart rate at 30 bpm while sleeping.  He also had 3 short-lived episodes of nonsustained VT, and  22 episodes of SVT as noted above.  It was recommended that he have a prescription for metoprolol tartrate to take 12.5 to 25 mg on a as needed basis and he was not started on daily beta-blocker therapy.  He has continued to use CPAP therapy.  His CPAP machine  was set up in 2015 which is 3G.  However since April 2022, the 3G devices are no longer downloadable with a 5G universal network.  I obtained a download up until April 12 which was the last downloadable data able to be obtained.  He has been set at a minimum pressure of 5 with maximum CPAP pressure 15 and his 95th percentile pressure was 11.7 with maximum average pressure 13.1.  AHI is 3.2.  I am changing his pressure range to 8 - 16 centimeters of water.  He is 100% compliant.  Qualifies for the new ResMed air sense 11 AutoSet device.  Unfortunately due to supply chain issues there is at least a 3 to 59-monthback log of orders.  I have recommended he obtain this new machine which will allow continued wireless download capability in the 5G network.  I have recommended he see EP for establishment of care guarding his rhythm issues.  I will see him in 6 months for follow-up evaluation.  TTroy Sine MD, FKindred Hospital Seattle5/17/2022 5:37 PM

## 2021-03-29 ENCOUNTER — Encounter: Payer: Self-pay | Admitting: Cardiovascular Disease

## 2021-03-29 ENCOUNTER — Other Ambulatory Visit: Payer: Self-pay | Admitting: Cardiovascular Disease

## 2021-03-29 DIAGNOSIS — G4733 Obstructive sleep apnea (adult) (pediatric): Secondary | ICD-10-CM

## 2021-05-02 ENCOUNTER — Encounter: Payer: Self-pay | Admitting: Internal Medicine

## 2021-05-02 ENCOUNTER — Other Ambulatory Visit: Payer: Self-pay

## 2021-05-02 ENCOUNTER — Ambulatory Visit: Payer: Medicare Other | Admitting: Internal Medicine

## 2021-05-02 VITALS — BP 146/80 | HR 64 | Ht 75.0 in | Wt 219.8 lb

## 2021-05-02 DIAGNOSIS — R001 Bradycardia, unspecified: Secondary | ICD-10-CM

## 2021-05-02 NOTE — Patient Instructions (Addendum)
Medication Instructions:  Your physician recommends that you continue on your current medications as directed. Please refer to the Current Medication list given to you today.  Labwork: None ordered.  Testing/Procedures: None ordered.  Follow-Up: Your physician wants you to follow-up in: as needed with Gregg Taylor, MD    Any Other Special Instructions Will Be Listed Below (If Applicable).  If you need a refill on your cardiac medications before your next appointment, please call your pharmacy.       

## 2021-05-02 NOTE — Progress Notes (Signed)
HPI 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 He is a pleasant 75 yo man with a h/o mitral valve disease, s/p repair.He has had transient spells of weakness several years ago but none recently. He wore a cardiac monitor 4 months ago that showed mostly nightime bradycardia as well as frequent PAC's, and some PVC's and very brief NS AT. He is asymptomatic. No sustained VT or SVT.  No Known Allergies   Current Outpatient Medications  Medication Sig Dispense Refill   amoxicillin (AMOXIL) 500 MG capsule Take 500 mg by mouth 3 (three) times daily. Take before dentist     aspirin EC 81 MG tablet Take 81 mg by mouth daily.     atorvastatin (LIPITOR) 40 MG tablet TAKE 1 TABLET BY MOUTH  DAILY 90 tablet 3   benazepril (LOTENSIN) 40 MG tablet TAKE 1 TABLET BY MOUTH  DAILY 90 tablet 3   ezetimibe (ZETIA) 10 MG tablet TAKE 1 TABLET BY MOUTH EVERY DAY 90 tablet 3   ibuprofen (ADVIL) 600 MG tablet Take 600 mg by mouth every 6 (six) hours as needed.     metoprolol tartrate (LOPRESSOR) 25 MG tablet Take 1/2 tablet (12.5 mg) to 1 tablet (25 mg) as needed for palpations 30 tablet 3   Omega-3 Fatty Acids (FISH OIL) 1000 MG CAPS Take 2 capsules by mouth daily.     omeprazole (PRILOSEC) 40 MG capsule TAKE ONE CAPSULE BY MOUTH EVERY DAY 30 capsule 6   No current facility-administered medications for this visit.     Past Medical History:  Diagnosis Date   Dyslipidemia    Endocarditis    GERD (gastroesophageal reflux disease)    Heart murmur    History of stress test 11/22/2010   Perfusion defect in the inferior myocardial region is consistent with diaphragmatic attenuation. The remaining myocardium demonstrates normal myocardial perfusion with no evidence of ischemia or infarct. The post stress left ventricle is normal in size. The post stress EF 65%, Gobal left ventricle systolic function is normal. No significant wall motion abnormalities noted. Normal Myocardial perfus   Hx of  echocardiogram 02/10/2013   The cavity size was normal. Ststolic function was normal. The estimated EF was in the range 55%-60%. Wall motion was normal; there was no regional wall motion abnormalities. Stable MV repair & annuloplasty ring. Trace MR   Hyperlipidemia    Hypertension    OSA on CPAP    SBE (subacute bacterial endocarditis)    Sleep apnea    Stroke (HCC)    3/10 endocarditis/embolic    ROS:   All systems reviewed and negative except as noted in the HPI.   Past Surgical History:  Procedure Laterality Date   CARDIAC CATHETERIZATION  01/2009   COLONOSCOPY     HERNIA REPAIR     right abd   INGUINAL HERNIA REPAIR     left side   MITRAL VALVE REPAIR  04/2009   27mm Edwards annuloplasty ring and quadrangular resection pf P2 segment of the motral valve by Dr Dorris Fetch with normal coronary arteries.   POLYPECTOMY     UPPER GASTROINTESTINAL ENDOSCOPY       Family History  Problem Relation Age of Onset   Heart disease Mother    Heart disease Father    Arthritis Father    Cirrhosis Brother    Hypertension Maternal Aunt    Colon polyps Brother    Hypertension Brother    Hyperlipidemia Brother    Colon  cancer Neg Hx    Esophageal cancer Neg Hx    Stomach cancer Neg Hx    Rectal cancer Neg Hx      Social History   Socioeconomic History   Marital status: Married    Spouse name: Not on file   Number of children: 2   Years of education: BS   Highest education level: Not on file  Occupational History   Occupation: Retired    Associate Professor: DISABLED   Occupation: [employer]  Tobacco Use   Smoking status: Former    Packs/day: 2.00    Years: 20.00    Pack years: 40.00    Types: Cigarettes    Quit date: 04/29/1981    Years since quitting: 40.0   Smokeless tobacco: Never  Vaping Use   Vaping Use: Never used  Substance and Sexual Activity   Alcohol use: Yes    Alcohol/week: 14.0 standard drinks    Types: 14 Standard drinks or equivalent per week    Comment: 2  glasses wine every day   Drug use: No   Sexual activity: Not on file  Other Topics Concern   Not on file  Social History Narrative   Not on file   Social Determinants of Health   Financial Resource Strain: Not on file  Food Insecurity: Not on file  Transportation Needs: Not on file  Physical Activity: Not on file  Stress: Not on file  Social Connections: Not on file  Intimate Partner Violence: Not on file     BP (!) 146/80   Pulse 64   Ht 6\' 3"  (1.905 m)   Wt 219 lb 12.8 oz (99.7 kg)   SpO2 98%   BMI 27.47 kg/m   Physical Exam:  Well appearing NAD HEENT: Unremarkable Neck:  No JVD, no thyromegally Lymphatics:  No adenopathy Back:  No CVA tenderness Lungs:  Clear with no wheezes HEART:  Regular rate rhythm, no murmurs, no rubs, no clicks Abd:  soft, positive bowel sounds, no organomegally, no rebound, no guarding Ext:  2 plus pulses, no edema, no cyanosis, no clubbing Skin:  No rashes no nodules Neuro:  CN II through XII intact, motor grossly intact  EKG - nsr   Assess/Plan:  Tachy-brady - he symptoms and findings on his event monitor are reassuring and do not warrant a PPM at this time. We discussed the symptoms he might experience if his spells worsen and if so, an ILR would be reasonable.  MV repair - his exam is good. No symptoms.  HTN - he will continue his low dose beta blocker and lotensin.  Aliha Diedrich,MD

## 2021-06-21 ENCOUNTER — Other Ambulatory Visit: Payer: Self-pay | Admitting: Cardiovascular Disease

## 2021-08-21 ENCOUNTER — Other Ambulatory Visit: Payer: Self-pay | Admitting: Cardiovascular Disease

## 2021-10-12 ENCOUNTER — Other Ambulatory Visit: Payer: Self-pay

## 2021-10-16 ENCOUNTER — Other Ambulatory Visit: Payer: Self-pay | Admitting: *Deleted

## 2021-10-16 MED ORDER — METOPROLOL TARTRATE 25 MG PO TABS: ORAL_TABLET | ORAL | 1 refills | Status: DC

## 2021-10-18 ENCOUNTER — Telehealth: Payer: Self-pay

## 2021-10-18 MED ORDER — EZETIMIBE 10 MG PO TABS: 10.0000 mg | ORAL_TABLET | Freq: Every day | ORAL | 3 refills | Status: DC

## 2021-10-18 MED ORDER — METOPROLOL TARTRATE 25 MG PO TABS: ORAL_TABLET | ORAL | 0 refills | Status: DC

## 2021-10-18 NOTE — Telephone Encounter (Signed)
Spoke to patient he stated he needs Zetia refill sent to [facility].Stated he does not need a refill at this time for Metoprolol.Stated he rarely has to take. Stated he would like all cpap supplies sent to [facility] in [place].I will send message to Dr.Kelly's sleep coordinator.

## 2021-10-22 NOTE — Telephone Encounter (Signed)
Placed a call to the patient to inform him per Dr Landry Dyke last office note, his OSA is being managed by [facility]. I asked him if this is still the case. He states that it is. I told him he will need to request his CPAP supplies from them. Patient voiced understanding.

## 2022-01-01 ENCOUNTER — Other Ambulatory Visit: Payer: Self-pay

## 2022-01-01 ENCOUNTER — Encounter: Payer: Self-pay | Admitting: Cardiovascular Disease

## 2022-01-01 ENCOUNTER — Ambulatory Visit: Payer: Medicare Other | Admitting: Cardiovascular Disease

## 2022-01-01 VITALS — BP 125/78 | HR 54 | Ht 75.0 in | Wt 223.8 lb

## 2022-01-01 DIAGNOSIS — Z9989 Dependence on other enabling machines and devices: Secondary | ICD-10-CM

## 2022-01-01 DIAGNOSIS — Z5181 Encounter for therapeutic drug level monitoring: Secondary | ICD-10-CM

## 2022-01-01 DIAGNOSIS — I495 Sick sinus syndrome: Secondary | ICD-10-CM

## 2022-01-01 DIAGNOSIS — Z7901 Long term (current) use of anticoagulants: Secondary | ICD-10-CM

## 2022-01-01 DIAGNOSIS — G4733 Obstructive sleep apnea (adult) (pediatric): Secondary | ICD-10-CM

## 2022-01-01 DIAGNOSIS — I10 Essential (primary) hypertension: Secondary | ICD-10-CM

## 2022-01-01 DIAGNOSIS — I4892 Unspecified atrial flutter: Secondary | ICD-10-CM

## 2022-01-01 DIAGNOSIS — Z9889 Other specified postprocedural states: Secondary | ICD-10-CM

## 2022-01-01 DIAGNOSIS — I359 Nonrheumatic aortic valve disorder, unspecified: Secondary | ICD-10-CM

## 2022-01-01 MED ORDER — APIXABAN 5 MG PO TABS: 5.0000 mg | ORAL_TABLET | Freq: Two times a day (BID) | ORAL | 11 refills | Status: DC

## 2022-01-01 NOTE — Patient Instructions (Addendum)
Medication Instructions:  Dr. Claiborne Billings has prescribed ELIQUIS 5mg  -- take 1 tablet every 12 hours You have been provided with samples and a free 30 day card  STOP aspirin  *If you need a refill on your cardiac medications before your next appointment, please call your pharmacy*   Follow-Up: At [facility], you and your health needs are our priority.  As part of our continuing mission to provide you with exceptional heart care, we have created designated Provider Care Teams.  These Care Teams include your primary Cardiologist (physician) and Advanced Practice Providers (APPs -  Physician Assistants and Nurse Practitioners) who all work together to provide you with the care you need, when you need it.  We recommend signing up for the patient portal called "MyChart".  Sign up information is provided on this After Visit Summary.  MyChart is used to connect with patients for Virtual Visits (Telemedicine).  Patients are able to view lab/test results, encounter notes, upcoming appointments, etc.  Non-urgent messages can be sent to your provider as well.   To learn more about what you can do with MyChart, go to [email or web address].    Your next appointment:    6 months with Dr. Claiborne Billings   Other Instructions  Please schedule follow up with Dr. Lovena Le

## 2022-01-01 NOTE — Progress Notes (Signed)
Patient ID: Edgar Jones, male   DOB: 1946-05-20, 76 y.o.   MRN: [id number]    Primary M.D.: Dr. Juanita Craver  HPI: [name] is a 76 year old former a patient of Dr. Rollene Fare. He presents for a 9 month month follow-up evaluation.   [name] underwent mitral valve repair or in June 2010 with a 32 mm Edwards annuplasty plasty ring and quadrangular resection of P2 segment of the mitral valve which was done by Dr. Roxan Hockey. 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 At that time he had normal coronary arteries. He has a history of obstructive sleep apnea since 2010 and has been on CPAP therapy and admits to 100% use. There is also a history of hypertension as well as hyperlipidemia in addition to GERD.  When I saw initially saw him in January 2015, he denied any episodes of chest pain. He noted mild episodes intermittently and feels at times she is unable to get a deep breath. An echo Doppler study in April 2014 confirmed normal systolic function with an ejection fraction of 55-60%. Left atrium was mildly dilated. His mitral valve repair and annuloplasty ring were stable there was no evidence for prolapse and only trivial regurgitation. He had mild TR.  Prior to initially seeing him in September 2015 he had experienced 2 transient events associated with transient right hand twitching and weakness as well as another episode of "total "vision.  He he denied any dizziness or he saw Dr. Floyde Parkins.  Carotid studies did not demonstrate any significant abnormality.  An MRI of his brain did not reveal any acute intracranial abnormality.  There was expected evolution of the left occipital lobe hemorrhage and small right parietal cortex infarct, which previously had been detected in 2010.  There was evidence for mild interval increase microhemorrhages in both cerebral hemispheres.  A repeat echo Doppler study was also done in June 2015,  which showed an ejection fraction in the range of 60-65%.  There was probable to diastolic dysfunction.  There is trivial aortic regurgitation.  His mitral valve repair was intact with moderately thickened leaflets.  His atrium was dilated.  His right ventricle was mildly dilated.  It was felt by Dr. Jannifer Franklin that he possibly he may have had a TIA, but this was not definitive.  The possibility of a future loop recorder was discussed if the patient had recurrent symptomatology.  The patient denies any recurrent symptoms.  He denies any awareness of any palpitations.  He denies chest pain.  He denies fever or shortness of breath.  He has a history of hypertension and has hyperlipidemia on simvastatin 40 mg.  There is a history of GERD for which she is on Prilosec.  He also has obstructive sleep apnea and continues to use CPAP with 100% compliance.   When seen in September 2016 he continued to be stable and denied any chest pain and had experienced mild occasional shortness of breath, not typically exertionally precipitated.  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  He has a history of GERD but this has been controlled with omeprazole.  He has been taking simvastatin 40 mg daily for hyperlipidemia. He has been on been  omeprazole 20 mg daily for high blood pressure.  Several years ago his triglycerides were elevated and omega-3 fatty acid was added to his lipid lowering therapy.  On 01/17/2016 he underwent a follow-up echo Doppler study. This showed an EF of 60-65% with mild LVH. There was very mild aortic stenosis with a mean gradient of 11 mm. Aortic root measured 38 mm. He had severe left atrial dilatation and moderate right atrial dilatation..  Pulmonary pressures were normal with a PA pressure at 27 mm.  His mitral valve repair was stable without significant stenosis.   He continues to drive a [name] and needs certified driving license. Laboratory by his  primary in January 2018: total cholesterol 202, triglycerides 225, HDL 39, and LDL 136 with a non-HDL of 163.  When I last saw him, I discontinued simvastatin, and in its place started rosuvastatin 20 mg.  He underwent a one-year follow-up echo Doppler study on 02/13/2017.  He has normal systolic function without regional wall motion abnormalities.  There was trivial aortic insufficiency.  His mitral annular ring prosthesis was present and function normally.  There was no MR.  He had severe LA dilatation and mild right a dilatation.  Subsequent  laboratory in May 2018 showed improvement in lipids, such that total cholesterol is now 149, triglycerides 130, HDL 42, and LDL 81.  Dates.  He still has been having red meat 2-3 times per week, which is usually a clean cut.  He also has fish and chicken.  LFTs were normal.    I last saw him in May 2019 and over the prior year he had continued to be stable.  He specifically denied any  chest pain or shortness of breath.  He denied any significant palpitation.  He continued to use CPAP with 100% compliance and is [facility] at [facility].  He continues to drive a truck.  He's asymptomatic with reference to dizziness, presyncope or syncope.  He had undergone a follow-up neurologic evaluation in April 2019 and was stable.  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  He was evaluated by Coletta Memos, NP in May and in June 2021.  He had been evaluated by his primary care physician in May 2021 and his heart rate was bradycardic in the upper 40s.  His ECG showed sinus rhythm with first-degree AV block with PACs.  He wore a Zio patch monitor which showed predominant rhythm at 67 bpm with only had some low heart rates with the lowest heart rate at 39 bpm which occurred while sleeping at 3:53 AM.  There also  were occasional very short bursts of SVT.  He saw Coletta Memos in follow-up.  His last echo Doppler study in December 2020 showed an EF of 55 to 60%.  There was mild aortic valve stenosis with a mean gradient of 12 and valve area of 1.9 cm.  I saw him in February 2022.  Time he denied any chest pain, presyncope, or syncope.  He was experiencing occasional palpitations and admitted to decreased energy level.  He continued to use CPAP and received a new machine approximately 3 years ago.  He had been recently started on Zetia to take in addition to his atorvastatin 40 mg with an LDL cholesterol of 117 and also has been taking over-the-counter fish oil.  He has been on benazepril 40 mg daily for hypertension and takes omeprazole for GERD.  I recommended that he have a follow-up echo Doppler study to reassess systolic and  diastolic function as well as aortic valve disease.  His ECG showed sinus rhythm with marked sinus arrhythmia, PVCs, first-degree AV block and a ventricular rate in the 40s.  I suggested he wear a 2-week event monitor.  I last saw him on Mar 27, 2021, 2022.  He wore a Zio patch monitor from February 10 through January 04, 2021.  The predominant rhythm was sinus rhythm with average rate at 63.  His slowest heart rate was sinus bradycardia at 30 bpm which occurred while sleeping at 1:58 AM on December 30, 2020.  The maximum rate was 154 bpm.  He had 3 episodes of nonsustained VT with the fastest interval of 4 beats at a maximum rate of 154 and the longest lasted 4 beats with an average rate of 123.  He had 22 brief episodes of SVT with the fastest interval lasting 7 beats with a maximum rate of 144 and the longest interval lasting 16 beats with an average rate of 103.  He had frequent PACs, occasional atrial couplets, isolated PVCs with rare couplets and triplets.  He also had ventricular bigeminy.  With his history of episodes I did not start him on daily metoprolol but recommended heat take  metoprolol tartrate 12.5 mg to 25 mg on an as-needed basis for palpitations or bursts of SVT.  He denies any chest pain.  His echo Doppler study on January 11, 2021 showed low normal EF at 50 to 55% without wall motion abnormalities.  There was mild LVH.  There was a mitral annuloplasty ring and mild residual mitral regurgitation.  There was mild to moderate aortic sclerosis without stenosis.  He continues to use CPAP.  I obtained a new download from March 14 through February 20, 2021.  Compliance is 100% with average use 5 hours and 46 minutes.  His pressure setting is a range of 5 to 15 cm and his 95th percentile pressure is 11.7 with maximum average pressure 13.1.  AHI is 3.2.  His CPAP set up date was 2015.  As result he has a 3G CPAP unit and since mid April 2022 this was no longer is downloadable wirelessly due to the change to 5G.    I recommended that he have an EP evaluation and on June 22nd, 2022 he saw Dr. 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  His cardiac monitor showed mostly nighttime bradycardia as well as frequent PACs and some PVCs but very brief nonsustained atrial tachycardia.  At that time, his symptoms and findings on his event monitor were reassuring and it was felt that his pacemaker was not necessary and if he did develop worsening symptoms he may be a candidate for a loop recorder.  Since I last saw him, he continues to drive a dump truck.  He denies any dizziness or lightheadedness.  He is unaware of any significant palpitations.  We were able to obtain a download from his DME company which continue to show excellent compliance from November 1 through December 09, 2021 with average CPAP use of 6 hours and 19 minutes.  AHI is 3.8 and his pressure range is set at 5 to 15 cm of water.  His 95th percentile pressure was 12.0.  His CPAP is followed at [facility].  He presents for evaluation.   Past Medical History:  Diagnosis Date   Dyslipidemia    Endocarditis    GERD  (gastroesophageal reflux disease)    Heart murmur    History of stress test 11/22/2010  Perfusion defect in the inferior myocardial region is consistent with diaphragmatic attenuation. The remaining myocardium demonstrates normal myocardial perfusion with no evidence of ischemia or infarct. The post stress left ventricle is normal in size. The post stress EF 65%, Gobal left ventricle systolic function is normal. No significant wall motion abnormalities noted. Normal Myocardial perfus   Hx of echocardiogram 02/10/2013   The cavity size was normal. Ststolic function was normal. The estimated EF was in the range 55%-60%. Wall motion was normal; there was no regional wall motion abnormalities. Stable MV repair & annuloplasty ring. Trace MR   Hyperlipidemia    Hypertension    OSA on CPAP    SBE (subacute bacterial endocarditis)    Sleep apnea    Stroke [facility])    3/10 endocarditis/embolic    Past Surgical History:  Procedure Laterality Date   CARDIAC CATHETERIZATION  01/2009   COLONOSCOPY     HERNIA REPAIR     right abd   INGUINAL HERNIA REPAIR     left side   MITRAL VALVE REPAIR  04/2009   59m Edwards annuloplasty ring and quadrangular resection pf P2 segment of the motral valve by Dr HRoxan Hockeywith normal coronary arteries.   POLYPECTOMY     UPPER GASTROINTESTINAL ENDOSCOPY      No Known Allergies  Current Outpatient Medications  Medication Sig Dispense Refill   amoxicillin (AMOXIL) 500 MG capsule Take 500 mg by mouth 3 (three) times daily. Take before dentist     apixaban (ELIQUIS) 5 MG TABS tablet Take 1 tablet (5 mg total) by mouth 2 (two) times daily. 60 tablet 11   atorvastatin (LIPITOR) 40 MG tablet TAKE 1 TABLET BY MOUTH  DAILY 90 tablet 3   benazepril (LOTENSIN) 40 MG tablet TAKE 1 TABLET BY MOUTH  DAILY 90 tablet 3   ezetimibe (ZETIA) 10 MG tablet Take 1 tablet (10 mg total) by mouth daily. 90 tablet 3   ibuprofen (ADVIL) 600 MG tablet Take 600 mg by mouth every 6 (six)  hours as needed.     metoprolol tartrate (LOPRESSOR) 25 MG tablet Take 1/2 to 1 tablet daily as needed for palpitations 180 tablet 0   Omega-3 Fatty Acids (FISH OIL) 1000 MG CAPS Take 2 capsules by mouth daily.     omeprazole (PRILOSEC) 40 MG capsule TAKE ONE CAPSULE BY MOUTH EVERY DAY 30 capsule 6   No current facility-administered medications for this visit.    Socially he is married has 3 children and 2 grandchildren. There is a remote tobacco history but he quit over 30 years ago. 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 He does play golf and rides a bike.  Family History  Problem Relation Age of Onset   Heart disease Mother    Heart disease Father    Arthritis Father    Cirrhosis Brother    Hypertension Maternal Aunt    Colon polyps Brother    Hypertension Brother    Hyperlipidemia Brother    Colon cancer Neg Hx    Esophageal cancer Neg Hx    Stomach cancer Neg Hx    Rectal cancer Neg Hx    He tells me that his father died with an aortic aneurysm.  His sister had pancreatic cancer.  ROS General: Negative; No fevers, chills, or night sweats;  HEENT: Negative; No changes in vision or hearing, sinus congestion, difficulty swallowing  Pulmonary: Negative; No cough, wheezing, shortness of breath, hemoptysis Cardiovascular: see HPI GI: Negative; No nausea, vomiting, diarrhea, or abdominal pain GU: Negative; No dysuria, hematuria, or difficulty voiding Musculoskeletal: Negative; no myalgias, joint pain, or weakness Hematologic/Oncology: Negative; no easy bruising, bleeding Endocrine: Negative; no heat/cold intolerance; no diabetes Neuro: See history of present illness; no changes in balance, headaches Skin: Negative; No rashes or skin lesions Psychiatric: Negative; No behavioral problems, depression Sleep: Positive for sleep apnea on CPAP therapy;  He admits to 100% compliance; No  snoring, daytime sleepiness, hypersomnolence, bruxism, restless legs, hypnogognic hallucinations, no cataplexy Other comprehensive 14 point system review is negative.   PE BP 125/78    Pulse (!) 54    Ht _0  (1.905 m)    Wt 223 lb 12.8 oz (101.5 kg)    SpO2 97%    BMI 27.97 kg/m    Repeat blood pressure by me was 128/78  Wt Readings from Last 3 Encounters:  01/01/22 223 lb 12.8 oz (101.5 kg)  05/02/21 219 lb 12.8 oz (99.7 kg)  03/27/21 220 lb (99.8 kg)   General: Alert, oriented, no distress.  Skin: normal turgor, no rashes, warm and dry HEENT: Normocephalic, atraumatic. Pupils equal round and reactive to light; sclera anicteric; extraocular muscles intact;  Nose without nasal septal hypertrophy Mouth/Parynx benign; Mallinpatti scale 3 Neck: No JVD, no carotid bruits; normal carotid upstroke Lungs: clear to ausculatation and percussion; no wheezing or rales Chest wall: without tenderness to palpitation Heart: PMI not displaced, irregular rhythm, s1 s2 normal, 1/6 systolic murmur, no diastolic murmur, no rubs, gallops, thrills, or heaves Abdomen: soft, nontender; no hepatosplenomehaly, BS+; abdominal aorta nontender and not dilated by palpation. Back: no CVA tenderness Pulses 2+ Musculoskeletal: full range of motion, normal strength, no joint deformities Extremities: no clubbing cyanosis or edema, Homan's sign negative  Neurologic: grossly nonfocal; Cranial nerves grossly wnl Psychologic: Normal mood and affect   January 03, 2022 ECG (independently read by me):  Atrial flutter with variable block at 51  Mar 27, 2021 ECG (independently read by me): Sinus bradycarda st 48, marked sinus arrythmia with 1.9 second pause;  1st degree AV block, PR 214  February 2022 ECG (independently read by me): Sinus rhythm with marked sinus arrhythmia, PACs, ventricular rate averaging 46 bpm. First-degree AV block with a PR interval at 220 ms. Inferolateral T wave abnormality.  May 2017 ECG  (independently read by me): Sinus bradycardia at 48 bpm with mild sinus arrhythmia and first-degree AV block.  Isolated PAC.  PR interval 232 ms.  QTc interval 394 ms.  May 2018 ECG (independently read by me): Normal sinus rhythm at 60 bpm.  First degree AV block.  PAC.  PR interval 214 ms per QTc interval 394 ms.  March 2018 ECG (independently read by me): Normal sinus rhythm at 63 bpm with sinus arrhythmia, first-degree AV block, nonspecific T changes.  March 2017 ECG (independently read by me):  Sinus bradycardia 50 bpm with first-degree block.  No ectopy. Mild T wave abnormality inferiorly.  September 2016 ECG (independently read by me): Sinus bradycardia 53 bpm.  First-degree AV block with a PR interval at 224 ms.  ECG (independently read by me): Normal sinus rhythm at 63 bpm.  Occasional PVC.  First-degree AV block with a PR interval at 212.  September 2015 ECG (independently read by me): Sinus bradycardia 51 beats per minute.  Borderline first-degree V. block with a PR interval of 208 ms.  QTc interval 411 milliseconds  November 23, 2013 ECG (independently read by me): Sinus bradycardia with very mild first-degree block with a PR interval of 218 ms. No significant ST changes.  LABS:  BMP Latest Ref Rng & Units 03/22/2021 11/02/2020 03/12/2018  Glucose 65 - 99 mg/dL 84 90 89  BUN 8 - 27 mg/dL _0 Creatinine 0.76 - 1.27 mg/dL 1.15 1.14 1.10  BUN/Creat Ratio 10 - _1 Sodium 134 - 144 mmol/L 138 139 142  Potassium 3.5 - 5.2 mmol/L 4.2 5.0 5.0  Chloride 96 - 106 mmol/L 104 103 104  CO2 20 - 29 mmol/L _2 Calcium 8.6 - 10.2 mg/dL 8.5(L) 9.4 9.8   Hepatic Function Latest Ref Rng & Units 03/22/2021 11/02/2020 03/12/2018  Total Protein 6.0 - 8.5 g/dL 6.4 7.2 6.8  Albumin 3.7 - 4.7 g/dL 4.3 4.6 4.4  AST 0 - 40 IU/L _3 ALT 0 - 44 IU/L _4 Alk Phosphatase 44 - 121 IU/L 47 52 44  Total Bilirubin 0.0 - 1.2 mg/dL 0.4 0.8 0.5   CBC Latest Ref Rng & Units  11/02/2020 08/09/2015 02/06/2015  WBC 3.4 - 10.8 x10E3/uL 5.8 4.4 4.7  Hemoglobin 13.0 - 17.7 g/dL 15.0 14.0 14.5  Hematocrit 37.5 - 51.0 % 45.5 43.1 44.4  Platelets 150 - 450 x10E3/uL 220 319 305   Lab Results  Component Value Date   MCV 93 11/02/2020   MCV 92.3 08/09/2015   MCV 92.7 02/06/2015   Lab Results  Component Value Date   TSH 1.410 11/02/2020   Lab Results  Component Value Date   HGBA1C  05/08/2009    4.6 (NOTE) The ADA recommends the following therapeutic goal for glycemic control related to Hgb A1c measurement: Goal of therapy: <6.5 Hgb A1c  Reference: American Diabetes Association: Clinical Practice Recommendations 2010, Diabetes Care, 2010, 33: (Suppl  1).   BNP No results found for: PROBNP  Lipid Panel     Component Value Date/Time   CHOL 109 03/22/2021 1057   TRIG 81 03/22/2021 1057   HDL 40 03/22/2021 1057   CHOLHDL 2.7 03/22/2021 1057   CHOLHDL 3.5 03/28/2017 0925   VLDL 26 03/28/2017 0925   LDLCALC 53 03/22/2021 1057     RADIOLOGY: No results found.  IMPRESSION:  1. 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 OSA on CPAP   4. S/P mitral valve repair   5. Essential hypertension   6. Mild aortic stenosis   7. Alteration in anticoagulation      ASSESSMENT AND PLAN: Mr. Lonie Newsham is a 76 year-old gentleman who has a remote history of SBE and mitral valve endocarditis for which he required mitral valve repair surgery with quadrangular resection of his P2 segment of the mitral valve at which time a 32 mm Edwards annuloplasty ring was inserted in 2010.  An echo Doppler study in March 2017 showed stable mitral valve repair without stenosis or regurgitation.  He had normal systolic function with mild LVH.  There was evidence for mild aortic stenosis with a mean gradient of 11.  There was evidence for biatrial enlargement.  When I saw him in March 2018, he was hypertensive and I further titrated benazapril  from 20 ro 40 mg. An echo Doppler study from 02/13/2017 continued to show a ejection fraction at 55-60%.  There was trivial aortic insufficiency.  His mitral valve annuloplasty ring was present and function normally.  There was no MR.  He had biatrial enlargement.  He wore a 2-week monitor in May 2021 after being found to have a slow heart rate.  His predominant rhythm was sinus rhythm.  The slowest heart rate was sinus bradycardia at 39 which occurred while sleeping and he was also noticed to have short bursts of SVT with the fastest interval lasting 5 beats with a maximum rate of 164 and the longest interval lasting 18 beats with an average rate at 118.  Not having seen him in several years at his last evaluation with me in February 2022 he was having a decline in energy and a slow heart rate and palpitations.  I reviewed his echo Doppler follow-up evaluation on January 11, 2021 which showed EF at 50 to 55% without wall motion abnormalities.  There was mild LVH.  He had a stable mitral annuloplasty ring with mild residual mitral regurgitation.  There was mild to moderate aortic sclerosis without stenosis.  On his Zio patch monitor he has been documented to have marked sinus bradycardia with heart rate at 30 bpm while sleeping.  He also had 3 short-lived episodes of nonsustained VT, and 22 episodes of SVT as noted above.  It was recommended that he have a prescription for metoprolol tartrate to take 12.5 to 25 mg on a as needed basis and he was not started on daily beta-blocker therapy.  I reviewed his subsequent evaluation with Dr. Crissie Sickles in light of his tachycardia-bradycardia syndrome who felt that the predominance of the bradycardia occurred at nighttime as well as frequent PACs, some PVCs and very brief nonsustained atrial tachycardia.  At that time, he was not felt to need a permanent pacemaker.  His ECG today shows that he is in atrial flutter with variable block.  He was unaware of this rhythm.  He denies  any dizziness or presyncope.  He still drives a dump truck.  He has not had recent laboratory I will check a comprehensive metabolic panel, magnesium level, CBC, TSH and lipid studies.  Presently he is not having any bleeding issues.  With his atrial flutter I have recommended he discontinue aspirin and in its place start Eliquis 5 mg twice a day based on his age and renal function.  I am also scheduling him for follow-up echo Doppler study for reassessment of systolic and diastolic function, as well as his previous aortic valve disease.  I reviewed his CPAP data which we were able to obtain through his DME company.  His machine is no longer wireless and I discussed with him that since his prescriptions are being done through [facility] that they consider possibly changing him from a pressure minimum of 5-15 to a pressure of 10-16 since his 95th percentile pressure is 12.0 and maximum average pressure 13.4.  He would also be a candidate to receive a new ResMed AirSense 11 AutoSet unit which [facility] can prescribe.  He states he typically is followed by a PA at [facility] regarding this.  I will notify him regarding his laboratory.  Since he has seen Dr. Lovena Le in June 2022 with his atrial flutter I will refer him back to Dr. Lovena Le for further evaluation and potential additional treatment or consideration of atrial flutter candidacy.   Troy Sine, MD, [facility] 01/06/2022 11:08 AM

## 2022-01-06 ENCOUNTER — Encounter: Payer: Self-pay | Admitting: Cardiovascular Disease

## 2022-01-09 ENCOUNTER — Ambulatory Visit: Payer: Medicare Other | Admitting: Neurology

## 2022-01-09 ENCOUNTER — Encounter: Payer: Self-pay | Admitting: Neurology

## 2022-01-09 VITALS — BP 135/84 | HR 55 | Ht 75.0 in | Wt 224.0 lb

## 2022-01-09 DIAGNOSIS — I679 Cerebrovascular disease, unspecified: Secondary | ICD-10-CM

## 2022-01-09 NOTE — Progress Notes (Signed)
Chart reviewed, ? ?Patient was followed up by our clinic for cerebrovascular disease, he is on Eliquis for atrial flutter ? ?I do not think we need to see patient regularly, may continue follow-up with his cardiologist and primary care physician, only return to clinic for new symptoms. ?

## 2022-01-09 NOTE — Progress Notes (Signed)
PATIENT: Edgar Jones DOB: [birth year]  REASON FOR VISIT: follow up for cerebrovascular disease  HISTORY FROM: patient PRIMARY NEUROLOGIST: Dr.Yan   HISTORY OF PRESENT ILLNESS: Today 01/09/22  Edgar Jones here today for follow-up. 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 Questionable TIA back in 2015. Sees cardiology,Dr. Claiborne Billings last week.  EKG showed atrial flutter, he was placed on Eliquis 5 mg twice a day. Will be seen by Dr. Lovena Le for consideration of atrial flutter candidacy. Does note less energy recently. Denies palpations or dizziness. At times his HR drops into 30's, claims can't feel any different. Still driving the dump truck. CDL evaluation is coming up mid march. Denies any recent TIA events or weakness. Describes back in 2015, 2 events, 1: right arm went limp, 2: lost eye sight both eyes for few moments. Sees PCP regularly. Denies any problems with vision, balance, or gait. 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   Update 03/08/2021 SS: Edgar Jones is a 76 year old male with history of cerebrovascular disease.  In 2015, he had 2 TIA type events, has been on blood pressure medication, aspirin, and statin drug since that time.  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  Follows with cardiology, wore a heart monitor, showed 3 episodes of Nonsustained VTAC, 22 episodes of SVT, reported given metoprolol as needed for palpitations, he has follow-up in May. 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 He drives a dump truck, works for [employer], full time hours as long as good weather. Using CPAP. BP slightly up today, just took medications before he got here.  Here today for evaluation unaccompanied.  He denies any changes to the vision, numbness or weakness to the arms or legs, or changes to the balance.  Update 03/08/20 SS: Edgar Jones is a 76 year old male with a history of cerebrovascular  disease.  In 2015, he had 2 TIA type events, he has been on blood pressure medication, aspirin, and statin drug since that time.  He works as a Administrator, he requires Heritage manager for [employer].  He has not had any further TIA events.  He denies any changes to the vision, numbness or weakness to the arms or legs, changes to the balance, chest pain, palpitations.  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  He feels he is overall doing well.  He says he may have some shortness of breath, at the end of a long workday, otherwise no problems.  He presents today for annual evaluation.  HISTORY 03/08/2019 Dr. Jannifer Franklin: Edgar Jones is a 76 year old right-handed white male with a history of cerebrovascular disease.  In 2015 he had two TIA type events, he has been on blood pressure medications, aspirin, and a statin drug since that time, he has not had any recurrence.  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  The patient reports no new symptoms over the last year.  He denies any headache, vision changes, or numbness or weakness of the face, arms, legs.  He denies any speech or swallowing problems and he denies any balance issues.  He has not reported any new medical issues that have come up since last seen.  He continues to work as a Administrator.  The patient does report occasional episodes of shortness of breath, he denies any palpitations of the heart  or severe fatigue with these events.  REVIEW OF SYSTEMS: Out of a complete 14 system review of symptoms, the patient complains only of the following symptoms, and all other reviewed systems are negative.  See HPI  ALLERGIES: No Known Allergies  HOME MEDICATIONS: Outpatient Medications Prior to Visit  Medication Sig Dispense Refill   amoxicillin (AMOXIL) 500 MG capsule Take 500 mg by mouth 3 (three) times daily. Take before dentist     apixaban (ELIQUIS) 5 MG TABS tablet Take 1 tablet (5 mg total) by mouth 2 (two) times  daily. 60 tablet 11   atorvastatin (LIPITOR) 40 MG tablet TAKE 1 TABLET BY MOUTH  DAILY 90 tablet 3   benazepril (LOTENSIN) 40 MG tablet TAKE 1 TABLET BY MOUTH  DAILY 90 tablet 3   ezetimibe (ZETIA) 10 MG tablet Take 1 tablet (10 mg total) by mouth daily. 90 tablet 3   ibuprofen (ADVIL) 600 MG tablet Take 600 mg by mouth every 6 (six) hours as needed.     metoprolol tartrate (LOPRESSOR) 25 MG tablet Take 1/2 to 1 tablet daily as needed for palpitations 180 tablet 0   Omega-3 Fatty Acids (FISH OIL) 1000 MG CAPS Take 2 capsules by mouth daily.     omeprazole (PRILOSEC) 40 MG capsule TAKE ONE CAPSULE BY MOUTH EVERY DAY 30 capsule 6   No facility-administered medications prior to visit.    PAST MEDICAL HISTORY: Past Medical History:  Diagnosis Date   Dyslipidemia    Endocarditis    GERD (gastroesophageal reflux disease)    Heart murmur    History of stress test 11/22/2010   Perfusion defect in the inferior myocardial region is consistent with diaphragmatic attenuation. The remaining myocardium demonstrates normal myocardial perfusion with no evidence of ischemia or infarct. The post stress left ventricle is normal in size. The post stress EF 65%, Gobal left ventricle systolic function is normal. No significant wall motion abnormalities noted. Normal Myocardial perfus   Hx of echocardiogram 02/10/2013   The cavity size was normal. Ststolic function was normal. The estimated EF was in the range 55%-60%. Wall motion was normal; there was no regional wall motion abnormalities. Stable MV repair & annuloplasty ring. Trace MR   Hyperlipidemia    Hypertension    OSA on CPAP    SBE (subacute bacterial endocarditis)    Sleep apnea    Stroke ([place])    3/10 endocarditis/embolic    PAST SURGICAL HISTORY: Past Surgical History:  Procedure Laterality Date   CARDIAC CATHETERIZATION  01/2009   COLONOSCOPY     HERNIA REPAIR     right abd   INGUINAL HERNIA REPAIR     left side   MITRAL VALVE REPAIR   04/2009   35mm Edwards annuloplasty ring and quadrangular resection pf P2 segment of the motral valve by Dr Roxan Hockey with normal coronary arteries.   POLYPECTOMY     UPPER GASTROINTESTINAL ENDOSCOPY      FAMILY HISTORY: Family History  Problem Relation Age of Onset   Heart disease Mother    Heart disease Father    Arthritis Father    Cirrhosis Brother    Hypertension Maternal Aunt    Colon polyps Brother    Hypertension Brother    Hyperlipidemia Brother    Colon cancer Neg Hx    Esophageal cancer Neg Hx    Stomach cancer Neg Hx    Rectal cancer Neg Hx     SOCIAL HISTORY: Social History   Socioeconomic History   Marital  status: Married    Spouse name: Not on file   Number of children: 2   Years of education: BS   Highest education level: Not on file  Occupational History   Occupation: Retired    Fish farm manager: DISABLED   Occupation: [employer]  Tobacco Use   Smoking status: Former    Packs/day: 2.00    Years: 20.00    Pack years: 40.00    Types: Cigarettes    Quit date: 04/29/1981    Years since quitting: 40.7   Smokeless tobacco: Never  Vaping Use   Vaping Use: Never used  Substance and Sexual Activity   Alcohol use: Yes    Alcohol/week: 14.0 standard drinks    Types: 14 Standard drinks or equivalent per week    Comment: 2 glasses wine every day   Drug use: No   Sexual activity: Not on file  Other Topics Concern   Not on file  Social History Narrative   Not on file   Social Determinants of Health   Financial Resource Strain: Not on file  Food Insecurity: Not on file  Transportation Needs: Not on file  Physical Activity: Not on file  Stress: Not on file  Social Connections: Not on file  Intimate Partner Violence: Not on file   PHYSICAL EXAM  Vitals:   01/09/22 0741  BP: 135/84  Pulse: (!) 55  Weight: 224 lb (101.6 kg)  Height: 6\' 3"  (1.905 m)    Body mass index is 28 kg/m.  Generalized: Well developed, in no acute distress  Neurological  examination  Mentation: Alert oriented to time, place, history taking. Follows all commands speech and language fluent Cranial nerve II-XII: Pupils were equal round reactive to light. Extraocular movements were full, visual field were full on confrontational test. Facial sensation and strength were normal. Head turning and shoulder shrug  were normal and symmetric. Motor: The motor testing reveals 5 over 5 strength of all 4 extremities. Good symmetric motor tone is noted throughout.  Sensory: Sensory testing is intact to soft touch on all 4 extremities. No evidence of extinction is noted.  Coordination: Cerebellar testing reveals good finger-nose-finger and heel-to-shin bilaterally.  Gait and station: Gait is normal.  Reflexes: Deep tendon reflexes are symmetric and normal bilaterally.   DIAGNOSTIC DATA (LABS, IMAGING, TESTING) - I reviewed patient records, labs, notes, testing and imaging myself where available.  Lab Results  Component Value Date   WBC 5.8 11/02/2020   HGB 15.0 11/02/2020   HCT 45.5 11/02/2020   MCV 93 11/02/2020   PLT 220 11/02/2020      Component Value Date/Time   NA 138 03/22/2021 1057   K 4.2 03/22/2021 1057   CL 104 03/22/2021 1057   CO2 21 03/22/2021 1057   GLUCOSE 84 03/22/2021 1057   GLUCOSE 90 03/28/2017 0925   BUN 17 03/22/2021 1057   CREATININE 1.15 03/22/2021 1057   CREATININE 1.10 03/28/2017 0925   CALCIUM 8.5 (L) 03/22/2021 1057   PROT 6.4 03/22/2021 1057   ALBUMIN 4.3 03/22/2021 1057   AST 25 03/22/2021 1057   ALT 24 03/22/2021 1057   ALKPHOS 47 03/22/2021 1057   BILITOT 0.4 03/22/2021 1057   GFRNONAA 63 11/02/2020 1110   GFRAA 73 11/02/2020 1110   Lab Results  Component Value Date   CHOL 109 03/22/2021   HDL 40 03/22/2021   LDLCALC 53 03/22/2021   TRIG 81 03/22/2021   CHOLHDL 2.7 03/22/2021   Lab Results  Component Value Date  HGBA1C  05/08/2009    4.6 (NOTE) The ADA recommends the following therapeutic goal for glycemic control  related to Hgb A1c measurement: Goal of therapy: <6.5 Hgb A1c  Reference: American Diabetes Association: Clinical Practice Recommendations 2010, Diabetes Care, 2010, 33: (Suppl  1).   No results found for: VITAMINB12 Lab Results  Component Value Date   TSH 1.410 11/02/2020   ASSESSMENT AND PLAN 76 y.o. year old male  has a past medical history of Dyslipidemia, Endocarditis, GERD (gastroesophageal reflux disease), Heart murmur, History of stress test (11/22/2010), echocardiogram (02/10/2013), Hyperlipidemia, Hypertension, OSA on CPAP, SBE (subacute bacterial endocarditis), Sleep apnea, and Stroke ([place]). here with:  1.  Cerebrovascular disease (history of stroke 2010 cerebral infarcts, TIA x 2 in 2015) 2.  Recent Atrial Flutter by EKG, sees Dr. Kelly/Dr. Lovena Le  -Overall stable from a neurological standpoint -Needs to continue close follow-up with cardiology, is now on Eliquis for atrial flutter, aspirin has been stopped, planning to see Dr. 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

## 2022-01-11 ENCOUNTER — Other Ambulatory Visit: Payer: Self-pay

## 2022-01-11 ENCOUNTER — Other Ambulatory Visit: Payer: Medicare Other

## 2022-01-11 DIAGNOSIS — I471 Supraventricular tachycardia: Secondary | ICD-10-CM

## 2022-01-11 LAB — CBC

## 2022-01-12 LAB — COMPREHENSIVE METABOLIC PANEL
ALT: 17 IU/L (ref 0–44)
AST: 17 IU/L (ref 0–40)
Albumin/Globulin Ratio: 2 (ref 1.2–2.2)
Albumin: 4.6 g/dL (ref 3.7–4.7)
Alkaline Phosphatase: 50 IU/L (ref 44–121)
BUN/Creatinine Ratio: 14 (ref 10–24)
BUN: 16 mg/dL (ref 8–27)
Bilirubin Total: 0.9 mg/dL (ref 0.0–1.2)
CO2: 24 mmol/L (ref 20–29)
Calcium: 9.3 mg/dL (ref 8.6–10.2)
Chloride: 103 mmol/L (ref 96–106)
Creatinine, Ser: 1.15 mg/dL (ref 0.76–1.27)
Globulin, Total: 2.3 g/dL (ref 1.5–4.5)
Glucose: 81 mg/dL (ref 70–99)
Potassium: 4.9 mmol/L (ref 3.5–5.2)
Sodium: 139 mmol/L (ref 134–144)
Total Protein: 6.9 g/dL (ref 6.0–8.5)
eGFR: 66 mL/min/{1.73_m2} (ref >59)

## 2022-01-12 LAB — CBC
Hematocrit: 44.8 % (ref 37.5–51.0)
Hemoglobin: 14.8 g/dL (ref 13.0–17.7)
MCH: 30.3 pg (ref 26.6–33.0)
MCHC: 33 g/dL (ref 31.5–35.7)
MCV: 92 fL (ref 79–97)
Platelets: 100 10*3/uL — CL (ref 150–450)
RBC: 4.88 x10E6/uL (ref 4.14–5.80)
RDW: 12.9 % (ref 11.6–15.4)
WBC: 4.9 10*3/uL (ref 3.4–10.8)

## 2022-01-12 LAB — LIPID PANEL
Chol/HDL Ratio: 3.1 ratio (ref 0.0–5.0)
Cholesterol, Total: 120 mg/dL (ref 100–199)
HDL: 39 mg/dL — ABNORMAL LOW (ref >39)
LDL Chol Calc (NIH): 63 mg/dL (ref 0–99)
Triglycerides: 91 mg/dL (ref 0–149)
VLDL Cholesterol Cal: 18 mg/dL (ref 5–40)

## 2022-01-12 LAB — MAGNESIUM: Magnesium: 2.1 mg/dL (ref 1.6–2.3)

## 2022-01-12 LAB — TSH: TSH: 1.08 u[IU]/mL (ref 0.450–4.500)

## 2022-01-15 ENCOUNTER — Other Ambulatory Visit: Payer: Self-pay

## 2022-01-15 DIAGNOSIS — D696 Thrombocytopenia, unspecified: Secondary | ICD-10-CM

## 2022-01-16 ENCOUNTER — Encounter: Payer: Self-pay | Admitting: Cardiovascular Disease

## 2022-01-18 ENCOUNTER — Telehealth: Payer: Self-pay

## 2022-01-18 ENCOUNTER — Other Ambulatory Visit: Payer: Self-pay

## 2022-01-18 DIAGNOSIS — D696 Thrombocytopenia, unspecified: Secondary | ICD-10-CM

## 2022-01-18 NOTE — Telephone Encounter (Signed)
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  if you could write me a letter it would enable me to continue to work awhile longer.  thank you.   Edgar Jones ? ? ?Can we write him a letter? If you say okay, I can complete.  ? ?Thank you!  ? ? ? ?

## 2022-01-21 LAB — CBC
Hematocrit: 44.2 % (ref 37.5–51.0)
Hemoglobin: 14.9 g/dL (ref 13.0–17.7)
MCH: 31 pg (ref 26.6–33.0)
MCHC: 33.7 g/dL (ref 31.5–35.7)
MCV: 92 fL (ref 79–97)
Platelets: 124 10*3/uL — ABNORMAL LOW (ref 150–450)
RBC: 4.81 x10E6/uL (ref 4.14–5.80)
RDW: 12.6 % (ref 11.6–15.4)
WBC: 5.3 10*3/uL (ref 3.4–10.8)

## 2022-01-27 NOTE — Progress Notes (Signed)
? ?Cardiology Office Note ?Date:  01/27/2022  ?Patient ID:  Edgar, Jones 25-Jan-1946, MRN [id number] ?PCP:  Heywood Bene, PA-C  ?Cardiologist:  Dr. Claiborne Billings ?Electrophysiologist: Dr. Lovena Le ? ?  ?Chief Complaint:  new Aflutter ? ?History of Present Illness: ?Edgar Jones is a 76 y.o. male with history of VHD w/MV repair, HTN, OSA ? ?Referred to Dr. Lovena Le June 2022 after wearing a monitor for remote symptoms of weakness (apparently years prior), monitor by Dr. Tanna Furry note showed PAC's, and some PVC's and very brief NS AT, no sustained VT or SVT ?No need for , discussed of recurrent symptoms, would consider loop implant. ? ?He saw Dr. 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  he was feeling well, found in rate controlled AFlutter, his ASA stopped and started on Eliquis. ?Referred back to EP for management options of his Aflutter, consideration of ablation. Update echo ? ?LVEF 50-55%, no WMA, MR working well ? ?TODAY ?He feels well ?No issues or concerns with the eliquis, no bleeding or signs of bleeding ?When he checks his BP he has noticed the heart icon irregular, outside of that unaware of his Aflutter ?NO CP, palpitations, SOB ? ?He has not felt the need to take the prn metoprolol ? ? ? ?Past Medical History:  ?Diagnosis Date  ? Dyslipidemia   ? Endocarditis   ? GERD (gastroesophageal reflux disease)   ? Heart murmur   ? History of stress test 11/22/2010  ? Perfusion defect in the inferior myocardial region is consistent with diaphragmatic attenuation. The remaining myocardium demonstrates normal myocardial perfusion with no evidence of ischemia or infarct. The post stress left ventricle is normal in size. The post stress EF 65%, Gobal left ventricle systolic function is normal. No significant wall motion abnormalities noted. Normal Myocardial perfus  ? Hx of echocardiogram 02/10/2013  ? The cavity size was normal. Ststolic function was normal.  The estimated EF was in the range 55%-60%. Wall motion was normal; there was no regional wall motion abnormalities. Stable MV repair & annuloplasty ring. Trace MR  ? Hyperlipidemia   ? Hypertension   ? OSA on CPAP   ? SBE (subacute bacterial endocarditis)   ? Sleep apnea   ? Stroke [facility])   ? 3/10 endocarditis/embolic  ? ? ?Past Surgical History:  ?Procedure Laterality Date  ? CARDIAC CATHETERIZATION  01/2009  ? COLONOSCOPY    ? HERNIA REPAIR    ? right abd  ? INGUINAL HERNIA REPAIR    ? left side  ? MITRAL VALVE REPAIR  04/2009  ? 67mm Edwards annuloplasty ring and quadrangular resection pf P2 segment of the motral valve by Dr Roxan Hockey with normal coronary arteries.  ? POLYPECTOMY    ? UPPER GASTROINTESTINAL ENDOSCOPY    ? ? ?Current Outpatient Medications  ?Medication Sig Dispense Refill  ? amoxicillin (AMOXIL) 500 MG capsule Take 500 mg by mouth 3 (three) times daily. Take before dentist    ? apixaban (ELIQUIS) 5 MG TABS tablet Take 1 tablet (5 mg total) by mouth 2 (two) times daily. 60 tablet 11  ? atorvastatin (LIPITOR) 40 MG tablet TAKE 1 TABLET BY MOUTH  DAILY 90 tablet 3  ? benazepril (LOTENSIN) 40 MG tablet TAKE 1 TABLET BY MOUTH  DAILY 90 tablet 3  ? ezetimibe (ZETIA) 10 MG tablet Take 1 tablet (10 mg total) by mouth daily. 90 tablet 3  ? ibuprofen (ADVIL) 600 MG tablet Take 600 mg by  mouth every 6 (six) hours as needed.    ? metoprolol tartrate (LOPRESSOR) 25 MG tablet Take 1/2 to 1 tablet daily as needed for palpitations 180 tablet 0  ? Omega-3 Fatty Acids (FISH OIL) 1000 MG CAPS Take 2 capsules by mouth daily.    ? omeprazole (PRILOSEC) 40 MG capsule TAKE ONE CAPSULE BY MOUTH EVERY DAY 30 capsule 6  ? ?No current facility-administered medications for this visit.  ? ? ?Allergies:   Patient has no known allergies.  ? ?Social History:  The patient  reports that he quit smoking about 40 years ago. His smoking use included cigarettes. He has a 40.00 pack-year smoking history. He has never used smokeless  tobacco. He reports current alcohol use of about 14.0 standard drinks per week. He reports that he does not use drugs.  ? ?Family History:  The patient's family history includes Arthritis in his father; Cirrhosis in his brother; Colon polyps in his brother; Heart disease in his father and mother; Hyperlipidemia in his brother; Hypertension in his brother and maternal aunt. ? ?ROS:  Please see the history of present illness.    ?All other systems are reviewed and otherwise negative.  ? ?PHYSICAL EXAM:  ?VS:  There were no vitals taken for this visit. BMI: There is no height or weight on file to calculate BMI. ?Well nourished, well developed, in no acute distress ?HEENT: normocephalic, atraumatic ?Neck: no JVD, carotid bruits or masses ?Cardiac:  irreg-irreg; no significant murmurs, no rubs, or gallops ?Lungs:  CTA b/l, no wheezing, rhonchi or rales ?Abd: soft, nontender ?MS: no deformity or atrophy ?Ext: no edema ?Skin: warm and dry, no rash ?Neuro:  No gross deficits appreciated ?Psych: euthymic mood, full affect ? ? ?EKG:  Done today and reviewed by myself shows  ?AFlutter,looks typical, 60bpm ? ? ?01/11/2022: TTE ?1. Left ventricular ejection fraction, by estimation, is 50 to 55%. The  ?left ventricle has low normal function. The left ventricle has no regional  ?wall motion abnormalities. There is mild left ventricular hypertrophy of  ?the basal-septal segment. Left  ?ventricular diastolic function could not be evaluated.  ? 2. Right ventricular systolic function is normal. The right ventricular  ?size is normal. There is normal pulmonary artery systolic pressure. The  ?estimated right ventricular systolic pressure is 99991111 mmHg.  ? 3. The mitral valve has been repaired/replaced. Mild mitral valve  ?regurgitation. No evidence of mitral stenosis. The mean mitral valve  ?gradient is 5.0 mmHg at 65bpm. There is a 32 mm Edwards prosthetic  ?annuloplasty ring present in the mitral position.  ? 4. The aortic valve has an  indeterminant number of cusps. Aortic valve  ?regurgitation is not visualized. Mild to moderate aortic valve  ?sclerosis/calcification is present, without any evidence of aortic  ?stenosis. Aortic valve area, by VTI measures 2.28  ?cm?Marland Kitchen Aortic valve mean gradient measures 5.0 mmHg. Aortic valve Vmax  ?measures 1.49 m/s. DI 0.5.  ? 5. Aortic dilatation noted. There is mild dilatation of the aortic root,  ?measuring 38 mm.  ? 6. The inferior vena cava is normal in size with greater than 50%  ?respiratory variability, suggesting right atrial pressure of 3 mmHg.  ? ?Recent Labs: ?01/11/2022: ALT 17; BUN 16; Creatinine, Ser 1.15; Magnesium 2.1; Potassium 4.9; Sodium 139; TSH 1.080 ?01/18/2022: Hemoglobin 14.9; Platelets 124  ?01/11/2022: Chol/HDL Ratio 3.1; Cholesterol, Total 120; HDL 39; LDL Chol Calc (NIH) 63; Triglycerides 91  ? ?CrCl cannot be calculated (Unknown ideal weight.).  ? ?Wt Readings  from Last 3 Encounters:  ?01/09/22 224 lb (101.6 kg)  ?01/01/22 223 lb 12.8 oz (101.5 kg)  ?05/02/21 219 lb 12.8 oz (99.7 kg)  ?  ? ?Other studies reviewed: ?Additional studies/records reviewed today include: summarized above ? ?ASSESSMENT AND PLAN: ? ?New aflutter ?Suspect this is a typical flutter ?CHA2DS2Vasc is 3, on Eliquis ?Appears typical, no symptoms ?Tolerating eliquis ?Discussed perhaps ablation therapy as management strategy, if successful, likely able to stop eleius a month or so afterwards. ?He would like to discuss further with dr. Lovena Le, interested inf thought to be a good candidate ? ?2. VHD s/p MV repair 2010 ?Working well ?C/w Dr. Claiborne Billings ? ?Disposition: F/u with Dr. Lovena Le ? ?Current medicines are reviewed at length with the patient today.  The patient did not have any concerns regarding medicines. ? ?Signed, ?Tommye Standard, PA-C ?01/27/2022 6:21 PM    ? ?[facility] ?[address] ?[address] ?[place] [address] ?[phone] (office)  ?[phone] (fax) ? ? ?

## 2022-01-31 ENCOUNTER — Telehealth: Payer: Self-pay | Admitting: Cardiovascular Disease

## 2022-01-31 ENCOUNTER — Ambulatory Visit: Payer: Medicare Other | Admitting: Physician Assistant

## 2022-01-31 ENCOUNTER — Other Ambulatory Visit: Payer: Self-pay

## 2022-01-31 ENCOUNTER — Encounter: Payer: Self-pay | Admitting: Physician Assistant

## 2022-01-31 VITALS — BP 144/88 | HR 76 | Ht 75.0 in | Wt 224.6 lb

## 2022-01-31 DIAGNOSIS — I483 Typical atrial flutter: Secondary | ICD-10-CM | POA: Diagnosis not present

## 2022-01-31 DIAGNOSIS — Z9889 Other specified postprocedural states: Secondary | ICD-10-CM | POA: Diagnosis not present

## 2022-01-31 NOTE — Patient Instructions (Signed)
Medication Instructions:  ? ?Your physician recommends that you continue on your current medications as directed. Please refer to the Current Medication list given to you today. ? ? ? ?*If you need a refill on your cardiac medications before your next appointment, please call your pharmacy* ? ? ?Lab Work: NONE ORDERED  TODAY ? ? ?If you have labs (blood work) drawn today and your tests are completely normal, you will receive your results only by: ?MyChart Message (if you have MyChart) OR ?A paper copy in the mail ?If you have any lab test that is abnormal or we need to change your treatment, we will call you to review the results. ? ? ?Testing/Procedures: NONE ORDERED  TODAY ? ? ? ?Follow-Up: ?At [facility], you and your health needs are our priority.  As part of our continuing mission to provide you with exceptional heart care, we have created designated Provider Care Teams.  These Care Teams include your primary Cardiologist (physician) and Advanced Practice Providers (APPs -  Physician Assistants and Nurse Practitioners) who all work together to provide you with the care you need, when you need it. ? ?We recommend signing up for the patient portal called "MyChart".  Sign up information is provided on this After Visit Summary.  MyChart is used to connect with patients for Virtual Visits (Telemedicine).  Patients are able to view lab/test results, encounter notes, upcoming appointments, etc.  Non-urgent messages can be sent to your provider as well.   ?To learn more about what you can do with MyChart, go to [email or web address].   ? ?Your next appointment:   ?1 month(s) ? ?The format for your next appointment:   ?In Person ? ?Provider:   ?Lewayne Bunting, MD{ ? ? ? ?Other Instructions ? ?

## 2022-01-31 NOTE — Telephone Encounter (Signed)
Patient called and wanted to know if Dr. Tresa Endo would be able to write him a letter to clear him to go back to work.  ?

## 2022-01-31 NOTE — Telephone Encounter (Signed)
Spoke to patient  . Patient needs this letter  for DO to return to work - patient has been out of work for 2 weeks . ? ?Patient saw Shara Blazing PA told she referred  this question back to Dr Tresa Endo. Per patient  she did not think his aflutter will hinder patient from returning to work. ?Patient aware will  defer to Dr Tresa Endo. ? ?

## 2022-02-01 NOTE — Addendum Note (Signed)
Addended by: Janan Halter F on: 02/01/2022 06:14 PM ? ? Modules accepted: Orders ? ?

## 2022-02-01 NOTE — Telephone Encounter (Signed)
Note typed, and sent to patients mychart account-  ?Will notify patient.  ? ?Thanks! ?

## 2022-02-01 NOTE — Telephone Encounter (Signed)
OK to return to work and drive ?

## 2022-02-04 ENCOUNTER — Other Ambulatory Visit: Payer: Self-pay

## 2022-02-04 MED ORDER — APIXABAN 5 MG PO TABS: 5.0000 mg | ORAL_TABLET | Freq: Two times a day (BID) | ORAL | 11 refills | Status: DC

## 2022-02-07 ENCOUNTER — Other Ambulatory Visit: Payer: Self-pay | Admitting: Cardiovascular Disease

## 2022-02-18 ENCOUNTER — Ambulatory Visit: Payer: Medicare Other | Admitting: Internal Medicine

## 2022-02-18 ENCOUNTER — Encounter: Payer: Self-pay | Admitting: Internal Medicine

## 2022-02-18 DIAGNOSIS — I483 Typical atrial flutter: Secondary | ICD-10-CM | POA: Diagnosis not present

## 2022-02-18 DIAGNOSIS — I4892 Unspecified atrial flutter: Secondary | ICD-10-CM

## 2022-02-18 NOTE — Progress Notes (Signed)
? ? ? ? ?HPI ?[name] is referred today by Renee Ursuy, PA-C for evaluation of atrial flutter. 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 He has had worsening dyspnea with exertion and been found to have atrial flutter with a controlled VR. He has a h/o heart block, mostly at night. The patient denies chest pain. He gets sob with exertion since his heart has been out of rhythm.  ?No Known Allergies ? ? ?Current Outpatient Medications  ?Medication Sig Dispense Refill  ? amoxicillin (AMOXIL) 500 MG capsule Take 500 mg by mouth 3 (three) times daily. Take before dentist    ? apixaban (ELIQUIS) 5 MG TABS tablet Take 1 tablet (5 mg total) by mouth 2 (two) times daily. 60 tablet 11  ? atorvastatin (LIPITOR) 40 MG tablet TAKE 1 TABLET BY MOUTH  DAILY 90 tablet 3  ? benazepril (LOTENSIN) 40 MG tablet TAKE 1 TABLET BY MOUTH  DAILY 90 tablet 3  ? ezetimibe (ZETIA) 10 MG tablet Take 1 tablet (10 mg total) by mouth daily. 90 tablet 3  ? ibuprofen (ADVIL) 600 MG tablet Take 600 mg by mouth every 6 (six) hours as needed.    ? metoprolol tartrate (LOPRESSOR) 25 MG tablet Take 1/2 to 1 tablet daily as needed for palpitations 180 tablet 0  ? Omega-3 Fatty Acids (FISH OIL) 1000 MG CAPS Take 2 capsules by mouth daily.    ? omeprazole (PRILOSEC) 40 MG capsule TAKE ONE CAPSULE BY MOUTH EVERY DAY 30 capsule 6  ? ?No current facility-administered medications for this visit.  ? ? ? ?Past Medical History:  ?Diagnosis Date  ? Dyslipidemia   ? Endocarditis   ? GERD (gastroesophageal reflux disease)   ? Heart murmur   ? History of stress test 11/22/2010  ? Perfusion defect in the inferior myocardial region is consistent with diaphragmatic attenuation. The remaining myocardium demonstrates normal myocardial perfusion with no evidence of ischemia or infarct. The post stress left ventricle is normal in size. The post stress EF 65%, Gobal left ventricle systolic function is normal. No significant wall  motion abnormalities noted. Normal Myocardial perfus  ? Hx of echocardiogram 02/10/2013  ? The cavity size was normal. Ststolic function was normal. The estimated EF was in the range 55%-60%. Wall motion was normal; there was no regional wall motion abnormalities. Stable MV repair & annuloplasty ring. Trace MR  ? Hyperlipidemia   ? Hypertension   ? OSA on CPAP   ? SBE (subacute bacterial endocarditis)   ? Sleep apnea   ? Stroke (HCC)   ? 3/10 endocarditis/embolic  ? ? ?ROS: ? ? All systems reviewed and negative except as noted in the HPI. ? ? ?Past Surgical History:  ?Procedure Laterality Date  ? CARDIAC CATHETERIZATION  01/2009  ? COLONOSCOPY    ? HERNIA REPAIR    ? right abd  ? INGUINAL HERNIA REPAIR    ? left side  ? MITRAL VALVE REPAIR  04/2009  ? 32mm Edwards annuloplasty ring and quadrangular resection pf P2 segment of the motral valve by Dr Hendrickson with normal coronary arteries.  ? POLYPECTOMY    ? UPPER GASTROINTESTINAL ENDOSCOPY    ? ? ? ?Family History  ?Problem Relation Age of Onset  ? Heart disease Mother   ? Heart disease Father   ? Arthritis Father   ? Cirrhosis Brother   ? Hypertension Maternal Aunt   ? Colon polyps Brother   ?   Hypertension Brother   ? Hyperlipidemia Brother   ? Colon cancer Neg Hx   ? Esophageal cancer Neg Hx   ? Stomach cancer Neg Hx   ? Rectal cancer Neg Hx   ? ? ? ?Social History  ? ?Socioeconomic History  ? Marital status: Married  ?  Spouse name: Not on file  ? Number of children: 2  ? Years of education: BS  ? Highest education level: Not on file  ?Occupational History  ? Occupation: Retired  ?  Employer: DISABLED  ? Occupation: [employer]  ?Tobacco Use  ? Smoking status: Former  ?  Packs/day: 2.00  ?  Years: 20.00  ?  Pack years: 40.00  ?  Types: Cigarettes  ?  Quit date: 04/29/1981  ?  Years since quitting: 40.8  ? Smokeless tobacco: Never  ?Vaping Use  ? Vaping Use: Never used  ?Substance and Sexual Activity  ? Alcohol use: Yes  ?  Alcohol/week: 14.0 standard drinks  ?   Types: 14 Standard drinks or equivalent per week  ?  Comment: 2 glasses wine every day  ? Drug use: No  ? Sexual activity: Not on file  ?Other Topics Concern  ? Not on file  ?Social History Narrative  ? Not on file  ? ?Social Determinants of Health  ? ?Financial Resource Strain: Not on file  ?Food Insecurity: Not on file  ?Transportation Needs: Not on file  ?Physical Activity: Not on file  ?Stress: Not on file  ?Social Connections: Not on file  ?Intimate Partner Violence: Not on file  ? ? ? ?BP (!) 144/82   Pulse (!) 57   Ht 6' 3" (1.905 m)   Wt 225 lb 12.8 oz (102.4 kg)   SpO2 99%   BMI 28.22 kg/m?  ? ?Physical Exam: ? ?Well appearing NAD ?HEENT: Unremarkable ?Neck:  No JVD, no thyromegally ?Lymphatics:  No adenopathy ?Back:  No CVA tenderness ?Lungs:  Clear with no wheezes ?HEART:  IRegular rate rhythm, no murmurs, no rubs, no clicks ?Abd:  soft, positive bowel sounds, no organomegally, no rebound, no guarding ?Ext:  2 plus pulses, no edema, no cyanosis, no clubbing ?Skin:  No rashes no nodules ?Neuro:  CN II through XII intact, motor grossly intact ? ?EKG - atrial flutter with a CVR ? ?Assess/Plan:  ?Typical atrial flutter - as he is symptomatic and is on systemic anti-coagulation I have recommended proceeding with EP study and catheter ablation. I have reviewed the indications/risks/benefits/ and he wishes to proceed.  ?MR - he is s/p surgical repair and is doing well.  ?Coags - he has not had any bleeding on eliquis.  ?HTN -his bp is controlled. He may need more medication for his bp in the future. ? ?Carisha Kantor,MD ?

## 2022-02-18 NOTE — H&P (View-Only) (Signed)
? ? ? ? ?HPI ?Edgar Jones is referred today by Tommye Standard, PA-C for evaluation of atrial flutter. 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 He has had worsening dyspnea with exertion and been found to have atrial flutter with a controlled VR. He has a h/o heart block, mostly at night. The patient denies chest pain. He gets sob with exertion since his heart has been out of rhythm.  ?No Known Allergies ? ? ?Current Outpatient Medications  ?Medication Sig Dispense Refill  ? amoxicillin (AMOXIL) 500 MG capsule Take 500 mg by mouth 3 (three) times daily. Take before dentist    ? apixaban (ELIQUIS) 5 MG TABS tablet Take 1 tablet (5 mg total) by mouth 2 (two) times daily. 60 tablet 11  ? atorvastatin (LIPITOR) 40 MG tablet TAKE 1 TABLET BY MOUTH  DAILY 90 tablet 3  ? benazepril (LOTENSIN) 40 MG tablet TAKE 1 TABLET BY MOUTH  DAILY 90 tablet 3  ? ezetimibe (ZETIA) 10 MG tablet Take 1 tablet (10 mg total) by mouth daily. 90 tablet 3  ? ibuprofen (ADVIL) 600 MG tablet Take 600 mg by mouth every 6 (six) hours as needed.    ? metoprolol tartrate (LOPRESSOR) 25 MG tablet Take 1/2 to 1 tablet daily as needed for palpitations 180 tablet 0  ? Omega-3 Fatty Acids (FISH OIL) 1000 MG CAPS Take 2 capsules by mouth daily.    ? omeprazole (PRILOSEC) 40 MG capsule TAKE ONE CAPSULE BY MOUTH EVERY DAY 30 capsule 6  ? ?No current facility-administered medications for this visit.  ? ? ? ?Past Medical History:  ?Diagnosis Date  ? Dyslipidemia   ? Endocarditis   ? GERD (gastroesophageal reflux disease)   ? Heart murmur   ? History of stress test 11/22/2010  ? Perfusion defect in the inferior myocardial region is consistent with diaphragmatic attenuation. The remaining myocardium demonstrates normal myocardial perfusion with no evidence of ischemia or infarct. The post stress left ventricle is normal in size. The post stress EF 65%, Gobal left ventricle systolic function is normal. No significant wall  motion abnormalities noted. Normal Myocardial perfus  ? Hx of echocardiogram 02/10/2013  ? The cavity size was normal. Ststolic function was normal. The estimated EF was in the range 55%-60%. Wall motion was normal; there was no regional wall motion abnormalities. Stable MV repair & annuloplasty ring. Trace MR  ? Hyperlipidemia   ? Hypertension   ? OSA on CPAP   ? SBE (subacute bacterial endocarditis)   ? Sleep apnea   ? Stroke [facility])   ? 3/10 endocarditis/embolic  ? ? ?ROS: ? ? All systems reviewed and negative except as noted in the HPI. ? ? ?Past Surgical History:  ?Procedure Laterality Date  ? CARDIAC CATHETERIZATION  01/2009  ? COLONOSCOPY    ? HERNIA REPAIR    ? right abd  ? INGUINAL HERNIA REPAIR    ? left side  ? MITRAL VALVE REPAIR  04/2009  ? 52mm Edwards annuloplasty ring and quadrangular resection pf P2 segment of the motral valve by Dr Roxan Hockey with normal coronary arteries.  ? POLYPECTOMY    ? UPPER GASTROINTESTINAL ENDOSCOPY    ? ? ? ?Family History  ?Problem Relation Age of Onset  ? Heart disease Mother   ? Heart disease Father   ? Arthritis Father   ? Cirrhosis Brother   ? Hypertension Maternal Aunt   ? Colon polyps Brother   ?  Hypertension Brother   ? Hyperlipidemia Brother   ? Colon cancer Neg Hx   ? Esophageal cancer Neg Hx   ? Stomach cancer Neg Hx   ? Rectal cancer Neg Hx   ? ? ? ?Social History  ? ?Socioeconomic History  ? Marital status: Married  ?  Spouse name: Not on file  ? Number of children: 2  ? Years of education: BS  ? Highest education level: Not on file  ?Occupational History  ? Occupation: Retired  ?  Employer: DISABLED  ? Occupation: [employer]  ?Tobacco Use  ? Smoking status: Former  ?  Packs/day: 2.00  ?  Years: 20.00  ?  Pack years: 40.00  ?  Types: Cigarettes  ?  Quit date: 04/29/1981  ?  Years since quitting: 40.8  ? Smokeless tobacco: Never  ?Vaping Use  ? Vaping Use: Never used  ?Substance and Sexual Activity  ? Alcohol use: Yes  ?  Alcohol/week: 14.0 standard drinks  ?   Types: 14 Standard drinks or equivalent per week  ?  Comment: 2 glasses wine every day  ? Drug use: No  ? Sexual activity: Not on file  ?Other Topics Concern  ? Not on file  ?Social History Narrative  ? Not on file  ? ?Social Determinants of Health  ? ?Financial Resource Strain: Not on file  ?Food Insecurity: Not on file  ?Transportation Needs: Not on file  ?Physical Activity: Not on file  ?Stress: Not on file  ?Social Connections: Not on file  ?Intimate Partner Violence: Not on file  ? ? ? ?BP (!) 144/82   Pulse (!) 57   Ht 6\' 3"  (1.905 m)   Wt 225 lb 12.8 oz (102.4 kg)   SpO2 99%   BMI 28.22 kg/m?  ? ?Physical Exam: ? ?Well appearing NAD ?HEENT: Unremarkable ?Neck:  No JVD, no thyromegally ?Lymphatics:  No adenopathy ?Back:  No CVA tenderness ?Lungs:  Clear with no wheezes ?HEART:  IRegular rate rhythm, no murmurs, no rubs, no clicks ?Abd:  soft, positive bowel sounds, no organomegally, no rebound, no guarding ?Ext:  2 plus pulses, no edema, no cyanosis, no clubbing ?Skin:  No rashes no nodules ?Neuro:  CN II through XII intact, motor grossly intact ? ?EKG - atrial flutter with a CVR ? ?Assess/Plan:  ?Typical atrial flutter - as he is symptomatic and is on systemic anti-coagulation I have recommended proceeding with EP study and catheter ablation. I have reviewed the indications/risks/benefits/ and he wishes to proceed.  ?MR - he is s/p surgical repair and is doing well.  ?Coags - he has not had any bleeding on eliquis.  ?HTN -his bp is controlled. He may need more medication for his bp in the future. ? ?Edgar Overlie Letonya Mangels,MD ?

## 2022-02-18 NOTE — Patient Instructions (Addendum)
Medication Instructions:  ?Your physician recommends that you continue on your current medications as directed. Please refer to the Current Medication list given to you today. ? ?Labwork: ?You will get lab work today:  CBC and BMP ? ?Testing/Procedures: ?Your physician has recommended that you have an ablation. Catheter ablation is a medical procedure used to treat some cardiac arrhythmias (irregular heartbeats). During catheter ablation, a long, thin, flexible tube is put into a blood vessel in your groin (upper thigh), or neck. This tube is called an ablation catheter. It is then guided to your heart through the blood vessel. Radio frequency waves destroy small areas of heart tissue where abnormal heartbeats may cause an arrhythmia to start. Please see the instruction sheet given to you today. ? ?Follow-Up: ? ?SEE INSTRUCTION LETTER ? ?Any Other Special Instructions Will Be Listed Below (If Applicable). ? ?If you need a refill on your cardiac medications before your next appointment, please call your pharmacy.  ? ?Important Information About Sugar ? ? ? ? ? ? ?Cardiac Ablation ?Cardiac ablation is a procedure to destroy, or ablate, a small amount of heart tissue in very specific places. The heart has many electrical connections. Sometimes these connections are abnormal and can cause the heart to beat very fast or irregularly. Ablating some of the areas that cause problems can improve the heart's rhythm or return it to normal. Ablation may be done for people who: ?Have Wolff-Parkinson-White syndrome. ?Have fast heart rhythms (tachycardia). ?Have taken medicines for an abnormal heart rhythm (arrhythmia) that were not effective or caused side effects. ?Have a high-risk heartbeat that may be life-threatening. ?During the procedure, a small incision is made in the neck or the groin, and a long, thin tube (catheter) is inserted into the incision and moved to the heart. Small devices (electrodes) on the tip of the  catheter will send out electrical currents. A type of X-ray (fluoroscopy) will be used to help guide the catheter and to provide images of the heart. ?Tell a health care provider about: ?Any allergies you have. ?All medicines you are taking, including vitamins, herbs, eye drops, creams, and over-the-counter medicines. ?Any problems you or family members have had with anesthetic medicines. ?Any blood disorders you have. ?Any surgeries you have had. ?Any medical conditions you have, such as kidney failure. ?Whether you are pregnant or may be pregnant. ?What are the risks? ?Generally, this is a safe procedure. However, problems may occur, including: ?Infection. ?Bruising and bleeding at the catheter insertion site. ?Bleeding into the chest, especially into the sac that surrounds the heart. This is a serious complication. ?Stroke or blood clots. ?Damage to nearby structures or organs. ?Allergic reaction to medicines or dyes. ?Need for a permanent pacemaker if the normal electrical system is damaged. A pacemaker is a small computer that sends electrical signals to the heart and helps your heart beat normally. ?The procedure not being fully effective. This may not be recognized until months later. Repeat ablation procedures are sometimes done. ?What happens before the procedure? ?Medicines ?Ask your health care provider about: ?Changing or stopping your regular medicines. This is especially important if you are taking diabetes medicines or blood thinners. ?Taking medicines such as aspirin and ibuprofen. These medicines can thin your blood. Do not take these medicines unless your health care provider tells you to take them. ?Taking over-the-counter medicines, vitamins, herbs, and supplements. ?General instructions ?Follow instructions from your health care provider about eating or drinking restrictions. ?Plan to have someone take you home from  the hospital or clinic. ?If you will be going home right after the procedure,  plan to have someone with you for 24 hours. ?Ask your health care provider what steps will be taken to prevent infection. ?What happens during the procedure? ? ?An IV will be inserted into one of your veins. ?You will be given a medicine to help you relax (sedative). ?The skin on your neck or groin will be numbed. ?An incision will be made in your neck or your groin. ?A needle will be inserted through the incision and into a large vein in your neck or groin. ?A catheter will be inserted into the needle and moved to your heart. ?Dye may be injected through the catheter to help your surgeon see the area of the heart that needs treatment. ?Electrical currents will be sent from the catheter to ablate heart tissue in desired areas. There are three types of energy that may be used to do this: ?Heat (radiofrequency energy). ?Laser energy. ?Extreme cold (cryoablation). ?When the tissue has been ablated, the catheter will be removed. ?Pressure will be held on the insertion area to prevent a lot of bleeding. ?A bandage (dressing) will be placed over the insertion area. ?The exact procedure may vary among health care providers and hospitals. ?What happens after the procedure? ?Your blood pressure, heart rate, breathing rate, and blood oxygen level will be monitored until you leave the hospital or clinic. ?Your insertion area will be monitored for bleeding. You will need to lie still for a few hours to ensure that you do not bleed from the insertion area. ?Do not drive for 24 hours or as long as told by your health care provider. ?Summary ?Cardiac ablation is a procedure to destroy, or ablate, a small amount of heart tissue using an electrical current. This procedure can improve the heart rhythm or return it to normal. ?Tell your health care provider about any medical conditions you may have and all medicines you are taking to treat them. ?This is a safe procedure, but problems may occur. Problems may include infection,  bruising, damage to nearby organs or structures, or allergic reactions to medicines. ?Follow your health care provider's instructions about eating and drinking before the procedure. You may also be told to change or stop some of your medicines. ?After the procedure, do not drive for 24 hours or as long as told by your health care provider. ?This information is not intended to replace advice given to you by your health care provider. Make sure you discuss any questions you have with your health care provider. ?Document Revised: 09/06/2019 Document Reviewed: 09/06/2019 ?Elsevier Patient Education ? 2022 [employer] ? ?

## 2022-02-19 LAB — BASIC METABOLIC PANEL
BUN/Creatinine Ratio: 11 (ref 10–24)
BUN: 12 mg/dL (ref 8–27)
CO2: 22 mmol/L (ref 20–29)
Calcium: 9.1 mg/dL (ref 8.6–10.2)
Chloride: 108 mmol/L — ABNORMAL HIGH (ref 96–106)
Creatinine, Ser: 1.08 mg/dL (ref 0.76–1.27)
Glucose: 87 mg/dL (ref 70–99)
Potassium: 4.6 mmol/L (ref 3.5–5.2)
Sodium: 144 mmol/L (ref 134–144)
eGFR: 71 mL/min/{1.73_m2} (ref >59)

## 2022-02-19 LAB — CBC WITH DIFFERENTIAL/PLATELET
Basophils Absolute: 0.1 10*3/uL (ref 0.0–0.2)
Basos: 1 %
EOS (ABSOLUTE): 0.1 10*3/uL (ref 0.0–0.4)
Eos: 2 %
Hematocrit: 43.9 % (ref 37.5–51.0)
Hemoglobin: 14.5 g/dL (ref 13.0–17.7)
Immature Grans (Abs): 0 10*3/uL (ref 0.0–0.1)
Immature Granulocytes: 0 %
Lymphocytes Absolute: 2.1 10*3/uL (ref 0.7–3.1)
Lymphs: 30 %
MCH: 30.3 pg (ref 26.6–33.0)
MCHC: 33 g/dL (ref 31.5–35.7)
MCV: 92 fL (ref 79–97)
Monocytes Absolute: 0.7 10*3/uL (ref 0.1–0.9)
Monocytes: 10 %
Neutrophils Absolute: 3.8 10*3/uL (ref 1.4–7.0)
Neutrophils: 57 %
Platelets: 109 10*3/uL — ABNORMAL LOW (ref 150–450)
RBC: 4.78 x10E6/uL (ref 4.14–5.80)
RDW: 12.3 % (ref 11.6–15.4)
WBC: 6.8 10*3/uL (ref 3.4–10.8)

## 2022-03-15 ENCOUNTER — Telehealth: Payer: Self-pay | Admitting: Internal Medicine

## 2022-03-15 NOTE — Telephone Encounter (Signed)
Advised patient how to get to the instructions in his mychart account in letters and also will sent a copy in a message as well.  ?Patient verbalized understanding and agreement.  ?

## 2022-03-15 NOTE — Telephone Encounter (Signed)
Patient is having an ablation on Tuesday, he was given instruction but he has misplaced them.  He would like another set of instructions. He said he can stop by the office to pick them up.  ?

## 2022-03-18 NOTE — Pre-Procedure Instructions (Signed)
Attempted to call patient regarding procedure instructions for tomorrow.  Left voice mail on the following items: ?Arrival time 0530 ?Nothing to eat or drink after midnight ?No meds AM of procedure ?Responsible person to drive you home and stay with you for 24 hrs ? ?Have you missed any doses of anti-coagulant Take Eliquis twice today, do not take morning dose in the AM ?   ?

## 2022-03-18 NOTE — Anesthesia Preprocedure Evaluation (Addendum)
Anesthesia Evaluation  ?Patient identified by MRN, date of birth, ID band ?Patient awake ? ? ? ?Reviewed: ?Allergy & Precautions, NPO status , Patient's Chart, lab work & pertinent test results ? ?Airway ?Mallampati: II ? ?TM Distance: >3 FB ?Neck ROM: Full ? ? ? Dental ?no notable dental hx. ? ?  ?Pulmonary ?sleep apnea and Continuous Positive Airway Pressure Ventilation , former smoker,  ?  ?Pulmonary exam normal ? ? ? ? ? ? ? Cardiovascular ?hypertension, Pt. on medications and Pt. on home beta blockers ?+ dysrhythmias Atrial Fibrillation  ?Rhythm:Irregular Rate:Normal ? ? ?  ?Neuro/Psych ?CVA negative psych ROS  ? GI/Hepatic ?Neg liver ROS, GERD  ,  ?Endo/Other  ?negative endocrine ROS ? Renal/GU ?negative Renal ROS  ? ?  ?Musculoskeletal ?negative musculoskeletal ROS ?(+)  ? Abdominal ?Normal abdominal exam  (+)   ?Peds ? Hematology ? ?(+) Blood dyscrasia, anemia ,   ?Anesthesia Other Findings ? ? Reproductive/Obstetrics ? ?  ? ? ? ? ? ? ? ? ? ? ? ? ? ?  ?  ? ? ? ? ? ? ? ?Anesthesia Physical ?Anesthesia Plan ? ?ASA: 3 ? ?Anesthesia Plan: General  ? ?Post-op Pain Management:   ? ?Induction: Intravenous ? ?PONV Risk Score and Plan: 2 and Ondansetron, Dexamethasone and Treatment may vary due to age or medical condition ? ?Airway Management Planned: Mask and Oral ETT ? ?Additional Equipment: None ? ?Intra-op Plan:  ? ?Post-operative Plan: Extubation in OR ? ?Informed Consent: I have reviewed the patients History and Physical, chart, labs and discussed the procedure including the risks, benefits and alternatives for the proposed anesthesia with the patient or authorized representative who has indicated his/her understanding and acceptance.  ? ? ? ?Dental advisory given ? ?Plan Discussed with: CRNA ? ?Anesthesia Plan Comments: (Lab Results ?     Component                Value               Date                 ?     WBC                      6.8                 02/18/2022           ?      HGB                      14.5                02/18/2022           ?     HCT                      43.9                02/18/2022           ?     MCV                      92                  02/18/2022           ?     PLT  109 (L)             02/18/2022           ?Lab Results ?     Component                Value               Date                 ?     NA                       144                 02/18/2022           ?     K                        4.6                 02/18/2022           ?     CO2                      22                  02/18/2022           ?     GLUCOSE                  87                  02/18/2022           ?     BUN                      12                  02/18/2022           ?     CREATININE               1.08                02/18/2022           ?     CALCIUM                  9.1                 02/18/2022           ?     EGFR                     71                  02/18/2022           ?     GFRNONAA                 63                  11/02/2020           ?ECHO 03/22: ??1. Left ventricular ejection fraction, by estimation, is 50 to 55%. The  ?left ventricle has low normal function. The left ventricle has no regional  ?wall motion abnormalities. There is mild left ventricular hypertrophy of  ?  the basal-septal segment. Left  ?ventricular diastolic function could not be evaluated.  ??2. Right ventricular systolic function is normal. The right ventricular  ?size is normal. There is normal pulmonary artery systolic pressure. The  ?estimated right ventricular systolic pressure is 02.5 mmHg.  ??3. The mitral valve has been repaired/replaced. Mild mitral valve  ?regurgitation. No evidence of mitral stenosis. The mean mitral valve  ?gradient is 5.0 mmHg at 65bpm. There is a 32 mm Edwards prosthetic  ?annuloplasty ring present in the mitral position.  ??4. The aortic valve has an indeterminant number of cusps. Aortic valve  ?regurgitation is not visualized. Mild to moderate  aortic valve  ?sclerosis/calcification is present, without any evidence of aortic  ?stenosis. Aortic valve area, by VTI measures 2.28  ?cm?Marland Kitchen Aortic valve mean gradient measures 5.0 mmHg. Aortic valve Vmax  ?measures 1.49 m/s. DI 0.5.  ??5. Aortic dilatation noted. There is mild dilatation of the aortic root,  ?measuring 38 mm.  ??6. The inferior vena cava is normal in size with greater than 50%  ?respiratory variability, suggesting right atrial pressure of 3 mmHg. )  ? ? ? ? ? ?Anesthesia Quick Evaluation ? ?

## 2022-03-19 ENCOUNTER — Ambulatory Visit (HOSPITAL_COMMUNITY): Payer: Medicare Other | Admitting: Anesthesiology

## 2022-03-19 ENCOUNTER — Encounter (HOSPITAL_COMMUNITY): Payer: Self-pay | Admitting: Internal Medicine

## 2022-03-19 ENCOUNTER — Ambulatory Visit (HOSPITAL_COMMUNITY)
Admission: RE | Admit: 2022-03-19 | Discharge: 2022-03-19 | Disposition: A | Discharge status: Home / Self Care | Payer: Medicare Other | Attending: Internal Medicine | Admitting: Internal Medicine

## 2022-03-19 ENCOUNTER — Other Ambulatory Visit: Payer: Self-pay

## 2022-03-19 ENCOUNTER — Ambulatory Visit (HOSPITAL_BASED_OUTPATIENT_CLINIC_OR_DEPARTMENT_OTHER): Payer: Medicare Other | Admitting: Anesthesiology

## 2022-03-19 ENCOUNTER — Encounter (HOSPITAL_COMMUNITY)
Admission: RE | Disposition: A | Discharge status: Home / Self Care | Payer: Self-pay | Source: Home / Self Care | Attending: Internal Medicine

## 2022-03-19 DIAGNOSIS — Z8673 Personal history of transient ischemic attack (TIA), and cerebral infarction without residual deficits: Secondary | ICD-10-CM | POA: Diagnosis not present

## 2022-03-19 DIAGNOSIS — Z7901 Long term (current) use of anticoagulants: Secondary | ICD-10-CM | POA: Insufficient documentation

## 2022-03-19 DIAGNOSIS — I4892 Unspecified atrial flutter: Secondary | ICD-10-CM

## 2022-03-19 DIAGNOSIS — Z87891 Personal history of nicotine dependence: Secondary | ICD-10-CM | POA: Insufficient documentation

## 2022-03-19 DIAGNOSIS — I10 Essential (primary) hypertension: Secondary | ICD-10-CM

## 2022-03-19 DIAGNOSIS — I4891 Unspecified atrial fibrillation: Secondary | ICD-10-CM | POA: Diagnosis not present

## 2022-03-19 DIAGNOSIS — I484 Atypical atrial flutter: Secondary | ICD-10-CM | POA: Diagnosis not present

## 2022-03-19 DIAGNOSIS — I483 Typical atrial flutter: Secondary | ICD-10-CM | POA: Diagnosis present

## 2022-03-19 HISTORY — PX: A-FLUTTER ABLATION: EP1230

## 2022-03-19 SURGERY — A-FLUTTER ABLATION
Anesthesia: General

## 2022-03-19 MED ORDER — ROCURONIUM BROMIDE 10 MG/ML (PF) SYRINGE
PREFILLED_SYRINGE | INTRAVENOUS | Status: DC | PRN
  Administered 2022-03-19: 10 mg via INTRAVENOUS
  Administered 2022-03-19: 30 mg via INTRAVENOUS
  Administered 2022-03-19: 60 mg via INTRAVENOUS

## 2022-03-19 MED ORDER — LIDOCAINE 2% (20 MG/ML) 5 ML SYRINGE
INTRAMUSCULAR | Status: DC | PRN
Start: 2022-03-19 — End: 2022-03-19
  Administered 2022-03-19: 40 mg via INTRAVENOUS

## 2022-03-19 MED ORDER — PROPOFOL 10 MG/ML IV BOLUS
INTRAVENOUS | Status: DC | PRN
  Administered 2022-03-19: 200 mg via INTRAVENOUS

## 2022-03-19 MED ORDER — ONDANSETRON HCL 4 MG/2ML IJ SOLN
INTRAMUSCULAR | Status: DC | PRN
  Administered 2022-03-19: 4 mg via INTRAVENOUS

## 2022-03-19 MED ORDER — HEPARIN (PORCINE) IN NACL 1000-0.9 UT/500ML-% IV SOLN
INTRAVENOUS | Status: DC | PRN
Start: 2022-03-19 — End: 2022-03-19
  Administered 2022-03-19: 500 mL

## 2022-03-19 MED ORDER — DEXAMETHASONE SODIUM PHOSPHATE 10 MG/ML IJ SOLN
INTRAMUSCULAR | Status: DC | PRN
  Administered 2022-03-19: 10 mg via INTRAVENOUS

## 2022-03-19 MED ORDER — SODIUM CHLORIDE 0.9 % IV SOLN: 250.0000 mL | INTRAVENOUS | Status: DC | PRN

## 2022-03-19 MED ORDER — FENTANYL CITRATE (PF) 100 MCG/2ML IJ SOLN
INTRAMUSCULAR | Status: DC | PRN
  Administered 2022-03-19: 25 ug via INTRAVENOUS
  Administered 2022-03-19: 50 ug via INTRAVENOUS
  Administered 2022-03-19: 25 ug via INTRAVENOUS

## 2022-03-19 MED ORDER — ACETAMINOPHEN 325 MG PO TABS
650.0000 mg | ORAL_TABLET | ORAL | Status: DC | PRN
  Filled 2022-03-19: qty 2

## 2022-03-19 MED ORDER — HEPARIN SODIUM (PORCINE) 1000 UNIT/ML IJ SOLN
INTRAMUSCULAR | Status: AC
  Filled 2022-03-19: qty 10

## 2022-03-19 MED ORDER — SUGAMMADEX SODIUM 200 MG/2ML IV SOLN
INTRAVENOUS | Status: DC | PRN
  Administered 2022-03-19: 200 mg via INTRAVENOUS

## 2022-03-19 MED ORDER — PHENYLEPHRINE HCL-NACL 20-0.9 MG/250ML-% IV SOLN
INTRAVENOUS | Status: DC | PRN
  Administered 2022-03-19: 20 ug/min via INTRAVENOUS

## 2022-03-19 MED ORDER — SODIUM CHLORIDE 0.9% FLUSH: 3.0000 mL | INTRAVENOUS | Status: DC | PRN

## 2022-03-19 MED ORDER — SODIUM CHLORIDE 0.9 % IV SOLN: INTRAVENOUS | Status: DC

## 2022-03-19 MED ORDER — HEPARIN (PORCINE) IN NACL 1000-0.9 UT/500ML-% IV SOLN
INTRAVENOUS | Status: AC
  Filled 2022-03-19: qty 500

## 2022-03-19 MED ORDER — HEPARIN SODIUM (PORCINE) 1000 UNIT/ML IJ SOLN
INTRAMUSCULAR | Status: DC | PRN
  Administered 2022-03-19: 1000 [IU] via INTRAVENOUS

## 2022-03-19 MED ORDER — SODIUM CHLORIDE 0.9% FLUSH: 3.0000 mL | Freq: Two times a day (BID) | INTRAVENOUS | Status: DC

## 2022-03-19 MED ORDER — ONDANSETRON HCL 4 MG/2ML IJ SOLN: 4.0000 mg | Freq: Four times a day (QID) | INTRAMUSCULAR | Status: DC | PRN

## 2022-03-19 SURGICAL SUPPLY — 10 items
CATH SMTCH THERMOCOOL SF FJ (CATHETERS) ×1 IMPLANT
CATH WEBSTER BI DIR CS D-F CRV (CATHETERS) ×1 IMPLANT
PACK EP LATEX FREE (CUSTOM PROCEDURE TRAY) ×2
PACK EP LF (CUSTOM PROCEDURE TRAY) ×1 IMPLANT
PAD DEFIB RADIO PHYSIO CONN (PAD) ×2 IMPLANT
PATCH CARTO3 (PAD) ×1 IMPLANT
SHEATH PINNACLE 6F 10CM (SHEATH) IMPLANT
SHEATH PINNACLE 7F 10CM (SHEATH) ×1 IMPLANT
SHEATH PINNACLE 8F 10CM (SHEATH) ×1 IMPLANT
TUBING SMART ABLATE COOLFLOW (TUBING) ×1 IMPLANT

## 2022-03-19 NOTE — Interval H&P Note (Signed)
History and Physical Interval Note: ? ?03/19/2022 ?7:25 AM ? ?Edgar Jones  has presented today for surgery, with the diagnosis of aflutter.  The various methods of treatment have been discussed with the patient and family. After consideration of risks, benefits and other options for treatment, the patient has consented to  Procedure(s): ?A-FLUTTER ABLATION (N/A) as a surgical intervention.  The patient's history has been reviewed, patient examined, no change in status, stable for surgery.  I have reviewed the patient's chart and labs.  Questions were answered to the patient's satisfaction.   ? ? ?Lewayne Bunting ? ? ?

## 2022-03-19 NOTE — Progress Notes (Signed)
Pt ambulated without difficulty or bleeding.   Discharged home with wife who will drive and stay with pt x 24 hrs 

## 2022-03-19 NOTE — Anesthesia Procedure Notes (Signed)
Procedure Name: Intubation ?Date/Time: 03/19/2022 7:45 AM ?Performed by: Kyung Rudd, CRNA ?Pre-anesthesia Checklist: Patient identified, Emergency Drugs available, Suction available and Patient being monitored ?Patient Re-evaluated:Patient Re-evaluated prior to induction ?Oxygen Delivery Method: Circle system utilized ?Preoxygenation: Pre-oxygenation with 100% oxygen ?Induction Type: IV induction ?Ventilation: Mask ventilation without difficulty ?Laryngoscope Size: Mac and 4 ?Grade View: Grade I ?Tube type: Oral ?Tube size: 7.5 mm ?Number of attempts: 1 ?Airway Equipment and Method: Stylet ?Placement Confirmation: ETT inserted through vocal cords under direct vision, positive ETCO2 and breath sounds checked- equal and bilateral ?Secured at: 22 cm ?Tube secured with: Tape ?Dental Injury: Teeth and Oropharynx as per pre-operative assessment  ? ? ? ? ?

## 2022-03-19 NOTE — Discharge Instructions (Signed)

## 2022-03-19 NOTE — Transfer of Care (Signed)
Immediate Anesthesia Transfer of Care Note ? ?Patient: Edgar Jones ? ?Procedure(s) Performed: A-FLUTTER ABLATION ? ?Patient Location: Cath Lab ? ?Anesthesia Type:General ? ?Level of Consciousness: awake, alert  and oriented ? ?Airway & Oxygen Therapy: Patient Spontanous Breathing and Patient connected to nasal cannula oxygen ? ?Post-op Assessment: Report given to RN, Post -op Vital signs reviewed and stable and Patient moving all extremities X 4 ? ?Post vital signs: Reviewed and stable ? ?Last Vitals:  ?Vitals Value Taken Time  ?BP 134/84 03/19/22 0936  ?Temp 36.4 ?C 03/19/22 0935  ?Pulse 49 03/19/22 0939  ?Resp 13 03/19/22 0939  ?SpO2 97 % 03/19/22 0939  ?Vitals shown include unvalidated device data. ? ?Last Pain:  ?Vitals:  ? 03/19/22 0935  ?TempSrc: Temporal  ?PainSc: Asleep  ?   ? ?  ? ?Complications: No notable events documented. ?

## 2022-03-19 NOTE — Progress Notes (Signed)
Site area: rt groin fv sheaths x2 ?Site Prior to Removal:  Level 0 ?Pressure Applied For: 15 minutes ?Manual:   yes ?Patient Status During Pull:  stable ?Post Pull Site:  Level 0 ?Post Pull Instructions Given: yes  ?Post Pull Pulses Present: rt dp palpable ?Dressing Applied:  gauze and tegaderm ?Bedrest begins @ 1000 ?Comments: ?  ?

## 2022-03-19 NOTE — Anesthesia Postprocedure Evaluation (Signed)
Anesthesia Post Note ? ?Patient: Edgar Jones ? ?Procedure(s) Performed: A-FLUTTER ABLATION ? ?  ? ?Patient location during evaluation: PACU ?Anesthesia Type: General ?Level of consciousness: awake and alert ?Pain management: pain level controlled ?Vital Signs Assessment: post-procedure vital signs reviewed and stable ?Respiratory status: spontaneous breathing, nonlabored ventilation, respiratory function stable and patient connected to nasal cannula oxygen ?Cardiovascular status: blood pressure returned to baseline and stable ?Postop Assessment: no apparent nausea or vomiting ?Anesthetic complications: no ? ? ?There were no known notable events for this encounter. ? ?Last Vitals:  ?Vitals:  ? 03/19/22 1100 03/19/22 1300  ?BP: 116/85 129/78  ?Pulse: (!) 59 (!) 59  ?Resp: 16 14  ?Temp:    ?SpO2: 95% 96%  ?  ?Last Pain:  ?Vitals:  ? 03/19/22 1025  ?TempSrc:   ?PainSc: 0-No pain  ? ? ?  ?  ?  ?  ?  ?  ? ?Nelle Don Stephaney Steven ? ? ? ? ?

## 2022-03-20 ENCOUNTER — Encounter (HOSPITAL_COMMUNITY): Payer: Self-pay | Admitting: Internal Medicine

## 2022-03-30 ENCOUNTER — Other Ambulatory Visit: Payer: Self-pay | Admitting: Cardiovascular Disease

## 2022-04-29 ENCOUNTER — Ambulatory Visit: Payer: Medicare Other | Admitting: Internal Medicine

## 2022-04-29 ENCOUNTER — Encounter: Payer: Self-pay | Admitting: Internal Medicine

## 2022-04-29 VITALS — BP 122/74 | HR 67 | Ht 75.0 in | Wt 219.0 lb

## 2022-04-29 DIAGNOSIS — R001 Bradycardia, unspecified: Secondary | ICD-10-CM | POA: Diagnosis not present

## 2022-04-29 DIAGNOSIS — I4892 Unspecified atrial flutter: Secondary | ICD-10-CM | POA: Diagnosis not present

## 2022-04-29 MED ORDER — ASPIRIN 81 MG PO TBEC: 81.0000 mg | DELAYED_RELEASE_TABLET | Freq: Every day | ORAL | 3 refills | Status: AC

## 2022-04-29 NOTE — Progress Notes (Signed)
HPI Edgar Jones returns today for followup of atrial flutter. He underwent catheter ablation 4 weeks ago and is doing well in the interim. He denies chest pain or sob. No syncope. No additional palpitations.  No Known Allergies   Current Outpatient Medications  Medication Sig Dispense Refill   amoxicillin (AMOXIL) 500 MG capsule Take 2,000 mg by mouth See admin instructions. Take before dentist     aspirin EC 81 MG tablet Take 1 tablet (81 mg total) by mouth daily. Swallow whole. 90 tablet 3   atorvastatin (LIPITOR) 40 MG tablet TAKE 1 TABLET BY MOUTH  DAILY 90 tablet 3   benazepril (LOTENSIN) 40 MG tablet TAKE 1 TABLET BY MOUTH  DAILY 90 tablet 3   ezetimibe (ZETIA) 10 MG tablet Take 1 tablet (10 mg total) by mouth daily. 90 tablet 3   ferrous sulfate 325 (65 FE) MG tablet Take 325 mg by mouth daily with breakfast.     ibuprofen (ADVIL) 600 MG tablet Take 600 mg by mouth every 6 (six) hours as needed for moderate pain or headache.     metoprolol tartrate (LOPRESSOR) 25 MG tablet TAKE 1/2 TO 1 TABLET BY MOUTH  DAILY AS NEEDED FOR PALPITATIONS 100 tablet 2   Omega-3 Fatty Acids (FISH OIL) 1000 MG CAPS Take 2,000 mg by mouth daily.     omeprazole (PRILOSEC) 40 MG capsule TAKE ONE CAPSULE BY MOUTH EVERY DAY 30 capsule 6   No current facility-administered medications for this visit.     Past Medical History:  Diagnosis Date   Dyslipidemia    Endocarditis    GERD (gastroesophageal reflux disease)    Heart murmur    History of stress test 11/22/2010   Perfusion defect in the inferior myocardial region is consistent with diaphragmatic attenuation. The remaining myocardium demonstrates normal myocardial perfusion with no evidence of ischemia or infarct. The post stress left ventricle is normal in size. The post stress EF 65%, Gobal left ventricle systolic function is normal. No significant wall motion abnormalities noted. Normal Myocardial perfus   Hx of echocardiogram 02/10/2013   The  cavity size was normal. Ststolic function was normal. The estimated EF was in the range 55%-60%. Wall motion was normal; there was no regional wall motion abnormalities. Stable MV repair & annuloplasty ring. Trace MR   Hyperlipidemia    Hypertension    OSA on CPAP    SBE (subacute bacterial endocarditis)    Sleep apnea    Stroke ([facility])    3/10 endocarditis/embolic    ROS:   All systems reviewed and negative except as noted in the HPI.   Past Surgical History:  Procedure Laterality Date   A-FLUTTER ABLATION N/A 03/19/2022   Procedure: A-FLUTTER ABLATION;  Surgeon: Marinus Maw, MD;  Location: [facility] INVASIVE CV LAB;  Service: Cardiovascular;  Laterality: N/A;   CARDIAC CATHETERIZATION  01/2009   COLONOSCOPY     HERNIA REPAIR     right abd   INGUINAL HERNIA REPAIR     left side   MITRAL VALVE REPAIR  04/2009   17mm Edwards annuloplasty ring and quadrangular resection pf P2 segment of the motral valve by Dr Dorris Fetch with normal coronary arteries.   POLYPECTOMY     UPPER GASTROINTESTINAL ENDOSCOPY       Family History  Problem Relation Age of Onset   Heart disease Mother    Heart disease Father    Arthritis Father    Cirrhosis Brother    Hypertension  Maternal Aunt    Colon polyps Brother    Hypertension Brother    Hyperlipidemia Brother    Colon cancer Neg Hx    Esophageal cancer Neg Hx    Stomach cancer Neg Hx    Rectal cancer Neg Hx      Social History   Socioeconomic History   Marital status: Married    Spouse name: Not on file   Number of children: 2   Years of education: BS   Highest education level: Not on file  Occupational History   Occupation: Retired    Associate Professor: DISABLED   Occupation: [employer]  Tobacco Use   Smoking status: Former    Packs/day: 2.00    Years: 20.00    Total pack years: 40.00    Types: Cigarettes    Quit date: 04/29/1981    Years since quitting: 41.0   Smokeless tobacco: Never  Vaping Use   Vaping Use: Never used  Substance  and Sexual Activity   Alcohol use: Yes    Alcohol/week: 14.0 standard drinks of alcohol    Types: 14 Standard drinks or equivalent per week    Comment: 2 glasses wine every day   Drug use: No   Sexual activity: Not on file  Other Topics Concern   Not on file  Social History Narrative   Not on file   Social Determinants of Health   Financial Resource Strain: Not on file  Food Insecurity: Not on file  Transportation Needs: Not on file  Physical Activity: Not on file  Stress: Not on file  Social Connections: Not on file  Intimate Partner Violence: Not on file     BP 122/74   Pulse 67   Ht 6\' 3"  (1.905 m)   Wt 219 lb (99.3 kg)   SpO2 96%   BMI 27.37 kg/m   Physical Exam:  Well appearing NAD HEENT: Unremarkable Neck:  No JVD, no thyromegally Lymphatics:  No adenopathy Back:  No CVA tenderness Lungs:  Clear with no wheezes HEART:  Regular rate rhythm, no murmurs, no rubs, no clicks Abd:  soft, positive bowel sounds, no organomegally, no rebound, no guarding Ext:  2 plus pulses, no edema, no cyanosis, no clubbing Skin:  No rashes no nodules Neuro:  CN II through XII intact, motor grossly intact  EKG - NSR   Assess/Plan:  Atrial flutter - he is s/p EPS/RFA and is doing well. He will stop his OAC and start ASA 81 mg daily.  Dyslipidemia - continue his statin therapy. Sleep apnea - he will continue CPAP. HTN - he will continue his beta blocker. His bp is normal today. Wandalene Abrams,MD

## 2022-04-29 NOTE — Patient Instructions (Addendum)
Medication Instructions:  Your physician has recommended you make the following change in your medication:   STOP taking your Eliquis.  2.  Start taking Aspirin 81 mg.  Take one tablet by mouth daily.     Labwork: None ordered.  Testing/Procedures: None ordered.  Follow-Up: Your physician wants you to follow-up in: AS NEEDED with Lewayne Bunting, MD.   Any Other Special Instructions Will Be Listed Below (If Applicable).  If you need a refill on your cardiac medications before your next appointment, please call your pharmacy.   Important Information About Sugar

## 2022-05-10 ENCOUNTER — Other Ambulatory Visit: Payer: Self-pay | Admitting: Cardiovascular Disease

## 2022-07-16 ENCOUNTER — Other Ambulatory Visit: Payer: Self-pay | Admitting: Cardiovascular Disease

## 2022-10-22 ENCOUNTER — Ambulatory Visit
Admission: RE | Admit: 2022-10-22 | Discharge: 2022-10-22 | Disposition: A | Discharge status: Home / Self Care | Payer: Medicare Other | Source: Ambulatory Visit | Attending: Physician Assistant | Admitting: Physician Assistant

## 2022-10-22 ENCOUNTER — Other Ambulatory Visit: Payer: Self-pay | Admitting: Physician Assistant

## 2022-10-22 DIAGNOSIS — R051 Acute cough: Secondary | ICD-10-CM

## 2022-11-14 ENCOUNTER — Other Ambulatory Visit: Payer: Self-pay | Admitting: Cardiovascular Disease

## 2022-11-29 ENCOUNTER — Telehealth: Payer: Self-pay | Admitting: Internal Medicine

## 2022-11-29 NOTE — Telephone Encounter (Signed)
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  No follow up required.

## 2022-11-29 NOTE — Telephone Encounter (Signed)
Patient is requesting a letter from Dr. 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

## 2023-01-04 ENCOUNTER — Other Ambulatory Visit: Payer: Self-pay | Admitting: Cardiovascular Disease

## 2023-01-14 ENCOUNTER — Ambulatory Visit: Payer: Medicare Other | Admitting: Neurology

## 2023-01-14 ENCOUNTER — Encounter: Payer: Self-pay | Admitting: Neurology

## 2023-01-14 VITALS — BP 150/90 | HR 36 | Ht 75.0 in | Wt 226.5 lb

## 2023-01-14 DIAGNOSIS — I679 Cerebrovascular disease, unspecified: Secondary | ICD-10-CM | POA: Diagnosis not present

## 2023-01-14 NOTE — Patient Instructions (Signed)
Great to see you today  Continue to see your primary care doctor and cardiologist Keep BP < 130/90, LDL < 70, A1C < 7.0 See you back as needed

## 2023-01-14 NOTE — Progress Notes (Signed)
PATIENT: Edgar Jones DOB: 09/19/1946  REASON FOR VISIT: follow up for cerebrovascular disease  HISTORY FROM: patient PRIMARY NEUROLOGIST: Dr.Yan   HISTORY OF PRESENT ILLNESS: Today 01/14/23  Had a-flutter ablation back in May 2023, was successful. He is still on aspirin 81 mg daily. No longer seeing Dr. Lovena Le, is now going back to seeing Dr. Claiborne Billings. Wearing CPAP. BP is up today, takes Benazepril. Usually at home runs's 130-140's/70. LDL March 2023 63. He denies any palpations. He has metoprolol as needed, has never taken. 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 Denies any neurological issues. No headaches, vision change, numbness or weakness to arms or legs. Had chest x-ray that showed emphysema, he smoked until he was 72. He feels good today.  Update 01/09/22 SS: Mr. Vogelsang here today for follow-up. 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 Questionable TIA back in 2015. Sees cardiology,Dr. Claiborne Billings last week.  EKG showed atrial flutter, he was placed on Eliquis 5 mg twice a day. Will be seen by Dr. Lovena Le for consideration of atrial flutter candidacy. Does note less energy recently. Denies palpations or dizziness. At times his HR drops into 30's, claims can't feel any different. Still driving the dump truck. CDL evaluation is coming up mid march. Denies any recent TIA events or weakness. Describes back in 2015, 2 events, 1: right arm went limp, 2: lost eye sight both eyes for few moments. Sees PCP regularly. Denies any problems with vision, balance, or gait. 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   Update 03/08/2021 SS: Mr. Perl is a 77 year old male with history of cerebrovascular disease.  In 2015, he had 2 TIA type events, has been on blood pressure medication, aspirin, and statin drug since that time.  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  Follows with cardiology, wore a heart  monitor, showed 3 episodes of Nonsustained VTAC, 22 episodes of SVT, reported given metoprolol as needed for palpitations, he has follow-up in May. 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 He drives a dump truck, works for [employer], full time hours as long as good weather. Using CPAP. BP slightly up today, just took medications before he got here.  Here today for evaluation unaccompanied.  He denies any changes to the vision, numbness or weakness to the arms or legs, or changes to the balance.  Update 03/08/20 SS: Mr. Knaff is a 77 year old male with a history of cerebrovascular disease.  In 2015, he had 2 TIA type events, he has been on blood pressure medication, aspirin, and statin drug since that time.  He works as a Administrator, he requires Heritage manager for [place].  He has not had any further TIA events.  He denies any changes to the vision, numbness or weakness to the arms or legs, changes to the balance, chest pain, palpitations.  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  He feels he is overall doing well.  He says he may have some shortness of breath, at the end of a long workday, otherwise no problems.  He presents today for annual evaluation.  HISTORY 03/08/2019 Dr. Jannifer Franklin: Edgar Jones is a 77 year old right-handed white male with a history of cerebrovascular disease.  In 2015 he had two TIA type events, he has been on blood pressure medications, aspirin, and a statin drug  since that time, he has not had any recurrence.  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  The patient reports no new symptoms over the last year.  He denies any headache, vision changes, or numbness or weakness of the face, arms, legs.  He denies any speech or swallowing problems and he denies any balance issues.  He has not reported any new medical issues that have come up since last seen.  He continues to work as a Administrator.  The patient does report  occasional episodes of shortness of breath, he denies any palpitations of the heart or severe fatigue with these events.  REVIEW OF SYSTEMS: Out of a complete 14 system review of symptoms, the patient complains only of the following symptoms, and all other reviewed systems are negative.  See HPI  ALLERGIES: No Known Allergies  HOME MEDICATIONS: Outpatient Medications Prior to Visit  Medication Sig Dispense Refill   amoxicillin (AMOXIL) 500 MG capsule Take 2,000 mg by mouth See admin instructions. Take before dentist     aspirin EC 81 MG tablet Take 1 tablet (81 mg total) by mouth daily. Swallow whole. 90 tablet 3   atorvastatin (LIPITOR) 40 MG tablet TAKE 1 TABLET BY MOUTH  DAILY 100 tablet 2   benazepril (LOTENSIN) 40 MG tablet Take 1 tablet (40 mg total) by mouth daily. 100 tablet 2   ezetimibe (ZETIA) 10 MG tablet TAKE 1 TABLET BY MOUTH DAILY 100 tablet 2   ferrous sulfate 325 (65 FE) MG tablet Take 325 mg by mouth daily with breakfast.     ibuprofen (ADVIL) 600 MG tablet Take 600 mg by mouth every 6 (six) hours as needed for moderate pain or headache.     Omega-3 Fatty Acids (FISH OIL) 1000 MG CAPS Take 2,000 mg by mouth daily.     omeprazole (PRILOSEC) 40 MG capsule TAKE ONE CAPSULE BY MOUTH EVERY DAY 30 capsule 6   metoprolol tartrate (LOPRESSOR) 25 MG tablet TAKE 1/2 TO 1 TABLET BY MOUTH  DAILY AS NEEDED FOR PALPITATIONS (Patient not taking: Reported on 01/14/2023) 60 tablet 3   No facility-administered medications prior to visit.    PAST MEDICAL HISTORY: Past Medical History:  Diagnosis Date   Dyslipidemia    Endocarditis    GERD (gastroesophageal reflux disease)    Heart murmur    History of stress test 11/22/2010   Perfusion defect in the inferior myocardial region is consistent with diaphragmatic attenuation. The remaining myocardium demonstrates normal myocardial perfusion with no evidence of ischemia or infarct. The post stress left ventricle is normal in size. The post  stress EF 65%, Gobal left ventricle systolic function is normal. No significant wall motion abnormalities noted. Normal Myocardial perfus   Hx of echocardiogram 02/10/2013   The cavity size was normal. Ststolic function was normal. The estimated EF was in the range 55%-60%. Wall motion was normal; there was no regional wall motion abnormalities. Stable MV repair & annuloplasty ring. Trace MR   Hyperlipidemia    Hypertension    OSA on CPAP    SBE (subacute bacterial endocarditis)    Sleep apnea    Stroke ([place])    3/10 endocarditis/embolic    PAST SURGICAL HISTORY: Past Surgical History:  Procedure Laterality Date   A-FLUTTER ABLATION N/A 03/19/2022   Procedure: A-FLUTTER ABLATION;  Surgeon: Evans Lance, MD;  Location: [place] CV LAB;  Service: Cardiovascular;  Laterality: N/A;   CARDIAC CATHETERIZATION  01/2009  COLONOSCOPY     HERNIA REPAIR     right abd   INGUINAL HERNIA REPAIR     left side   MITRAL VALVE REPAIR  04/2009   31m Edwards annuloplasty ring and quadrangular resection pf P2 segment of the motral valve by Dr HRoxan Hockeywith normal coronary arteries.   POLYPECTOMY     UPPER GASTROINTESTINAL ENDOSCOPY      FAMILY HISTORY: Family History  Problem Relation Age of Onset   Heart disease Mother    Heart disease Father    Arthritis Father    Cirrhosis Brother    Hypertension Maternal Aunt    Colon polyps Brother    Hypertension Brother    Hyperlipidemia Brother    Colon cancer Neg Hx    Esophageal cancer Neg Hx    Stomach cancer Neg Hx    Rectal cancer Neg Hx     SOCIAL HISTORY: Social History   Socioeconomic History   Marital status: Married    Spouse name: Not on file   Number of children: 2   Years of education: BS   Highest education level: Not on file  Occupational History   Occupation: Retired    EFish farm manager DISABLED   Occupation: [employer]  Tobacco Use   Smoking status: Former    Packs/day: 2.00    Years: 20.00    Total pack years:  40.00    Types: Cigarettes    Quit date: 04/29/1981    Years since quitting: 41.7   Smokeless tobacco: Never  Vaping Use   Vaping Use: Never used  Substance and Sexual Activity   Alcohol use: Yes    Alcohol/week: 14.0 standard drinks of alcohol    Types: 14 Standard drinks or equivalent per week    Comment: 2 glasses wine every day   Drug use: No   Sexual activity: Not on file  Other Topics Concern   Not on file  Social History Narrative   Not on file   Social Determinants of Health   Financial Resource Strain: Not on file  Food Insecurity: Not on file  Transportation Needs: Not on file  Physical Activity: Not on file  Stress: Not on file  Social Connections: Not on file  Intimate Partner Violence: Not on file   PHYSICAL EXAM  Vitals:   01/14/23 0743 01/14/23 0748 01/14/23 0806  BP: (!) 147/114 (!) 143/69 (!) 150/90  Pulse: 71 (!) 36   Weight: 226 lb 8 oz (102.7 kg)    Height: '6\' 3"'$  (1.905 m)     Body mass index is 28.31 kg/m.  Generalized: Well developed, in no acute distress  Neurological examination  Mentation: Alert oriented to time, place, history taking. Follows all commands speech and language fluent Cranial nerve II-XII: Pupils were equal round reactive to light. Extraocular movements were full, visual field were full on confrontational test. Facial sensation and strength were normal. Head turning and shoulder shrug  were normal and symmetric. Motor: The motor testing reveals 5 over 5 strength of all 4 extremities. Good symmetric motor tone is noted throughout.  Sensory: Sensory testing is intact to soft touch on all 4 extremities. No evidence of extinction is noted.  Coordination: Cerebellar testing reveals good finger-nose-finger and heel-to-shin bilaterally.  Gait and station: Gait is normal.  Reflexes: Deep tendon reflexes are symmetric and normal bilaterally.   DIAGNOSTIC DATA (LABS, IMAGING, TESTING) - I reviewed patient records, labs, notes, testing  and imaging myself where available.  Lab Results  Component Value  Date   WBC 6.8 02/18/2022   HGB 14.5 02/18/2022   HCT 43.9 02/18/2022   MCV 92 02/18/2022   PLT 109 (L) 02/18/2022      Component Value Date/Time   NA 144 02/18/2022 1532   K 4.6 02/18/2022 1532   CL 108 (H) 02/18/2022 1532   CO2 22 02/18/2022 1532   GLUCOSE 87 02/18/2022 1532   GLUCOSE 90 03/28/2017 0925   BUN 12 02/18/2022 1532   CREATININE 1.08 02/18/2022 1532   CREATININE 1.10 03/28/2017 0925   CALCIUM 9.1 02/18/2022 1532   PROT 6.9 01/11/2022 1057   ALBUMIN 4.6 01/11/2022 1057   AST 17 01/11/2022 1057   ALT 17 01/11/2022 1057   ALKPHOS 50 01/11/2022 1057   BILITOT 0.9 01/11/2022 1057   GFRNONAA 63 11/02/2020 1110   GFRAA 73 11/02/2020 1110   Lab Results  Component Value Date   CHOL 120 01/11/2022   HDL 39 (L) 01/11/2022   LDLCALC 63 01/11/2022   TRIG 91 01/11/2022   CHOLHDL 3.1 01/11/2022   Lab Results  Component Value Date   HGBA1C  05/08/2009    4.6 (NOTE) The ADA recommends the following therapeutic goal for glycemic control related to Hgb A1c measurement: Goal of therapy: <6.5 Hgb A1c  Reference: American Diabetes Association: Clinical Practice Recommendations 2010, Diabetes Care, 2010, 33: (Suppl  1).   No results found for: "VITAMINB12" Lab Results  Component Value Date   TSH 1.080 01/11/2022   ASSESSMENT AND PLAN 77 y.o. year old male  has a past medical history of Dyslipidemia, Endocarditis, GERD (gastroesophageal reflux disease), Heart murmur, History of stress test (11/22/2010), echocardiogram (02/10/2013), Hyperlipidemia, Hypertension, OSA on CPAP, SBE (subacute bacterial endocarditis), Sleep apnea, and Stroke ([place]). here with:  1.  Cerebrovascular disease (history of stroke 2010 cerebral infarcts, TIA x 2 in 2015) 2.  History of atrial flutter, post atrial ablation May 2023, sees Dr. Kelly/Dr. Lovena Le  -Overall stable from a neurological standpoint -Continue close follow-up with  cardiology and PCP -He denies any palpitations or cardiac issues, BP is slightly elevated today, HR range was documented 36-71 today, in the 60's on my exam, needs to continue to monitor -Continue management of vascular risk factors for stroke prevention BP less than 130/90, LDL less than 70, A1c less than 7.0 -Follow-up in our office on an as-needed basis  Evangeline Dakin, DNP 01/14/2023, 8:06 AM [facility] [address] [place], [place] [address] [phone]

## 2023-02-12 NOTE — Progress Notes (Unsigned)
  Electrophysiology Office Note:   Date:  02/13/2023  ID:  RAYWOOD AXON, DOB 1946/04/25, MRN [id number]  Primary Cardiologist: Shelva Majestic, MD Electrophysiologist: Dr. Lovena Le  History of Present Illness:   Edgar Jones is a 77 y.o. male with h/o AFL s/p ablation 03/2022, HLD, OSA, CVA, h/o MV repair, and HTN seen today for routine electrophysiology followup. Since last being seen in our clinic the patient reports doing well overall. HRs at home on pulse ox range upper 30-50s. He denies any symptoms of SOB, CP, lightheadedness, dizziness, or syncope. He also denies palpitations.   Review of systems complete and found to be negative unless listed in HPI.   Studies Reviewed:    EKG is ordered today. Personal review shows NSR at 68 bpm with frequent PACs vs sinus arrhythmia  Risk Assessment/Calculations:         Physical Exam:   VS:  BP 126/84   Pulse 68   Ht 6\' 3"  (1.905 m)   Wt 228 lb (103.4 kg)   SpO2 97%   BMI 28.50 kg/m    Wt Readings from Last 3 Encounters:  02/13/23 228 lb (103.4 kg)  01/14/23 226 lb 8 oz (102.7 kg)  04/29/22 219 lb (99.3 kg)     GEN: Well nourished, well developed in no acute distress NECK: No JVD; No carotid bruits CARDIAC: Regular rate and rhythm, no murmurs, rubs, gallops RESPIRATORY:  Clear to auscultation without rales, wheezing or rhonchi  ABDOMEN: Soft, non-tender, non-distended EXTREMITIES:  No edema; No deformity   ASSESSMENT AND PLAN:    H/o AFL S/p EPS/RFA 03/2022 EKG today shows NSR with frequent PACs vs sinus arrhyhtmia.   ?Bradycardia Bradysphygmia I suspect his low HR reads are spurious based on todays EKG and due to ectopy.  He is not currently having any symptoms of bradycardia.  Offered Zio XT for completeness, pt wishes to defer until he has any symptoms which I think is reasonable.   HLD Continue statin   HTN Stable on current regimen   OSA  Encouraged nightly CPAP   Follow up with Dr. Lovena Le in 12 months. Consider  Zio if pt has worsening SOB, fatigue, or more frequent reports of bradycardia.   Signed, Shirley Friar, PA-C

## 2023-02-13 ENCOUNTER — Ambulatory Visit: Payer: Medicare Other | Admitting: Student

## 2023-02-13 ENCOUNTER — Ambulatory Visit: Payer: Medicare Other | Attending: Student | Admitting: Student

## 2023-02-13 ENCOUNTER — Encounter: Payer: Self-pay | Admitting: Student

## 2023-02-13 VITALS — BP 126/84 | HR 68 | Ht 75.0 in | Wt 228.0 lb

## 2023-02-13 DIAGNOSIS — G4733 Obstructive sleep apnea (adult) (pediatric): Secondary | ICD-10-CM | POA: Diagnosis not present

## 2023-02-13 DIAGNOSIS — I4892 Unspecified atrial flutter: Secondary | ICD-10-CM | POA: Diagnosis not present

## 2023-02-13 DIAGNOSIS — I10 Essential (primary) hypertension: Secondary | ICD-10-CM

## 2023-02-13 NOTE — Patient Instructions (Signed)
Medication Instructions:  Your physician recommends that you continue on your current medications as directed. Please refer to the Current Medication list given to you today.  *If you need a refill on your cardiac medications before your next appointment, please call your pharmacy*   Lab Work: None If you have labs (blood work) drawn today and your tests are completely normal, you will receive your results only by: MyChart Message (if you have MyChart) OR A paper copy in the mail If you have any lab test that is abnormal or we need to change your treatment, we will call you to review the results.   Follow-Up: At [facility], you and your health needs are our priority.  As part of our continuing mission to provide you with exceptional heart care, we have created designated Provider Care Teams.  These Care Teams include your primary Cardiologist (physician) and Advanced Practice Providers (APPs -  Physician Assistants and Nurse Practitioners) who all work together to provide you with the care you need, when you need it.  Your next appointment:   1 year(s)  Provider:   Gregg Taylor, MD  

## 2023-02-16 ENCOUNTER — Other Ambulatory Visit: Payer: Self-pay | Admitting: Internal Medicine

## 2023-03-17 ENCOUNTER — Encounter: Payer: Self-pay | Admitting: Cardiovascular Disease

## 2023-03-17 ENCOUNTER — Ambulatory Visit: Payer: Medicare Other | Attending: Cardiovascular Disease | Admitting: Cardiovascular Disease

## 2023-03-17 VITALS — BP 136/86 | HR 62 | Ht 75.0 in | Wt 227.0 lb

## 2023-03-17 DIAGNOSIS — G4733 Obstructive sleep apnea (adult) (pediatric): Secondary | ICD-10-CM

## 2023-03-17 DIAGNOSIS — I10 Essential (primary) hypertension: Secondary | ICD-10-CM

## 2023-03-17 DIAGNOSIS — I4892 Unspecified atrial flutter: Secondary | ICD-10-CM | POA: Diagnosis not present

## 2023-03-17 DIAGNOSIS — I491 Atrial premature depolarization: Secondary | ICD-10-CM

## 2023-03-17 DIAGNOSIS — Z9889 Other specified postprocedural states: Secondary | ICD-10-CM

## 2023-03-17 DIAGNOSIS — I498 Other specified cardiac arrhythmias: Secondary | ICD-10-CM

## 2023-03-17 NOTE — Patient Instructions (Signed)
Medication Instructions:  The current medical regimen is effective;  continue present plan and medications.  *If you need a refill on your cardiac medications before your next appointment, please call your pharmacy*   Lab Work: Fasting lab work (CBC, CMET, TSH, LIPID)  If you have labs (blood work) drawn today and your tests are completely normal, you will receive your results only by: MyChart Message (if you have MyChart) OR A paper copy in the mail If you have any lab test that is abnormal or we need to change your treatment, we will call you to review the results.   Testing/Procedures: Echocardiogram - Your physician has requested that you have an echocardiogram. Echocardiography is a painless test that uses sound waves to create images of your heart. It provides your doctor with information about the size and shape of your heart and how well your heart's chambers and valves are working. This procedure takes approximately one hour. There are no restrictions for this procedure.     Follow-Up: At [facility], you and your health needs are our priority.  As part of our continuing mission to provide you with exceptional heart care, we have created designated Provider Care Teams.  These Care Teams include your primary Cardiologist (physician) and Advanced Practice Providers (APPs -  Physician Assistants and Nurse Practitioners) who all work together to provide you with the care you need, when you need it.  We recommend signing up for the patient portal called "MyChart".  Sign up information is provided on this After Visit Summary.  MyChart is used to connect with patients for Virtual Visits (Telemedicine).  Patients are able to view lab/test results, encounter notes, upcoming appointments, etc.  Non-urgent messages can be sent to your provider as well.   To learn more about what you can do with MyChart, go to [email or web address].    Your next appointment:   6  month(s)  Provider:   Nicki Guadalajara, MD

## 2023-03-17 NOTE — Progress Notes (Signed)
Patient ID: Edgar Jones, male   DOB: [birth year], 77 y.o.   MRN: [id number]       Primary M.D.: Dr. Marjory Lies  HPI: [name] is a 77 year old former a patient of Dr. Alanda Amass. He presents for a 15 month follow-up evaluation.   [name] underwent mitral valve repair or in June 2010 with a 32 mm Edwards annuplasty plasty ring and quadrangular resection of P2 segment of the mitral valve which was done by Dr. Dorris Fetch. 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 At that time he had normal coronary arteries. He has a history of obstructive sleep apnea since 2010 and has been on CPAP therapy and admits to 100% use. There is also a history of hypertension as well as hyperlipidemia in addition to GERD.  When I saw initially saw him in January 2015, he denied any episodes of chest pain. He noted mild episodes intermittently and feels at times she is unable to get a deep breath. An echo Doppler study in April 2014 confirmed normal systolic function with an ejection fraction of 55-60%. Left atrium was mildly dilated. His mitral valve repair and annuloplasty ring were stable there was no evidence for prolapse and only trivial regurgitation. He had mild TR.  Prior to initially seeing him in September 2015 he had experienced 2 transient events associated with transient right hand twitching and weakness as well as another episode of "total "vision.  He he denied any dizziness or he saw Dr. Lesia Jones.  Carotid studies did not demonstrate any significant abnormality.  An MRI of his brain did not reveal any acute intracranial abnormality.  There was expected evolution of the left occipital lobe hemorrhage and small right parietal cortex infarct, which previously had been detected in 2010.  There was evidence for mild interval increase microhemorrhages in both cerebral hemispheres.  A repeat echo Doppler study was also done in June 2015,  which showed an ejection fraction in the range of 60-65%.  There was probable to diastolic dysfunction.  There is trivial aortic regurgitation.  His mitral valve repair was intact with moderately thickened leaflets.  His atrium was dilated.  His right ventricle was mildly dilated.  It was felt by Dr. Anne Hahn that he possibly he may have had a TIA, but this was not definitive.  The possibility of a future loop recorder was discussed if the patient had recurrent symptomatology.  The patient denies any recurrent symptoms.  He denies any awareness of any palpitations.  He denies chest pain.  He denies fever or shortness of breath.  He has a history of hypertension and has hyperlipidemia on simvastatin 40 mg.  There is a history of GERD for which she is on Prilosec.  He also has obstructive sleep apnea and continues to use CPAP with 100% compliance.   When seen in September 2016 he continued to be stable and denied any chest pain and had experienced mild occasional shortness of breath, not typically exertionally precipitated.  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  He has a history of GERD but this has been controlled with omeprazole.  He has been taking simvastatin 40 mg daily for hyperlipidemia. He has been on been  omeprazole 20 mg daily for high blood pressure.  Several years ago his triglycerides were elevated and omega-3 fatty acid was added to his lipid lowering  therapy.    On 01/17/2016 he underwent a follow-up echo Doppler study. This showed an EF of 60-65% with mild LVH. There was very mild aortic stenosis with a mean gradient of 11 mm. Aortic root measured 38 mm. He had severe left atrial dilatation and moderate right atrial dilatation..  Pulmonary pressures were normal with a PA pressure at 27 mm.  His mitral valve repair was stable without significant stenosis.   He continues to drive a Edgar Jones and needs certified driving license. Laboratory by his  primary in January 2018: total cholesterol 202, triglycerides 225, HDL 39, and LDL 136 with a non-HDL of 161.  When I last saw him, I discontinued simvastatin, and in its place started rosuvastatin 20 mg.  He underwent a one-year follow-up echo Doppler study on 02/13/2017.  He has normal systolic function without regional wall motion abnormalities.  There was trivial aortic insufficiency.  His mitral annular ring prosthesis was present and function normally.  There was no MR.  He had severe LA dilatation and mild right a dilatation.  Subsequent  laboratory in May 2018 showed improvement in lipids, such that total cholesterol is now 149, triglycerides 130, HDL 42, and LDL 81.  Dates.  He still has been having red meat 2-3 times per week, which is usually a clean cut.  He also has fish and chicken.  LFTs were normal.    I saw him in May 2019 and over the prior year he had continued to be stable.  He specifically denied any  chest pain or shortness of breath.  He denied any significant palpitation.  He continued to use CPAP with 100% compliance and is followed at [facility]  He continues to drive a truck.  He's asymptomatic with reference to dizziness, presyncope or syncope.  He had undergone a follow-up neurologic evaluation in April 2019 and was stable.  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  He was evaluated by Edd Fabian, NP in May and in June 2021.  He had been evaluated by his primary care physician in May 2021 and his heart rate was bradycardic in the upper 40s.  His ECG showed sinus rhythm with first-degree AV block with PACs.  He wore a Zio patch monitor which showed predominant rhythm at 67 bpm with only had some low heart rates with the lowest heart rate at 39 bpm which occurred while sleeping at 3:53 AM.  There also were  occasional very short bursts of SVT.  He saw Edd Fabian in follow-up.  His last echo Doppler study in December 2020 showed an EF of 55 to 60%.  There was mild aortic valve stenosis with a mean gradient of 12 and valve area of 1.9 cm.  I saw him in February 2022.  Time he denied any chest pain, presyncope, or syncope.  He was experiencing occasional palpitations and admitted to decreased energy level.  He continued to use CPAP and received a new machine approximately 3 years ago.  He had been recently started on Zetia to take in addition to his atorvastatin 40 mg with an LDL cholesterol of 117 and also has been taking over-the-counter fish oil.  He has been on benazepril 40 mg daily for hypertension and takes omeprazole for GERD.  I recommended that he have a follow-up echo Doppler study to  reassess systolic and diastolic function as well as aortic valve disease.  His ECG showed sinus rhythm with marked sinus arrhythmia, PVCs, first-degree AV block and a ventricular rate in the 40s.  I suggested he wear a 2-week event monitor.  I saw him on Mar 27, 2021.  He wore a Zio patch monitor from February 10 through January 04, 2021.  The predominant rhythm was sinus rhythm with average rate at 63.  His slowest heart rate was sinus bradycardia at 30 bpm which occurred while sleeping at 1:58 AM on December 30, 2020.  The maximum rate was 154 bpm.  He had 3 episodes of nonsustained VT with the fastest interval of 4 beats at a maximum rate of 154 and the longest lasted 4 beats with an average rate of 123.  He had 22 brief episodes of SVT with the fastest interval lasting 7 beats with a maximum rate of 144 and the longest interval lasting 16 beats with an average rate of 103.  He had frequent PACs, occasional atrial couplets, isolated PVCs with rare couplets and triplets.  He also had ventricular bigeminy.  With his history of episodes I did not start him on daily metoprolol but recommended heat take metoprolol tartrate  12.5 mg to 25 mg on an as-needed basis for palpitations or bursts of SVT.  He denies any chest pain.  His echo Doppler study on January 11, 2021 showed low normal EF at 50 to 55% without wall motion abnormalities.  There was mild LVH.  There was a mitral annuloplasty ring and mild residual mitral regurgitation.  There was mild to moderate aortic sclerosis without stenosis.  He continues to use CPAP.  I obtained a new download from March 14 through February 20, 2021.  Compliance is 100% with average use 5 hours and 46 minutes.  His pressure setting is a range of 5 to 15 cm and his 95th percentile pressure is 11.7 with maximum average pressure 13.1.  AHI is 3.2.  His CPAP set up date was 2015.  As result he has a 3G CPAP unit and since mid April 2022 this was no longer is downloadable wirelessly due to the change to 5G.    I recommended that he have an EP evaluation and on June 22nd, 2022 he saw Dr. 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  His cardiac monitor showed mostly nighttime bradycardia as well as frequent PACs and some PVCs but very brief nonsustained atrial tachycardia.  At that time, his symptoms and findings on his event monitor were reassuring and it was felt that his pacemaker was not necessary and if he did develop worsening symptoms he may be a candidate for a loop recorder.  I last saw him on January 01, 2022.  Since his prior evaluation, he continues to drive a dump truck.  He denies any dizziness or lightheadedness.  He is unaware of any significant palpitations.  We were able to obtain a download from his DME company which continue to show excellent compliance from November 1 through December 09, 2021 with average CPAP use of 6 hours and 19 minutes.  AHI is 3.8 and his pressure range is set at 5 to 15 cm of water.  His 95th percentile pressure was 12.0.  His CPAP is [facility] at [facility].  During that evaluation, he was in atrial flutter and I recommended he see Dr. Ladona Ridgel for  further evaluation and potential additional treatment for consideration of atrial flutter candidacy.  He  subsequently underwent atrial flutter catheter ablation by Dr. Ladona Ridgel on Mar 19, 2022 and saw him in follow-up on July 30, 2022.  He recently was seen by Maxine Glenn for EP evaluation on February 13, 2023.  ECG showed sinus rhythm with frequent PACs versus sinus arrhythmia.  Presently, Mr. Marshall Cork feels well.  He is still working.  He denies chest pain or shortness of breath.  He continues to use CPAP followed by [facility].  He is on benazepril 40 mg, and metoprolol tartrate on an as-needed basis.  He is on Zetia 10 mg and atorvastatin 40 mg for hyperlipidemia.  Takes baby aspirin.  He takes omeprazole for GERD.  He presents for evaluation.    Past Medical History:  Diagnosis Date   Dyslipidemia    Endocarditis    GERD (gastroesophageal reflux disease)    Heart murmur    History of stress test 11/22/2010   Perfusion defect in the inferior myocardial region is consistent with diaphragmatic attenuation. The remaining myocardium demonstrates normal myocardial perfusion with no evidence of ischemia or infarct. The post stress left ventricle is normal in size. The post stress EF 65%, Gobal left ventricle systolic function is normal. No significant wall motion abnormalities noted. Normal Myocardial perfus   Hx of echocardiogram 02/10/2013   The cavity size was normal. Ststolic function was normal. The estimated EF was in the range 55%-60%. Wall motion was normal; there was no regional wall motion abnormalities. Stable MV repair & annuloplasty ring. Trace MR   Hyperlipidemia    Hypertension    OSA on CPAP    SBE (subacute bacterial endocarditis)    Sleep apnea    Stroke [facility])    3/10 endocarditis/embolic    Past Surgical History:  Procedure Laterality Date   A-FLUTTER ABLATION N/A 03/19/2022   Procedure: A-FLUTTER ABLATION;  Surgeon: Marinus Maw, MD;  Location: [facility] INVASIVE CV LAB;   Service: Cardiovascular;  Laterality: N/A;   CARDIAC CATHETERIZATION  01/2009   COLONOSCOPY     HERNIA REPAIR     right abd   INGUINAL HERNIA REPAIR     left side   MITRAL VALVE REPAIR  04/2009   32mm Edwards annuloplasty ring and quadrangular resection pf P2 segment of the motral valve by Dr Dorris Fetch with normal coronary arteries.   POLYPECTOMY     UPPER GASTROINTESTINAL ENDOSCOPY      No Known Allergies  Current Outpatient Medications  Medication Sig Dispense Refill   amoxicillin (AMOXIL) 500 MG capsule Take 2,000 mg by mouth See admin instructions. Take before dentist     aspirin EC 81 MG tablet Take 1 tablet (81 mg total) by mouth daily. Swallow whole. 90 tablet 3   atorvastatin (LIPITOR) 40 MG tablet TAKE 1 TABLET BY MOUTH DAILY 90 tablet 0   benazepril (LOTENSIN) 40 MG tablet Take 1 tablet (40 mg total) by mouth daily. 100 tablet 2   ezetimibe (ZETIA) 10 MG tablet TAKE 1 TABLET BY MOUTH DAILY 100 tablet 2   ferrous sulfate 325 (65 FE) MG tablet Take 325 mg by mouth daily with breakfast.     ibuprofen (ADVIL) 600 MG tablet Take 600 mg by mouth every 6 (six) hours as needed for moderate pain or headache.     metoprolol tartrate (LOPRESSOR) 25 MG tablet TAKE 1/2 TO 1 TABLET BY MOUTH  DAILY AS NEEDED FOR PALPITATIONS 60 tablet 3   Omega-3 Fatty Acids (FISH OIL) 1000 MG CAPS Take 2,000 mg by mouth daily.  omeprazole (PRILOSEC) 40 MG capsule TAKE ONE CAPSULE BY MOUTH EVERY DAY 30 capsule 6   No current facility-administered medications for this visit.    Socially he is married has 3 children and 2 grandchildren. There is a remote tobacco history but he quit over 30 years ago. 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 He is able to exercise without restriction. He does play golf and rides a bike.  Family History  Problem Relation Age of Onset   Heart disease Mother    Heart disease Father    Arthritis Father     Cirrhosis Brother    Hypertension Maternal Aunt    Colon polyps Brother    Hypertension Brother    Hyperlipidemia Brother    Colon cancer Neg Hx    Esophageal cancer Neg Hx    Stomach cancer Neg Hx    Rectal cancer Neg Hx    He tells me that his father died with an aortic aneurysm.  His sister had pancreatic cancer.  ROS General: Negative; No fevers, chills, or night sweats;  HEENT: Negative; No changes in vision or hearing, sinus congestion, difficulty swallowing Pulmonary: Negative; No cough, wheezing, shortness of breath, hemoptysis Cardiovascular: see HPI GI: Negative; No nausea, vomiting, diarrhea, or abdominal pain GU: Negative; No dysuria, hematuria, or difficulty voiding Musculoskeletal: Negative; no myalgias, joint pain, or weakness Hematologic/Oncology: Negative; no easy bruising, bleeding Endocrine: Negative; no heat/cold intolerance; no diabetes Neuro: See history of present illness; no changes in balance, headaches Skin: Negative; No rashes or skin lesions Psychiatric: Negative; No behavioral problems, depression Sleep: Positive for sleep apnea on CPAP therapy;  He admits to 100% compliance; No snoring, daytime sleepiness, hypersomnolence, bruxism, restless legs, hypnogognic hallucinations, no cataplexy Other comprehensive 14 point system review is negative.   PE BP 136/86   Pulse 62   Ht 6\' 3"  (1.905 m)   Wt 227 lb (103 kg)   SpO2 94%   BMI 28.37 kg/m    Repeat blood pressure by me was 128/78  Wt Readings from Last 3 Encounters:  03/17/23 227 lb (103 kg)  02/13/23 228 lb (103.4 kg)  01/14/23 226 lb 8 oz (102.7 kg)   General: Alert, oriented, no distress.  Skin: normal turgor, no rashes, warm and dry HEENT: Normocephalic, atraumatic. Pupils equal round and reactive to light; sclera anicteric; extraocular muscles intact;  Nose without nasal septal hypertrophy Mouth/Parynx benign; Mallinpatti scale 3 Neck: No JVD, no carotid bruits; normal carotid  upstroke Lungs: clear to ausculatation and percussion; no wheezing or rales Chest wall: without tenderness to palpitation Heart: PMI not displaced, irregular rhythm, s1 s2 normal, 1/6 systolic murmur, no diastolic murmur, no rubs, gallops, thrills, or heaves Abdomen: soft, nontender; no hepatosplenomehaly, BS+; abdominal aorta nontender and not dilated by palpation. Back: no CVA tenderness Pulses 2+ Musculoskeletal: full range of motion, normal strength, no joint deformities Extremities: no clubbing cyanosis or edema, Homan's sign negative  Neurologic: grossly nonfocal; Cranial nerves grossly wnl Psychologic: Normal mood and affect  Mar 17, 2023 ECG (independently read by me): Sinus rhythm at 62 with sinus arrythmia, PACs, 1st degree AV block  January 01, 2022 ECG (independently read by me):  Atrial flutter with variable block at 51  Mar 27, 2021 ECG (independently read by me): Sinus bradycarda st 48, marked sinus arrythmia with 1.9 second pause;  1st degree AV block, PR 214  February 2022 ECG (independently read by me): Sinus  rhythm with marked sinus arrhythmia, PACs, ventricular rate averaging 46 bpm. First-degree AV block with a PR interval at 220 ms. Inferolateral T wave abnormality.  May 2017 ECG (independently read by me): Sinus bradycardia at 48 bpm with mild sinus arrhythmia and first-degree AV block.  Isolated PAC.  PR interval 232 ms.  QTc interval 394 ms.  May 2018 ECG (independently read by me): Normal sinus rhythm at 60 bpm.  First degree AV block.  PAC.  PR interval 214 ms per QTc interval 394 ms.  March 2018 ECG (independently read by me): Normal sinus rhythm at 63 bpm with sinus arrhythmia, first-degree AV block, nonspecific T changes.  March 2017 ECG (independently read by me):  Sinus bradycardia 50 bpm with first-degree block.  No ectopy. Mild T wave abnormality inferiorly.  September 2016 ECG (independently read by me): Sinus bradycardia 53 bpm.  First-degree AV block  with a PR interval at 224 ms.  ECG (independently read by me): Normal sinus rhythm at 63 bpm.  Occasional PVC.  First-degree AV block with a PR interval at 212.  September 2015 ECG (independently read by me): Sinus bradycardia 51 beats per minute.  Borderline first-degree V. block with a PR interval of 208 ms.  QTc interval 411 milliseconds  November 23, 2013 ECG (independently read by me): Sinus bradycardia with very mild first-degree block with a PR interval of 218 ms. No significant ST changes.  LABS:     Latest Ref Rng & Units 02/18/2022    3:32 PM 01/11/2022   10:57 AM 03/22/2021   10:57 AM  BMP  Glucose 70 - 99 mg/dL 87  81  84   BUN 8 - 27 mg/dL 12  16  17    Creatinine 0.76 - 1.27 mg/dL 1.61  0.96  0.45   BUN/Creat Ratio 10 - 24 11  14  15    Sodium 134 - 144 mmol/L 144  139  138   Potassium 3.5 - 5.2 mmol/L 4.6  4.9  4.2   Chloride 96 - 106 mmol/L 108  103  104   CO2 20 - 29 mmol/L 22  24  21    Calcium 8.6 - 10.2 mg/dL 9.1  9.3  8.5       Latest Ref Rng & Units 01/11/2022   10:57 AM 03/22/2021   10:57 AM 11/02/2020   11:10 AM  Hepatic Function  Total Protein 6.0 - 8.5 g/dL 6.9  6.4  7.2   Albumin 3.7 - 4.7 g/dL 4.6  4.3  4.6   AST 0 - 40 IU/L 17  25  20    ALT 0 - 44 IU/L 17  24  23    Alk Phosphatase 44 - 121 IU/L 50  47  52   Total Bilirubin 0.0 - 1.2 mg/dL 0.9  0.4  0.8       Latest Ref Rng & Units 02/18/2022    3:32 PM 01/18/2022   11:11 AM 01/11/2022   10:57 AM  CBC  WBC 3.4 - 10.8 x10E3/uL 6.8  5.3  4.9   Hemoglobin 13.0 - 17.7 g/dL 40.9  81.1  91.4   Hematocrit 37.5 - 51.0 % 43.9  44.2  44.8   Platelets 150 - 450 x10E3/uL 109  124  100    Lab Results  Component Value Date   MCV 92 02/18/2022   MCV 92 01/18/2022   MCV 92 01/11/2022   Lab Results  Component Value Date   TSH 1.080 01/11/2022   Lab Results  Component Value Date   HGBA1C  05/08/2009    4.6 (NOTE) The ADA recommends the following therapeutic goal for glycemic control related to Hgb A1c  measurement: Goal of therapy: <6.5 Hgb A1c  Reference: American Diabetes Association: Clinical Practice Recommendations 2010, Diabetes Care, 2010, 33: (Suppl  1).   BNP No results found for: "PROBNP"  Lipid Panel     Component Value Date/Time   CHOL 120 01/11/2022 1057   TRIG 91 01/11/2022 1057   HDL 39 (L) 01/11/2022 1057   CHOLHDL 3.1 01/11/2022 1057   CHOLHDL 3.5 03/28/2017 0925   VLDL 26 03/28/2017 0925   LDLCALC 63 01/11/2022 1057     RADIOLOGY: No results found.  IMPRESSION:  1. OSA on CPAP   2. Atrial flutter, unspecified type [facility]): Status post ablation Mar 19, 2022   3. Essential hypertension   4. S/P MVR (mitral valve repair)   5. Premature atrial contractions   6. Sinus arrhythmia      ASSESSMENT AND PLAN: Mr. Arth Hedinger is a 77 year-old gentleman who has a remote history of SBE and mitral valve endocarditis for which he required mitral valve repair surgery with quadrangular resection of his P2 segment of the mitral valve at which time a 32 mm Edwards annuloplasty ring was inserted in 2010.  An echo Doppler study in March 2017 showed stable mitral valve repair without stenosis or regurgitation.  He had normal systolic function with mild LVH.  There was evidence for mild aortic stenosis with a mean gradient of 11.  There was evidence for biatrial enlargement.  When I saw him in March 2018, he was hypertensive and I further titrated benazapril from 20 ro 40 mg. An echo Doppler study from 02/13/2017 continued to show a ejection fraction at 55-60%.  There was trivial aortic insufficiency.  His mitral valve annuloplasty ring was present and function normally.  There was no MR.  He had biatrial enlargement.  He wore a 2-week monitor in May 2021 after being found to have a slow heart rate.  His predominant rhythm was sinus rhythm.  The slowest heart rate was sinus bradycardia at 39 which occurred while sleeping and he was also noticed to have short bursts of SVT with the fastest  interval lasting 5 beats with a maximum rate of 164 and the longest interval lasting 18 beats with an average rate at 118.  He had remotely been evaluated by Dr. Ladona Ridgel for bradycardia.  At his last evaluation with me in February 2023 he was in atrial flutter with variable block.  He subsequently has undergone successful a flutter ablation on Mar 19, 2022 by Dr. Ladona Ridgel.  Subsequently he has not been noted to have any recurrent A-fib.  He has had sinus rhythm with sinus arrhythmia noted on subsequent ECGs.  Presently, he is doing well.  His blood pressure is stable and on repeat by me was 128/80 on benazepril 40 mg daily.  He has a prescription for metoprolol to tartrate to take 12-1/2 to 25 mg as needed for palpitations.  He continues to be on atorvastatin and Zetia for hyperlipidemia.  He has not had recent laboratory and I am recommending fasting labs with a comprehensive metabolic panel, CBC, TSH and lipid studies.  He continues to use CPAP therapy.  He received a new [employer] AirSense 11 AutoSet unit on January 28, 2022.  His pressure has been set at a range of 5 to 15 cm of water.  A download from April 4  through Mar 14, 2023 shows 100% use with average use at 6 hours and 9 minutes.  AHI is 3.4 with 95th percentile pressure 11.3 with maximum average pressure 12.2.  He is now linked to our office.  I have suggested he increase his ramp start pressure up to 6.  I will change his pressure range from 5-15 up to 9 to 15 cm of water based on his most recent download.  I have recommended in 6 months he have a follow-up echo Doppler reassessment and I will see him in the office for follow-up evaluation.    Lennette Bihari, MD, [facility] 03/21/2023 9:17 AM

## 2023-03-21 ENCOUNTER — Encounter: Payer: Self-pay | Admitting: Cardiovascular Disease

## 2023-03-27 LAB — COMPREHENSIVE METABOLIC PANEL
ALT: 23 IU/L (ref 0–44)
AST: 21 IU/L (ref 0–40)
Albumin/Globulin Ratio: 1.8 (ref 1.2–2.2)
Albumin: 4.3 g/dL (ref 3.8–4.8)
Alkaline Phosphatase: 52 IU/L (ref 44–121)
BUN/Creatinine Ratio: 11 (ref 10–24)
BUN: 12 mg/dL (ref 8–27)
Bilirubin Total: 0.5 mg/dL (ref 0.0–1.2)
CO2: 21 mmol/L (ref 20–29)
Calcium: 9.2 mg/dL (ref 8.6–10.2)
Chloride: 104 mmol/L (ref 96–106)
Creatinine, Ser: 1.06 mg/dL (ref 0.76–1.27)
Globulin, Total: 2.4 g/dL (ref 1.5–4.5)
Glucose: 83 mg/dL (ref 70–99)
Potassium: 4.6 mmol/L (ref 3.5–5.2)
Sodium: 140 mmol/L (ref 134–144)
Total Protein: 6.7 g/dL (ref 6.0–8.5)
eGFR: 72 mL/min/{1.73_m2} (ref >59)

## 2023-03-27 LAB — LIPID PANEL
Chol/HDL Ratio: 3.1 ratio (ref 0.0–5.0)
Cholesterol, Total: 141 mg/dL (ref 100–199)
HDL: 45 mg/dL (ref >39)
LDL Chol Calc (NIH): 76 mg/dL (ref 0–99)
Triglycerides: 109 mg/dL (ref 0–149)
VLDL Cholesterol Cal: 20 mg/dL (ref 5–40)

## 2023-03-27 LAB — CBC
Hematocrit: 44.2 % (ref 37.5–51.0)
Hemoglobin: 14.7 g/dL (ref 13.0–17.7)
MCH: 30.5 pg (ref 26.6–33.0)
MCHC: 33.3 g/dL (ref 31.5–35.7)
MCV: 92 fL (ref 79–97)
Platelets: 122 10*3/uL — ABNORMAL LOW (ref 150–450)
RBC: 4.82 x10E6/uL (ref 4.14–5.80)
RDW: 12.1 % (ref 11.6–15.4)
WBC: 6.5 10*3/uL (ref 3.4–10.8)

## 2023-03-27 LAB — TSH: TSH: 1.53 u[IU]/mL (ref 0.450–4.500)

## 2023-04-15 ENCOUNTER — Ambulatory Visit (HOSPITAL_COMMUNITY): Payer: Medicare Other | Attending: Internal Medicine

## 2023-04-15 DIAGNOSIS — G4733 Obstructive sleep apnea (adult) (pediatric): Secondary | ICD-10-CM | POA: Diagnosis present

## 2023-04-15 DIAGNOSIS — I4892 Unspecified atrial flutter: Secondary | ICD-10-CM | POA: Diagnosis present

## 2023-04-15 LAB — ECHOCARDIOGRAM COMPLETE
AR max vel: 2.35 cm2
AV Area VTI: 2.25 cm2
AV Area mean vel: 2.25 cm2
AV Mean grad: 8.8 mmHg
AV Peak grad: 16.3 mmHg
Ao pk vel: 2.02 m/s
Area-P 1/2: 2.29 cm2
P 1/2 time: 644 msec
S' Lateral: 3.4 cm

## 2023-04-17 ENCOUNTER — Other Ambulatory Visit: Payer: Self-pay | Admitting: Cardiovascular Disease

## 2023-06-18 ENCOUNTER — Other Ambulatory Visit: Payer: Self-pay | Admitting: Cardiovascular Disease

## 2023-07-22 ENCOUNTER — Other Ambulatory Visit: Payer: Self-pay | Admitting: Cardiovascular Disease

## 2023-07-22 NOTE — Telephone Encounter (Signed)
Prescription refill request for Eliquis received. Pt no longer taking this medication. Called pt to confirm.

## 2023-07-28 ENCOUNTER — Other Ambulatory Visit: Payer: Self-pay | Admitting: Cardiovascular Disease

## 2023-09-18 ENCOUNTER — Ambulatory Visit: Payer: Medicare Other | Admitting: Cardiovascular Disease

## 2023-10-13 ENCOUNTER — Ambulatory Visit: Payer: Medicare Other | Attending: Cardiovascular Disease | Admitting: Cardiovascular Disease

## 2023-10-13 ENCOUNTER — Encounter: Payer: Self-pay | Admitting: Cardiovascular Disease

## 2023-10-13 DIAGNOSIS — G4733 Obstructive sleep apnea (adult) (pediatric): Secondary | ICD-10-CM

## 2023-10-13 DIAGNOSIS — Z9889 Other specified postprocedural states: Secondary | ICD-10-CM

## 2023-10-13 DIAGNOSIS — I4892 Unspecified atrial flutter: Secondary | ICD-10-CM | POA: Diagnosis not present

## 2023-10-13 DIAGNOSIS — I491 Atrial premature depolarization: Secondary | ICD-10-CM

## 2023-10-13 DIAGNOSIS — I10 Essential (primary) hypertension: Secondary | ICD-10-CM | POA: Diagnosis not present

## 2023-10-13 DIAGNOSIS — E785 Hyperlipidemia, unspecified: Secondary | ICD-10-CM

## 2023-10-13 DIAGNOSIS — Z8679 Personal history of other diseases of the circulatory system: Secondary | ICD-10-CM

## 2023-10-13 DIAGNOSIS — I495 Sick sinus syndrome: Secondary | ICD-10-CM

## 2023-10-13 MED ORDER — METOPROLOL SUCCINATE ER 25 MG PO TB24: 12.5000 mg | ORAL_TABLET | Freq: Every day | ORAL | 3 refills | Status: DC

## 2023-10-13 NOTE — Patient Instructions (Addendum)
Medication Instructions:  Your physician has recommended you make the following change in your medication:   -Start metoprolol succinate (Toprol- XL) 12.5mg  once daily.   *If you need a refill on your cardiac medications before your next appointment, please call your pharmacy*   Lab Work: Your physician recommends that you return for lab work in: next week or 2 for FASTING CMET, Lipids, LP(a)  If you have labs (blood work) drawn today and your tests are completely normal, you will receive your results only by: MyChart Message (if you have MyChart) OR A paper copy in the mail If you have any lab test that is abnormal or we need to change your treatment, we will call you to review the results.   Testing/Procedures: Non-Cardiac CT Angiography (CTA) Chest, is a special type of CT scan that uses a computer to produce multi-dimensional views of major blood vessels throughout the body. In CT angiography, a contrast material is injected through an IV to help visualize the blood vessels    Follow-Up: At [facility], you and your health needs are our priority.  As part of our continuing mission to provide you with exceptional heart care, we have created designated Provider Care Teams.  These Care Teams include your primary Cardiologist (physician) and Advanced Practice Providers (APPs -  Physician Assistants and Nurse Practitioners) who all work together to provide you with the care you need, when you need it.  We recommend signing up for the patient portal called "MyChart".  Sign up information is provided on this After Visit Summary.  MyChart is used to connect with patients for Virtual Visits (Telemedicine).  Patients are able to view lab/test results, encounter notes, upcoming appointments, etc.  Non-urgent messages can be sent to your provider as well.   To learn more about what you can do with MyChart, go to [email or web address].    Your next appointment:   4-5  month(s)  Provider:   Nicki Guadalajara, MD     Other Instructions Sleep Coordinator: Ashley Mariner- [phone] [facility] Clinic

## 2023-10-13 NOTE — Progress Notes (Signed)
Patient ID: Edgar Jones, male   DOB: 1946-11-06, 77 y.o.   MRN: [id number]       Primary M.D.: Dr. Marjory Lies  HPI: Edgar Jones is a 77 year old former a patient of Dr. Alanda Amass. He presents for a 6 month follow-up evaluation.   Edgar Jones underwent mitral valve repair or in June 2010 with a 32 mm Edwards annuplasty plasty ring and quadrangular resection of P2 segment of the mitral valve which was done by Dr. Dorris Fetch. 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 At that time he had normal coronary arteries. He has a history of obstructive sleep apnea since 2010 and has been on CPAP therapy and admits to 100% use. There is also a history of hypertension as well as hyperlipidemia in addition to GERD.  When I saw initially saw him in January 2015, he denied any episodes of chest pain. He noted mild episodes intermittently and feels at times she is unable to get a deep breath. An echo Doppler study in April 2014 confirmed normal systolic function with an ejection fraction of 55-60%. Left atrium was mildly dilated. His mitral valve repair and annuloplasty ring were stable there was no evidence for prolapse and only trivial regurgitation. He had mild TR.  Prior to initially seeing him in September 2015 he had experienced 2 transient events associated with transient right hand twitching and weakness as well as another episode of "total "vision.  He he denied any dizziness or he saw Dr. Lesia Sago.  Carotid studies did not demonstrate any significant abnormality.  An MRI of his brain did not reveal any acute intracranial abnormality.  There was expected evolution of the left occipital lobe hemorrhage and small right parietal cortex infarct, which previously had been detected in 2010.  There was evidence for mild interval increase microhemorrhages in both cerebral hemispheres.  A repeat echo Doppler study was also done in June 2015,  which showed an ejection fraction in the range of 60-65%.  There was probable to diastolic dysfunction.  There is trivial aortic regurgitation.  His mitral valve repair was intact with moderately thickened leaflets.  His atrium was dilated.  His right ventricle was mildly dilated.  It was felt by Dr. Anne Hahn that he possibly he may have had a TIA, but this was not definitive.  The possibility of a future loop recorder was discussed if the patient had recurrent symptomatology.  The patient denies any recurrent symptoms.  He denies any awareness of any palpitations.  He denies chest pain.  He denies fever or shortness of breath.  He has a history of hypertension and has hyperlipidemia on simvastatin 40 mg.  There is a history of GERD for which she is on Prilosec.  He also has obstructive sleep apnea and continues to use CPAP with 100% compliance.   When seen in September 2016 he continued to be stable and denied any chest pain and had experienced mild occasional shortness of breath, not typically exertionally precipitated.  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  He has a history of GERD but this has been controlled with omeprazole.  He has been taking simvastatin 40 mg daily for hyperlipidemia. He has been on been  omeprazole 20 mg daily for high blood pressure.  Several years ago his triglycerides were elevated and omega-3 fatty acid was added to his lipid lowering  therapy.    On 01/17/2016 he underwent a follow-up echo Doppler study. This showed an EF of 60-65% with mild LVH. There was very mild aortic stenosis with a mean gradient of 11 mm. Aortic root measured 38 mm. He had severe left atrial dilatation and moderate right atrial dilatation..  Pulmonary pressures were normal with a PA pressure at 27 mm.  His mitral valve repair was stable without significant stenosis.   He continues to drive a [name] and needs certified driving license. Laboratory by his  primary in January 2018: total cholesterol 202, triglycerides 225, HDL 39, and LDL 136 with a non-HDL of 161.  When I last saw him, I discontinued simvastatin, and in its place started rosuvastatin 20 mg.  He underwent a one-year follow-up echo Doppler study on 02/13/2017.  He has normal systolic function without regional wall motion abnormalities.  There was trivial aortic insufficiency.  His mitral annular ring prosthesis was present and function normally.  There was no Edgar.  He had severe LA dilatation and mild right a dilatation.  Subsequent  laboratory in May 2018 showed improvement in lipids, such that total cholesterol is now 149, triglycerides 130, HDL 42, and LDL 81.  Dates.  He still has been having red meat 2-3 times per week, which is usually a clean cut.  He also has fish and chicken.  LFTs were normal.    I saw him in May 2019 and over the prior year he had continued to be stable.  He specifically denied any  chest pain or shortness of breath.  He denied any significant palpitation.  He continued to use CPAP with 100% compliance and is followed at [facility].  He continues to drive a truck.  He's asymptomatic with reference to dizziness, presyncope or syncope.  He had undergone a follow-up neurologic evaluation in April 2019 and was stable.  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  He was evaluated by Edd Fabian, NP in May and in June 2021.  He had been evaluated by his primary care physician in May 2021 and his heart rate was bradycardic in the upper 40s.  His ECG showed sinus rhythm with first-degree AV block with PACs.  He wore a Zio patch monitor which showed predominant rhythm at 67 bpm with only had some low heart rates with the lowest heart rate at 39 bpm which occurred while sleeping at 3:53 AM.  There also were  occasional very short bursts of SVT.  He saw Edd Fabian in follow-up.  His last echo Doppler study in December 2020 showed an EF of 55 to 60%.  There was mild aortic valve stenosis with a mean gradient of 12 and valve area of 1.9 cm.  I saw him in February 2022.  Time he denied any chest pain, presyncope, or syncope.  He was experiencing occasional palpitations and admitted to decreased energy level.  He continued to use CPAP and received a new machine approximately 3 years ago.  He had been recently started on Zetia to take in addition to his atorvastatin 40 mg with an LDL cholesterol of 117 and also has been taking over-the-counter fish oil.  He has been on benazepril 40 mg daily for hypertension and takes omeprazole for GERD.  I recommended that he have a follow-up echo Doppler study to  reassess systolic and diastolic function as well as aortic valve disease.  His ECG showed sinus rhythm with marked sinus arrhythmia, PVCs, first-degree AV block and a ventricular rate in the 40s.  I suggested he wear a 2-week event monitor.  I saw him on Mar 27, 2021.  He wore a Zio patch monitor from February 10 through January 04, 2021.  The predominant rhythm was sinus rhythm with average rate at 63.  His slowest heart rate was sinus bradycardia at 30 bpm which occurred while sleeping at 1:58 AM on December 30, 2020.  The maximum rate was 154 bpm.  He had 3 episodes of nonsustained VT with the fastest interval of 4 beats at a maximum rate of 154 and the longest lasted 4 beats with an average rate of 123.  He had 22 brief episodes of SVT with the fastest interval lasting 7 beats with a maximum rate of 144 and the longest interval lasting 16 beats with an average rate of 103.  He had frequent PACs, occasional atrial couplets, isolated PVCs with rare couplets and triplets.  He also had ventricular bigeminy.  With his history of episodes I did not start him on daily metoprolol but recommended heat take metoprolol tartrate  12.5 mg to 25 mg on an as-needed basis for palpitations or bursts of SVT.  He denies any chest pain.  His echo Doppler study on January 11, 2021 showed low normal EF at 50 to 55% without wall motion abnormalities.  There was mild LVH.  There was a mitral annuloplasty ring and mild residual mitral regurgitation.  There was mild to moderate aortic sclerosis without stenosis.  He continues to use CPAP.  I obtained a new download from March 14 through February 20, 2021.  Compliance is 100% with average use 5 hours and 46 minutes.  His pressure setting is a range of 5 to 15 cm and his 95th percentile pressure is 11.7 with maximum average pressure 13.1.  AHI is 3.2.  His CPAP set up date was 2015.  As result he has a 3G CPAP unit and since mid April 2022 this was no longer is downloadable wirelessly due to the change to 5G.    I recommended that he have an EP evaluation and on June 22nd, 2022 he saw Dr. 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  His cardiac monitor showed mostly nighttime bradycardia as well as frequent PACs and some PVCs but very brief nonsustained atrial tachycardia.  At that time, his symptoms and findings on his event monitor were reassuring and it was felt that his pacemaker was not necessary and if he did develop worsening symptoms he may be a candidate for a loop recorder.  I last saw him on January 01, 2022.  Since his prior evaluation, he continues to drive a dump truck.  He denies any dizziness or lightheadedness.  He is unaware of any significant palpitations.  We were able to obtain a download from his DME company which continue to show excellent compliance from November 1 through December 09, 2021 with average CPAP use of 6 hours and 19 minutes.  AHI is 3.8 and his pressure range is set at 5 to 15 cm of water.  His 95th percentile pressure was 12.0.  His CPAP is followed at [facility].  During that evaluation, he was in atrial flutter and I recommended he see Dr. Ladona Ridgel for  further evaluation and potential additional treatment for consideration of atrial flutter candidacy.  He  subsequently underwent atrial flutter catheter ablation by Dr. Ladona Ridgel on Mar 19, 2022 and saw him in follow-up on July 30, 2022.  He recently was seen by Maxine Glenn for EP evaluation on February 13, 2023.  ECG showed sinus rhythm with frequent PACs versus sinus arrhythmia.  I last saw him on Mar 17, 2023 at which time he continued to feel well.   He is still working.  He denies chest pain or shortness of breath.  He continues to use CPAP.   He is on benazepril 40 mg, and metoprolol tartrate on an as-needed basis.  He is on Zetia 10 mg and atorvastatin 40 mg for hyperlipidemia. He takes baby aspirin and takes omeprazole for GERD.    I last saw him, Edgar Jones continues to feel well and denies any chest pain or shortness of breath.  He has some concerns since his brother at age 90 died of lung cancer in 2022/05/28.  Remotely, Edgar Jones had smoked from age 70 until age 53 but quit smoking at that time.  You have old records prior to me seeing him was done in January 2012 he had a CT which raise concern for possible mild emphysematous changes affecting both lungs.  Within the right middle lobe there was a small 4 mm small nodule.  He had atelectasis of the right lung base.  It was evidence for multilevel spondylosis.  Presently, he continues to be followed at [facility] practice which is part of [facility].  He denies any chest pain or significant shortness of breath.  He is on benazepril 40 mg hypertension and has a prescription for metoprolol to tartrate 25 mg to take 1/2 to 1 tablet by mouth as needed for palpitations.  He is on atorvastatin 40 mg and Zetia 10 mg for hyperlipidemia.  He continues to be on baby aspirin.  He is on omeprazole for GERD.  He presents for evaluation.    Past Medical History:  Diagnosis Date   Dyslipidemia    Endocarditis    GERD (gastroesophageal reflux disease)     Heart murmur    History of stress test 11/22/2010   Perfusion defect in the inferior myocardial region is consistent with diaphragmatic attenuation. The remaining myocardium demonstrates normal myocardial perfusion with no evidence of ischemia or infarct. The post stress left ventricle is normal in size. The post stress EF 65%, Gobal left ventricle systolic function is normal. No significant wall motion abnormalities noted. Normal Myocardial perfus   Hx of echocardiogram 02/10/2013   The cavity size was normal. Ststolic function was normal. The estimated EF was in the range 55%-60%. Wall motion was normal; there was no regional wall motion abnormalities. Stable MV repair & annuloplasty ring. Trace Edgar   Hyperlipidemia    Hypertension    OSA on CPAP    SBE (subacute bacterial endocarditis)    Sleep apnea    Stroke [facility])    3/10 endocarditis/embolic    Past Surgical History:  Procedure Laterality Date   A-FLUTTER ABLATION N/A 03/19/2022   Procedure: A-FLUTTER ABLATION;  Surgeon: Marinus Maw, MD;  Location: [facility] INVASIVE CV LAB;  Service: Cardiovascular;  Laterality: N/A;   CARDIAC CATHETERIZATION  01/2009   COLONOSCOPY     HERNIA REPAIR     right abd   INGUINAL HERNIA REPAIR     left side   MITRAL VALVE REPAIR  04/2009   32mm Edwards annuloplasty ring and quadrangular resection pf P2 segment of the motral valve  by Dr Dorris Fetch with normal coronary arteries.   POLYPECTOMY     UPPER GASTROINTESTINAL ENDOSCOPY      No Known Allergies  Current Outpatient Medications  Medication Sig Dispense Refill   amoxicillin (AMOXIL) 500 MG capsule Take 2,000 mg by mouth See admin instructions. Take before dentist     aspirin EC 81 MG tablet Take 1 tablet (81 mg total) by mouth daily. Swallow whole. 90 tablet 3   atorvastatin (LIPITOR) 40 MG tablet TAKE 1 TABLET BY MOUTH DAILY 100 tablet 2   benazepril (LOTENSIN) 40 MG tablet TAKE 1 TABLET BY MOUTH DAILY 90 tablet 2   ezetimibe (ZETIA) 10 MG  tablet TAKE 1 TABLET BY MOUTH DAILY 100 tablet 2   ferrous sulfate 325 (65 FE) MG tablet Take 325 mg by mouth daily with breakfast.     ibuprofen (ADVIL) 600 MG tablet Take 600 mg by mouth every 6 (six) hours as needed for moderate pain or headache.     metoprolol succinate (TOPROL XL) 25 MG 24 hr tablet Take 0.5 tablets (12.5 mg total) by mouth daily. 45 tablet 3   metoprolol tartrate (LOPRESSOR) 25 MG tablet TAKE 1/2 TO 1 TABLET BY MOUTH  DAILY AS NEEDED FOR PALPITATIONS 100 tablet 2   Omega-3 Fatty Acids (FISH OIL) 1000 MG CAPS Take 2,000 mg by mouth daily.     omeprazole (PRILOSEC) 40 MG capsule TAKE ONE CAPSULE BY MOUTH EVERY DAY 30 capsule 6   No current facility-administered medications for this visit.    Socially he is married has 3 children and 2 grandchildren. There is a remote tobacco history but he quit over 30 years ago. 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 He is able to exercise without restriction. He does play golf and rides a bike.  Family History  Problem Relation Age of Onset   Heart disease Mother    Heart disease Father    Arthritis Father    Cirrhosis Brother    Hypertension Maternal Aunt    Colon polyps Brother    Hypertension Brother    Hyperlipidemia Brother    Colon cancer Neg Hx    Esophageal cancer Neg Hx    Stomach cancer Neg Hx    Rectal cancer Neg Hx    He tells me that his father died with an aortic aneurysm.  His sister had pancreatic cancer.  ROS General: Negative; No fevers, chills, or night sweats;  HEENT: Negative; No changes in vision or hearing, sinus congestion, difficulty swallowing Pulmonary: Negative; No cough, wheezing, shortness of breath, hemoptysis Cardiovascular: see HPI GI: Negative; No nausea, vomiting, diarrhea, or abdominal pain GU: Negative; No dysuria, hematuria, or difficulty voiding Musculoskeletal: Negative; no myalgias, joint pain, or  weakness Hematologic/Oncology: Negative; no easy bruising, bleeding Endocrine: Negative; no heat/cold intolerance; no diabetes Neuro: See history of present illness; no changes in balance, headaches Skin: Negative; No rashes or skin lesions Psychiatric: Negative; No behavioral problems, depression Sleep: Positive for sleep apnea on CPAP therapy;  He admits to 100% compliance; No snoring, daytime sleepiness, hypersomnolence, bruxism, restless legs, hypnogognic hallucinations, no cataplexy Other comprehensive 14 point system review is negative.   PE BP 134/78   Pulse 67   Ht 6\' 3"  (1.905 m)   Wt 226 lb 6.4 oz (102.7 kg)   SpO2 97%   BMI 28.30 kg/m    Repeat blood pressure by me was 142/78   Wt Readings from  Last 3 Encounters:  10/13/23 226 lb 6.4 oz (102.7 kg)  03/17/23 227 lb (103 kg)  02/13/23 228 lb (103.4 kg)   General: Alert, oriented, no distress.  Skin: normal turgor, no rashes, warm and dry HEENT: Normocephalic, atraumatic. Pupils equal round and reactive to light; sclera anicteric; extraocular muscles intact; Fundi ** Nose without nasal septal hypertrophy Mouth/Parynx benign; Mallinpatti scale 3 Neck: No JVD, no carotid bruits; normal carotid upstroke Lungs: Minimally decreased breath sounds, no wheezing or rales Chest wall: without tenderness to palpitation Heart: PMI not displaced, mild heart rate irregularity, s1 s2 normal, 1/6 systolic murmur, no diastolic murmur, no rubs, gallops, thrills, or heaves Abdomen: soft, nontender; no hepatosplenomehaly, BS+; abdominal aorta nontender and not dilated by palpation. Back: no CVA tenderness Pulses 2+ Musculoskeletal: full range of motion, normal strength, no joint deformities Extremities: no clubbing cyanosis or edema, Homan's sign negative  Neurologic: grossly nonfocal; Cranial nerves grossly wnl Psychologic: Normal mood and affect    EKG Interpretation Date/Time:  Monday October 13 2023 08:08:08 EST Ventricular  Rate:  67 PR Interval:  236 QRS Duration:  94 QT Interval:  390 QTC Calculation: 412 R Axis:   1  Text Interpretation: Sinus rhythm with marked sinus arrhythmia with 1st degree A-V block with Premature supraventricular complexes Nonspecific ST and T wave abnormality When compared with ECG of 19-Mar-2022 10:05, Questionable change in QRS axis Nonspecific T wave abnormality now evident in Lateral leads Confirmed by Nicki Guadalajara ([id number]) on 10/13/2023 8:44:38 AM    Mar 17, 2023 ECG (independently read by me): Sinus rhythm at 62 with sinus arrythmia, PACs, 1st degree AV block  January 01, 2022 ECG (independently read by me):  Atrial flutter with variable block at 51  Mar 27, 2021 ECG (independently read by me): Sinus bradycarda st 48, marked sinus arrythmia with 1.9 second pause;  1st degree AV block, PR 214  February 2022 ECG (independently read by me): Sinus rhythm with marked sinus arrhythmia, PACs, ventricular rate averaging 46 bpm. First-degree AV block with a PR interval at 220 ms. Inferolateral T wave abnormality.  May 2017 ECG (independently read by me): Sinus bradycardia at 48 bpm with mild sinus arrhythmia and first-degree AV block.  Isolated PAC.  PR interval 232 ms.  QTc interval 394 ms.  May 2018 ECG (independently read by me): Normal sinus rhythm at 60 bpm.  First degree AV block.  PAC.  PR interval 214 ms per QTc interval 394 ms.  March 2018 ECG (independently read by me): Normal sinus rhythm at 63 bpm with sinus arrhythmia, first-degree AV block, nonspecific T changes.  March 2017 ECG (independently read by me):  Sinus bradycardia 50 bpm with first-degree block.  No ectopy. Mild T wave abnormality inferiorly.  September 2016 ECG (independently read by me): Sinus bradycardia 53 bpm.  First-degree AV block with a PR interval at 224 ms.  ECG (independently read by me): Normal sinus rhythm at 63 bpm.  Occasional PVC.  First-degree AV block with a PR interval at 212.  September  2015 ECG (independently read by me): Sinus bradycardia 51 beats per minute.  Borderline first-degree V. block with a PR interval of 208 ms.  QTc interval 411 milliseconds  November 23, 2013 ECG (independently read by me): Sinus bradycardia with very mild first-degree block with a PR interval of 218 ms. No significant ST changes.  LABS:     Latest Ref Rng & Units 03/26/2023    2:09 PM 02/18/2022    3:32 PM  01/11/2022   10:57 AM  BMP  Glucose 70 - 99 mg/dL 83  87  81   BUN 8 - 27 mg/dL 12  12  16    Creatinine 0.76 - 1.27 mg/dL 1.61  0.96  0.45   BUN/Creat Ratio 10 - 24 11  11  14    Sodium 134 - 144 mmol/L 140  144  139   Potassium 3.5 - 5.2 mmol/L 4.6  4.6  4.9   Chloride 96 - 106 mmol/L 104  108  103   CO2 20 - 29 mmol/L 21  22  24    Calcium 8.6 - 10.2 mg/dL 9.2  9.1  9.3       Latest Ref Rng & Units 03/26/2023    2:09 PM 01/11/2022   10:57 AM 03/22/2021   10:57 AM  Hepatic Function  Total Protein 6.0 - 8.5 g/dL 6.7  6.9  6.4   Albumin 3.8 - 4.8 g/dL 4.3  4.6  4.3   AST 0 - 40 IU/L 21  17  25    ALT 0 - 44 IU/L 23  17  24    Alk Phosphatase 44 - 121 IU/L 52  50  47   Total Bilirubin 0.0 - 1.2 mg/dL 0.5  0.9  0.4       Latest Ref Rng & Units 03/26/2023    2:09 PM 02/18/2022    3:32 PM 01/18/2022   11:11 AM  CBC  WBC 3.4 - 10.8 x10E3/uL 6.5  6.8  5.3   Hemoglobin 13.0 - 17.7 g/dL 40.9  81.1  91.4   Hematocrit 37.5 - 51.0 % 44.2  43.9  44.2   Platelets 150 - 450 x10E3/uL 122  109  124    Lab Results  Component Value Date   MCV 92 03/26/2023   MCV 92 02/18/2022   MCV 92 01/18/2022   Lab Results  Component Value Date   TSH 1.530 03/26/2023   Lab Results  Component Value Date   HGBA1C  05/08/2009    4.6 (NOTE) The ADA recommends the following therapeutic goal for glycemic control related to Hgb A1c measurement: Goal of therapy: <6.5 Hgb A1c  Reference: American Diabetes Association: Clinical Practice Recommendations 2010, Diabetes Care, 2010, 33: (Suppl  1).   BNP No  results found for: "PROBNP"  Lipid Panel     Component Value Date/Time   CHOL 141 03/26/2023 1409   TRIG 109 03/26/2023 1409   HDL 45 03/26/2023 1409   CHOLHDL 3.1 03/26/2023 1409   CHOLHDL 3.5 03/28/2017 0925   VLDL 26 03/28/2017 0925   LDLCALC 76 03/26/2023 1409     RADIOLOGY: No results found.  IMPRESSION:  1. Essential hypertension   2. Premature atrial contractions   3. Atrial flutter, unspecified type (HCC)   4. OSA on CPAP   5. S/P ablation of atrial flutter   6. Tachycardia-bradycardia syndrome (HCC)   7. Hyperlipidemia LDL goal <70   8. S/P MVR (mitral valve repair)      ASSESSMENT AND PLAN: Edgar. Brees Jones is a 77 year-old gentleman who has a remote history of SBE and mitral valve endocarditis for which he required mitral valve repair surgery with quadrangular resection of his P2 segment of the mitral valve at which time a 32 mm Edwards annuloplasty ring was inserted in 2010.  An echo Doppler study in March 2017 showed stable mitral valve repair without stenosis or regurgitation.  He had normal systolic function with mild LVH.  There was evidence  for mild aortic stenosis with a mean gradient of 11.  There was evidence for biatrial enlargement.  When I saw him in March 2018, he was hypertensive and I further titrated benazapril from 20 ro 40 mg. An echo Doppler study from 02/13/2017 continued to show a ejection fraction at 55-60%.  There was trivial aortic insufficiency.  His mitral valve annuloplasty ring was present and function normally.  There was no Edgar.  He had biatrial enlargement.  He wore a 2-week monitor in May 2021 after being found to have a slow heart rate.  His predominant rhythm was sinus rhythm.  The slowest heart rate was sinus bradycardia at 39 which occurred while sleeping and he was also noticed to have short bursts of SVT with the fastest interval lasting 5 beats with a maximum rate of 164 and the longest interval lasting 18 beats with an average rate at  118.  He had remotely been evaluated by Dr. Ladona Ridgel for bradycardia.  At his last evaluation with me in February 2023 he was in atrial flutter with variable block.  He subsequently underwent successful atrial flutter ablation on Mar 19, 2022 by Dr. Ladona Ridgel.  On exam today, he does have heart rate irregularity and as sinus rhythm with significant sinus arrhythmia.  I have suggested initiating very low-dose metoprolol succinate at 12.5 mg which would be helpful both for more optimal blood pressure control, as well as heart rate stability.  Will also keep his metoprolol tartrate to take on an as needed basis for palpitations.  With his brother developing lung cancer, he has concerns regarding his chance of developing lung CA.  I was able to review a remote chest CT from 2012.  Presently I am recommending he undergo a follow-up chest CT for reassessment of his previously noted mild suggestion of emphysematous changes and to further evaluate any potential lung nodule.  He continues to be on atorvastatin and Zetia for hyperlipidemia..  Recent lipid panel on Mar 26, 2023 showed total cholesterol 141, HDL 45, LDL 76 and triglycerides 109.  He is not fasting today.  I have recommended he return in the fasting state to have follow-up c-Met, lipid panel and I will also check LP(a).  Pending upon results, more aggressive treatment may be necessary particularly if LP(a) is elevated.  With reference to his sleep apnea, he received a new ResMed AirSense 11 AutoSet unit on January 28, 2022.  May evaluation, I made adjustment to his CPAP settings and changed his pressure range to 9 to 15 cm of water.  Obtained a download from November 2 through October 12, 2023.  Compliance is excellent with 100% use and average use at 7 hours and 2 minutes.  He does have considerable mask leak on a daily basis and has been using a nasal mask.  At his pressure range of 9 to 15 cm of water, 95th percentile pressure is 12.8 with maximum average pressure  13.9.  AHI is 5.4.  I am changing his ramp start pressure to 7 and will change his auto set pressure to a range of 11 to 16 cm of water.  I will contact him regarding the results of his chest CT and upcoming laboratory.  I will see him in 4 to 5 months for follow-up evaluation or sooner as needed.    Lennette Bihari, MD, [facility] 10/13/2023 12:38 PM

## 2023-10-15 LAB — COMPREHENSIVE METABOLIC PANEL
ALT: 23 [IU]/L (ref 0–44)
AST: 24 [IU]/L (ref 0–40)
Albumin: 4.4 g/dL (ref 3.8–4.8)
Alkaline Phosphatase: 51 [IU]/L (ref 44–121)
BUN/Creatinine Ratio: 13 (ref 10–24)
BUN: 15 mg/dL (ref 8–27)
Bilirubin Total: 0.7 mg/dL (ref 0.0–1.2)
CO2: 23 mmol/L (ref 20–29)
Calcium: 9.6 mg/dL (ref 8.6–10.2)
Chloride: 101 mmol/L (ref 96–106)
Creatinine, Ser: 1.17 mg/dL (ref 0.76–1.27)
Globulin, Total: 2.5 g/dL (ref 1.5–4.5)
Glucose: 88 mg/dL (ref 70–99)
Potassium: 4.6 mmol/L (ref 3.5–5.2)
Sodium: 139 mmol/L (ref 134–144)
Total Protein: 6.9 g/dL (ref 6.0–8.5)
eGFR: 64 mL/min/{1.73_m2} (ref >59)

## 2023-10-15 LAB — LIPID PANEL
Chol/HDL Ratio: 3.4 {ratio} (ref 0.0–5.0)
Cholesterol, Total: 150 mg/dL (ref 100–199)
HDL: 44 mg/dL (ref >39)
LDL Chol Calc (NIH): 84 mg/dL (ref 0–99)
Triglycerides: 125 mg/dL (ref 0–149)
VLDL Cholesterol Cal: 22 mg/dL (ref 5–40)

## 2023-10-15 LAB — LIPOPROTEIN A (LPA): Lipoprotein (a): 13.7 nmol/L (ref <75.0)

## 2023-10-18 ENCOUNTER — Ambulatory Visit (HOSPITAL_BASED_OUTPATIENT_CLINIC_OR_DEPARTMENT_OTHER)
Admission: RE | Admit: 2023-10-18 | Discharge: 2023-10-18 | Disposition: A | Discharge status: Home / Self Care | Payer: Medicare Other | Source: Ambulatory Visit | Attending: Cardiovascular Disease | Admitting: Cardiovascular Disease

## 2023-10-18 DIAGNOSIS — I495 Sick sinus syndrome: Secondary | ICD-10-CM | POA: Insufficient documentation

## 2023-10-18 DIAGNOSIS — E785 Hyperlipidemia, unspecified: Secondary | ICD-10-CM | POA: Insufficient documentation

## 2023-10-18 DIAGNOSIS — I10 Essential (primary) hypertension: Secondary | ICD-10-CM | POA: Diagnosis present

## 2023-10-18 DIAGNOSIS — G4733 Obstructive sleep apnea (adult) (pediatric): Secondary | ICD-10-CM | POA: Diagnosis present

## 2023-10-18 DIAGNOSIS — I491 Atrial premature depolarization: Secondary | ICD-10-CM | POA: Insufficient documentation

## 2023-10-18 DIAGNOSIS — I4892 Unspecified atrial flutter: Secondary | ICD-10-CM | POA: Insufficient documentation

## 2023-10-18 MED ORDER — IOHEXOL 350 MG/ML SOLN
80.0000 mL | Freq: Once | INTRAVENOUS | Status: AC | PRN
  Administered 2023-10-18: 80 mL via INTRAVENOUS

## 2023-10-22 ENCOUNTER — Telehealth: Payer: Self-pay | Admitting: Cardiovascular Disease

## 2023-10-22 NOTE — Telephone Encounter (Signed)
*  STAT* If patient is at the pharmacy, call can be transferred to refill team.   1. Which medications need to be refilled? (please list name of each medication and dose if known) metoprolol succinate (TOPROL-XL) 25 MG 24 hr tablet   2. Which pharmacy/location (including street and city if local pharmacy) is medication to be sent to? [facility] - [place], [place] - [address]   3. Do they need a 30 day or 90 day supply? 90

## 2023-10-22 NOTE — Telephone Encounter (Signed)
Called and spoke to patient. Below results relayed per Dr Tresa Endo. Patient verbalized understanding. No questions at this time.   Lennette Bihari, MD 10/20/2023  9:17 AM EST   CT angio of chest negative for acute PE or thoracic aortic dissection.  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  Aortic atherosclerosis.  Previous mitral valve surgery, coronary calcification, status post CABG and aortic valve calcification.  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

## 2023-10-22 NOTE — Telephone Encounter (Signed)
Calling in about his CT scan. Please advise

## 2023-11-06 ENCOUNTER — Telehealth: Payer: Self-pay | Admitting: Cardiovascular Disease

## 2023-11-06 MED ORDER — METOPROLOL SUCCINATE ER 25 MG PO TB24: 12.5000 mg | ORAL_TABLET | Freq: Every day | ORAL | 1 refills | Status: AC

## 2023-11-06 NOTE — Telephone Encounter (Signed)
Pt's medication was sent to pt's pharmacy as requested. Confirmation received.  °

## 2023-11-06 NOTE — Telephone Encounter (Signed)
*  STAT* If patient is at the pharmacy, call can be transferred to refill team.   1. Which medications need to be refilled? (please list name of each medication and dose if known) metoprolol succinate (TOPROL XL) 25 MG 24 hr tablet   2. Which pharmacy/location (including street and city if local pharmacy) is medication to be sent to?  [id number] [place] [address], [place] [phone]    3. Do they need a 30 day or 90 day supply? 90

## 2023-12-26 ENCOUNTER — Telehealth: Payer: Self-pay | Admitting: Cardiovascular Disease

## 2023-12-26 NOTE — Telephone Encounter (Signed)
Received a call from patient he stated the past couple of weeks he has noticed low pulse.Stated has been in 30's to 40's.B/P yesterday 130/80.Stated he feels fine.Spoke to DOD Dr.Hilty he advised to stop taking Metoprolol.Advised to monitor pulse and B/P and bring readings to next appointment with Dr.Kelly 4/1.

## 2023-12-26 NOTE — Telephone Encounter (Signed)
STAT if HR is under 50 or over 120 (normal HR is 60-100 beats per minute)  What is your heart rate? 42  Do you have a log of your heart rate readings (document readings)? Sometimes running in the 30s  Do you have any other symptoms? No - Believes this is caused by new medication  Metoprolol Succinate

## 2023-12-27 ENCOUNTER — Other Ambulatory Visit: Payer: Self-pay | Admitting: Cardiovascular Disease

## 2024-01-09 ENCOUNTER — Other Ambulatory Visit: Payer: Self-pay | Admitting: Cardiovascular Disease

## 2024-02-06 NOTE — Progress Notes (Signed)
  Electrophysiology Office Note:   Date:  02/09/2024  ID:  ESAUL DORWART, DOB 17-Nov-1945, MRN [id number]  Primary Cardiologist: Nicki Guadalajara, MD Electrophysiologist: Lewayne Bunting, MD      History of Present Illness:   Edgar Jones is a 78 y.o. 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   Since last being seen in our clinic the patient reports doing well. Overall, he denies chest pain, palpitations, dyspnea, PND, orthopnea, nausea, vomiting, dizziness, syncope, edema, weight gain, or early satiety.   Review of systems complete and found to be negative unless listed in HPI.   EP Information / Studies Reviewed:    EKG is ordered today. Personal review as below.  EKG Interpretation Date/Time:  Monday February 09 2024 08:09:36 EDT Ventricular Rate:  57 PR Interval:    QRS Duration:  94 QT Interval:  400 QTC Calculation: 389 R Axis:   14  Text Interpretation: Sinus bradycardia Nonspecific T wave abnormality Confirmed by Maxine Glenn [phone]) on 02/09/2024 8:21:59 AM    Arrhythmia/Device History S/p CTI 03/2022   Physical Exam:   VS:  BP 130/88 (BP Location: Left Arm, Patient Position: Sitting, Cuff Size: Normal)   Pulse (!) 59   Resp 16   Ht 6\' 3"  (1.905 m)   Wt 227 lb 9.6 oz (103.2 kg)   SpO2 95%   BMI 28.45 kg/m    Wt Readings from Last 3 Encounters:  02/09/24 227 lb 9.6 oz (103.2 kg)  10/13/23 226 lb 6.4 oz (102.7 kg)  03/17/23 227 lb (103 kg)     GEN: No acute distress NECK: No JVD; No carotid bruits CARDIAC: Regular rate and rhythm, no murmurs, rubs, gallops RESPIRATORY:  Clear to auscultation without rales, wheezing or rhonchi  ABDOMEN: Soft, non-tender, non-distended EXTREMITIES:  No edema; No deformity   ASSESSMENT AND PLAN:    H/o Atrial flutter Sinus bradycardia S/p CTI 03/2022 EKG today shows sinus brady with frequent PACs vs sinus arrhythmia  No h/o AF; Not on OAC.  Understands he would need to start if AF identified in the future.   HTN Stable on current regimen  HLD Continue statin  OSA  Encouraged nightly CPAP    Follow up with EP APP in 12 months  Signed, Graciella Freer, PA-C

## 2024-02-09 ENCOUNTER — Encounter: Payer: Self-pay | Admitting: Student

## 2024-02-09 ENCOUNTER — Ambulatory Visit: Payer: Medicare Other | Attending: Student | Admitting: Student

## 2024-02-09 ENCOUNTER — Encounter (HOSPITAL_BASED_OUTPATIENT_CLINIC_OR_DEPARTMENT_OTHER): Payer: Self-pay

## 2024-02-09 VITALS — BP 130/88 | HR 59 | Resp 16 | Ht 75.0 in | Wt 227.6 lb

## 2024-02-09 DIAGNOSIS — I491 Atrial premature depolarization: Secondary | ICD-10-CM | POA: Diagnosis not present

## 2024-02-09 DIAGNOSIS — I10 Essential (primary) hypertension: Secondary | ICD-10-CM

## 2024-02-09 DIAGNOSIS — Z9889 Other specified postprocedural states: Secondary | ICD-10-CM | POA: Diagnosis not present

## 2024-02-09 DIAGNOSIS — Z8679 Personal history of other diseases of the circulatory system: Secondary | ICD-10-CM

## 2024-02-09 DIAGNOSIS — G4733 Obstructive sleep apnea (adult) (pediatric): Secondary | ICD-10-CM

## 2024-02-09 NOTE — Patient Instructions (Signed)
 Medication Instructions:  Your physician recommends that you continue on your current medications as directed. Please refer to the Current Medication list given to you today.  *If you need a refill on your cardiac medications before your next appointment, please call your pharmacy*  Lab Work: None ordered If you have labs (blood work) drawn today and your tests are completely normal, you will receive your results only by: MyChart Message (if you have MyChart) OR A paper copy in the mail If you have any lab test that is abnormal or we need to change your treatment, we will call you to review the results.  Follow-Up: At [facility], you and your health needs are our priority.  As part of our continuing mission to provide you with exceptional heart care, our providers are all part of one team.  This team includes your primary Cardiologist (physician) and Advanced Practice Providers or APPs (Physician Assistants and Nurse Practitioners) who all work together to provide you with the care you need, when you need it.  Your next appointment:   1 year(s)  Provider:   Casimiro Needle "Mardelle Matte" Lanna Poche, PA-C       1st Floor: - Lobby - Registration  - Pharmacy  - Lab - Cafe  2nd Floor: - PV Lab - Diagnostic Testing (echo, CT, nuclear med)  3rd Floor: - Vacant  4th Floor: - TCTS (cardiothoracic surgery) - AFib Clinic - Structural Heart Clinic - Vascular Surgery  - Vascular Ultrasound  5th Floor: - HeartCare Cardiology (general and EP) - Clinical Pharmacy for coumadin, hypertension, lipid, weight-loss medications, and med management appointments    Valet parking services will be available as well.

## 2024-02-10 ENCOUNTER — Ambulatory Visit: Payer: Medicare Other | Admitting: Cardiovascular Disease

## 2024-02-10 ENCOUNTER — Telehealth: Payer: Self-pay

## 2024-02-10 ENCOUNTER — Encounter: Payer: Self-pay | Admitting: Cardiovascular Disease

## 2024-02-10 VITALS — BP 120/70 | HR 44 | Ht 75.0 in | Wt 228.0 lb

## 2024-02-10 DIAGNOSIS — R001 Bradycardia, unspecified: Secondary | ICD-10-CM | POA: Diagnosis not present

## 2024-02-10 DIAGNOSIS — Z9889 Other specified postprocedural states: Secondary | ICD-10-CM

## 2024-02-10 DIAGNOSIS — G4733 Obstructive sleep apnea (adult) (pediatric): Secondary | ICD-10-CM

## 2024-02-10 DIAGNOSIS — I491 Atrial premature depolarization: Secondary | ICD-10-CM

## 2024-02-10 DIAGNOSIS — I4892 Unspecified atrial flutter: Secondary | ICD-10-CM

## 2024-02-10 DIAGNOSIS — I498 Other specified cardiac arrhythmias: Secondary | ICD-10-CM | POA: Diagnosis not present

## 2024-02-10 DIAGNOSIS — Z8679 Personal history of other diseases of the circulatory system: Secondary | ICD-10-CM

## 2024-02-10 DIAGNOSIS — E785 Hyperlipidemia, unspecified: Secondary | ICD-10-CM

## 2024-02-10 DIAGNOSIS — I10 Essential (primary) hypertension: Secondary | ICD-10-CM | POA: Diagnosis not present

## 2024-02-10 MED ORDER — HYDROCHLOROTHIAZIDE 12.5 MG PO CAPS: 12.5000 mg | ORAL_CAPSULE | ORAL | 3 refills | Status: AC

## 2024-02-10 NOTE — Patient Instructions (Signed)
 Medication Instructions:  Begin the hydrochlorothiazide 12.5mg . Take for swelling in the ankle. Take one tablet every other day. If swelling persist, take one tablet daily.   *If you need a refill on your cardiac medications before your next appointment, please call your pharmacy*   Lab Work: No labs were ordered during today's visit.  If you have labs (blood work) drawn today and your tests are completely normal, you will receive your results only by: MyChart Message (if you have MyChart) OR A paper copy in the mail If you have any lab test that is abnormal or we need to change your treatment, we will call you to review the results.   Testing/Procedures: Your physician has requested that you have an echocardiogram in January 2026. Echocardiography is a painless test that uses sound waves to create images of your heart. It provides your doctor with information about the size and shape of your heart and how well your heart's chambers and valves are working. This procedure takes approximately one hour. There are no restrictions for this procedure. Please do NOT wear cologne, perfume, aftershave, or lotions (deodorant is allowed). Please arrive 15 minutes prior to your appointment time.  Please note: We ask at that you not bring children with you during ultrasound (echo/ vascular) testing. Due to room size and safety concerns, children are not allowed in the ultrasound rooms during exams. Our front office staff cannot provide observation of children in our lobby area while testing is being conducted. An adult accompanying a patient to their appointment will only be allowed in the ultrasound room at the discretion of the ultrasound technician under special circumstances. We apologize for any inconvenience.    Follow-Up: At [facility], you and your health needs are our priority.  As part of our continuing mission to provide you with exceptional heart care, we have created designated  Provider Care Teams.  These Care Teams include your primary Cardiologist (physician) and Advanced Practice Providers (APPs -  Physician Assistants and Nurse Practitioners) who all work together to provide you with the care you need, when you need it.  We recommend signing up for the patient portal called "MyChart".  Sign up information is provided on this After Visit Summary.  MyChart is used to connect with patients for Virtual Visits (Telemedicine).  Patients are able to view lab/test results, encounter notes, upcoming appointments, etc.  Non-urgent messages can be sent to your provider as well.   To learn more about what you can do with MyChart, go to [email or web address].    Your next appointment:   10 month(s)  Provider:   Dr. Bjorn Pippin     Other Instructions [facility]  [address] [place], [place] [address] [address] [phone]       1st Floor: - Lobby - Registration  - Pharmacy  - Lab - Cafe   2nd Floor: - PV Lab - Diagnostic Testing (echo, CT, nuclear med)   3rd Floor: - Vacant   4th Floor: - TCTS (cardiothoracic surgery) - AFib Clinic - Structural Heart Clinic - Vascular Surgery  - Vascular Ultrasound   5th Floor: - HeartCare Cardiology (general and EP) - Clinical Pharmacy for coumadin, hypertension, lipid, weight-loss medications, and med management appointments      Valet parking services will be available as well.

## 2024-02-10 NOTE — Telephone Encounter (Signed)
 Per Dr. Tresa Endo, changes were made to pat c-pap pressures during today's visit.  Set Mode to AutoSet Set Min Pressure to 10 cmH2O Set Max Pressure to 16 cmH2O Set Response to Standard Set EPR to Fulltime Set EPR level to 2 Set Ramp enable to Auto Set Start pressure to 7.0 cmH2O Set Climate Control to Auto Set Humidifier level to Auto Set Tube temperature to Auto

## 2024-02-10 NOTE — Progress Notes (Signed)
 Patient ID: Edgar Jones, male   DOB: [birth year], 78 y.o.   MRN: [id number]       Primary M.D.: Dr. Marjory Lies  HPI: Edgar Jones is a 78 year old former a patient of Dr. Alanda Amass. He presents for a 4 month follow-up evaluation.   Edgar Jones underwent mitral valve repair or in June 2010 with a 32 mm Edwards annuplasty plasty ring and quadrangular resection of P2 segment of the mitral valve which was done by Dr. Dorris Fetch. 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 At that time he had normal coronary arteries. He has a history of obstructive sleep apnea since 2010 and has been on CPAP therapy and admits to 100% use. There is also a history of hypertension as well as hyperlipidemia in addition to GERD.  When I saw initially saw him in January 2015, he denied any episodes of chest pain. He noted mild episodes intermittently and feels at times she is unable to get a deep breath. An echo Doppler study in April 2014 confirmed normal systolic function with an ejection fraction of 55-60%. Left atrium was mildly dilated. His mitral valve repair and annuloplasty ring were stable there was no evidence for prolapse and only trivial regurgitation. He had mild TR.  Prior to initially seeing him in September 2015 he had experienced 2 transient events associated with transient right hand twitching and weakness as well as another episode of "total "vision.  He he denied any dizziness or he saw Dr. Lesia Sago.  Carotid studies did not demonstrate any significant abnormality.  An MRI of his brain did not reveal any acute intracranial abnormality.  There was expected evolution of the left occipital lobe hemorrhage and small right parietal cortex infarct, which previously had been detected in 2010.  There was evidence for mild interval increase microhemorrhages in both cerebral hemispheres.  A repeat echo Doppler study was also done in June 2015,  which showed an ejection fraction in the range of 60-65%.  There was probable to diastolic dysfunction.  There is trivial aortic regurgitation.  His mitral valve repair was intact with moderately thickened leaflets.  His atrium was dilated.  His right ventricle was mildly dilated.  It was felt by Dr. Anne Hahn that he possibly he may have had a TIA, but this was not definitive.  The possibility of a future loop recorder was discussed if the patient had recurrent symptomatology.  The patient denies any recurrent symptoms.  He denies any awareness of any palpitations.  He denies chest pain.  He denies fever or shortness of breath.  He has a history of hypertension and has hyperlipidemia on simvastatin 40 mg.  There is a history of GERD for which she is on Prilosec.  He also has obstructive sleep apnea and continues to use CPAP with 100% compliance.   When seen in September 2016 he continued to be stable and denied any chest pain and had experienced mild occasional shortness of breath, not typically exertionally precipitated.  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  He has a history of GERD but this has been controlled with omeprazole.  He has been taking simvastatin 40 mg daily for hyperlipidemia. He has been on been  omeprazole 20 mg daily for high blood pressure.  Several years ago his triglycerides were elevated and omega-3 fatty acid was added to his lipid lowering  therapy.    On 01/17/2016 he underwent a follow-up echo Doppler study. This showed an EF of 60-65% with mild LVH. There was very mild aortic stenosis with a mean gradient of 11 mm. Aortic root measured 38 mm. He had severe left atrial dilatation and moderate right atrial dilatation..  Pulmonary pressures were normal with a PA pressure at 27 mm.  His mitral valve repair was stable without significant stenosis.   He continues to drive a [name] and needs certified driving license. Laboratory by his  primary in January 2018: total cholesterol 202, triglycerides 225, HDL 39, and LDL 136 with a non-HDL of 409.  When I last saw him, I discontinued simvastatin, and in its place started rosuvastatin 20 mg.  He underwent a one-year follow-up echo Doppler study on 02/13/2017.  He has normal systolic function without regional wall motion abnormalities.  There was trivial aortic insufficiency.  His mitral annular ring prosthesis was present and function normally.  There was no Edgar.  He had severe LA dilatation and mild right a dilatation.  Subsequent  laboratory in May 2018 showed improvement in lipids, such that total cholesterol is now 149, triglycerides 130, HDL 42, and LDL 81.  Dates.  He still has been having red meat 2-3 times per week, which is usually a clean cut.  He also has fish and chicken.  LFTs were normal.    I saw him in May 2019 and over the prior year he had continued to be stable.  He specifically denied any  chest pain or shortness of breath.  He denied any significant palpitation.  He continued to use CPAP with 100% compliance and is followed at [facility].  He continues to drive a truck.  He's asymptomatic with reference to dizziness, presyncope or syncope.  He had undergone a follow-up neurologic evaluation in April 2019 and was stable.  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  He was evaluated by Edd Fabian, NP in May and in June 2021.  He had been evaluated by his primary care physician in May 2021 and his heart rate was bradycardic in the upper 40s.  His ECG showed sinus rhythm with first-degree AV block with PACs.  He wore a Zio patch monitor which showed predominant rhythm at 67 bpm with only had some low heart rates with the lowest heart rate at 39 bpm which occurred while sleeping at 3:53 AM.  There also were  occasional very short bursts of SVT.  He saw Edd Fabian in follow-up.  His last echo Doppler study in December 2020 showed an EF of 55 to 60%.  There was mild aortic valve stenosis with a mean gradient of 12 and valve area of 1.9 cm.  I saw him in February 2022.  Time he denied any chest pain, presyncope, or syncope.  He was experiencing occasional palpitations and admitted to decreased energy level.  He continued to use CPAP and received a new machine approximately 3 years ago.  He had been recently started on Zetia to take in addition to his atorvastatin 40 mg with an LDL cholesterol of 117 and also has been taking over-the-counter fish oil.  He has been on benazepril 40 mg daily for hypertension and takes omeprazole for GERD.  I recommended that he have a follow-up echo Doppler study to  reassess systolic and diastolic function as well as aortic valve disease.  His ECG showed sinus rhythm with marked sinus arrhythmia, PVCs, first-degree AV block and a ventricular rate in the 40s.  I suggested he wear a 2-week event monitor.  I saw him on Mar 27, 2021.  He wore a Zio patch monitor from February 10 through January 04, 2021.  The predominant rhythm was sinus rhythm with average rate at 63.  His slowest heart rate was sinus bradycardia at 30 bpm which occurred while sleeping at 1:58 AM on December 30, 2020.  The maximum rate was 154 bpm.  He had 3 episodes of nonsustained VT with the fastest interval of 4 beats at a maximum rate of 154 and the longest lasted 4 beats with an average rate of 123.  He had 22 brief episodes of SVT with the fastest interval lasting 7 beats with a maximum rate of 144 and the longest interval lasting 16 beats with an average rate of 103.  He had frequent PACs, occasional atrial couplets, isolated PVCs with rare couplets and triplets.  He also had ventricular bigeminy.  With his history of episodes I did not start him on daily metoprolol but recommended heat take metoprolol tartrate  12.5 mg to 25 mg on an as-needed basis for palpitations or bursts of SVT.  He denies any chest pain.  His echo Doppler study on January 11, 2021 showed low normal EF at 50 to 55% without wall motion abnormalities.  There was mild LVH.  There was a mitral annuloplasty ring and mild residual mitral regurgitation.  There was mild to moderate aortic sclerosis without stenosis.  He continues to use CPAP.  I obtained a new download from March 14 through February 20, 2021.  Compliance is 100% with average use 5 hours and 46 minutes.  His pressure setting is a range of 5 to 15 cm and his 95th percentile pressure is 11.7 with maximum average pressure 13.1.  AHI is 3.2.  His CPAP set up date was 2015.  As result he has a 3G CPAP unit and since mid April 2022 this was no longer is downloadable wirelessly due to the change to 5G.    I recommended that he have an EP evaluation and on June 22nd, 2022 he saw Dr. 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  His cardiac monitor showed mostly nighttime bradycardia as well as frequent PACs and some PVCs but very brief nonsustained atrial tachycardia.  At that time, his symptoms and findings on his event monitor were reassuring and it was felt that his pacemaker was not necessary and if he did develop worsening symptoms he may be a candidate for a loop recorder.  I saw him on January 01, 2022.  Since his prior evaluation, he continues to drive a dump truck.  He denies any dizziness or lightheadedness.  He is unaware of any significant palpitations.  We were able to obtain a download from his DME company which continue to show excellent compliance from November 1 through December 09, 2021 with average CPAP use of 6 hours and 19 minutes.  AHI is 3.8 and his pressure range is set at 5 to 15 cm of water.  His 95th percentile pressure was 12.0.  His CPAP is followed at [facility].  During that evaluation, he was in atrial flutter and I recommended he see Dr. Ladona Ridgel for  further evaluation and potential additional treatment for consideration of atrial flutter candidacy.  He subsequently  underwent atrial flutter catheter ablation by Dr. Ladona Ridgel on Mar 19, 2022 and saw him in follow-up on July 30, 2022.  He recently was seen by Maxine Glenn for EP evaluation on February 13, 2023.  ECG showed sinus rhythm with frequent PACs versus sinus arrhythmia.  When I saw him on Mar 17, 2023  he continued to feel well.   He is still working.  He denies chest pain or shortness of breath.  He continues to use CPAP.   He is on benazepril 40 mg, and metoprolol tartrate on an as-needed basis.  He is on Zetia 10 mg and atorvastatin 40 mg for hyperlipidemia. He takes baby aspirin and takes omeprazole for GERD.    I last saw him on October 13, 2023.  At that time he continued to feel well and denied any chest pain or shortness of breath.  He has some concerns since his brother at age 58 died of lung cancer in 06-16-2022.  Remotely, Edgar Jones had smoked from age 15 until age 33 but quit smoking at that time.  Old records prior to me seeing him in January 2012 a CT which raised concern for possible mild emphysematous changes affecting both lungs.  Within the right middle lobe there was a small 4 mm small nodule.  He had atelectasis of the right lung base.  It was evidence for multilevel spondylosis.  Presently, he continues to be followed at [facility] which is part of [facility].  He denies any chest pain or significant shortness of breath.  He is on benazepril 40 mg hypertension and has a prescription for metoprolol to tartrate 25 mg to take 1/2 to 1 tablet by mouth as needed for palpitations.  He is on atorvastatin 40 mg and Zetia 10 mg for hyperlipidemia.  He continues to be on baby aspirin.  He is on omeprazole for GERD.    He was seen by Maxine Glenn, PA-C for EP reevaluation on February 09, 2024.  He was maintaining sinus rhythm with marked sinus arrhythmia and heart rate  on average in the 50s.  Since I last saw him, he his primary physician, Dr. Benedetto Goad has retired.  He had been on benazepril 40 mg in addition to metoprolol succinate 12.5 mg daily.  However his resting pulse has been low in the 40s and therefore he stopped taking metoprolol.  He is on atorvastatin 40 mg and Zetia 10 mg for hyperlipidemia.  He takes omeprazole for GERD.  He takes baby aspirin.  He denies chest pain or shortness of breath.  He does admit to some ankle swelling bilaterally.  He continues to use CPAP therapy followed at [facility].  A download from March 2 through February 09, 2024 shows 100% use with average use approximately 6 hours.  His pressure apparently is set at 9 to 15 cm of water and his AHI is 4.5.  95th percentile pressure is 13.2 with maximum average pressure 14.  At his last evaluation I had suggested slight titration of his CPAP and on the slightly increased pressure on 11-16 with increased ramp start at 7 he actually felt much better.  Currently his doctor at [facility] put the pressure back to the old settings and he is not sleeping as well as he had with the slightly higher settings.  He presents for evaluation.   Past Medical History:  Diagnosis Date   Dyslipidemia    Endocarditis    GERD (gastroesophageal reflux disease)  Heart murmur    History of stress test 11/22/2010   Perfusion defect in the inferior myocardial region is consistent with diaphragmatic attenuation. The remaining myocardium demonstrates normal myocardial perfusion with no evidence of ischemia or infarct. The post stress left ventricle is normal in size. The post stress EF 65%, Gobal left ventricle systolic function is normal. No significant wall motion abnormalities noted. Normal Myocardial perfus   Hx of echocardiogram 02/10/2013   The cavity size was normal. Ststolic function was normal. The estimated EF was in the range 55%-60%. Wall motion was normal; there was no regional wall motion abnormalities.  Stable MV repair & annuloplasty ring. Trace Edgar   Hyperlipidemia    Hypertension    OSA on CPAP    SBE (subacute bacterial endocarditis)    Sleep apnea    Stroke [facility])    3/10 endocarditis/embolic    Past Surgical History:  Procedure Laterality Date   A-FLUTTER ABLATION N/A 03/19/2022   Procedure: A-FLUTTER ABLATION;  Surgeon: Marinus Maw, MD;  Location: [facility] INVASIVE CV LAB;  Service: Cardiovascular;  Laterality: N/A;   CARDIAC CATHETERIZATION  01/2009   COLONOSCOPY     HERNIA REPAIR     right abd   INGUINAL HERNIA REPAIR     left side   MITRAL VALVE REPAIR  04/2009   32mm Edwards annuloplasty ring and quadrangular resection pf P2 segment of the motral valve by Dr Dorris Fetch with normal coronary arteries.   POLYPECTOMY     UPPER GASTROINTESTINAL ENDOSCOPY      No Known Allergies  Current Outpatient Medications  Medication Sig Dispense Refill   amoxicillin (AMOXIL) 500 MG capsule Take 2,000 mg by mouth See admin instructions. Take before dentist     aspirin EC 81 MG tablet Take 1 tablet (81 mg total) by mouth daily. Swallow whole. 90 tablet 3   atorvastatin (LIPITOR) 40 MG tablet TAKE 1 TABLET BY MOUTH DAILY 100 tablet 2   benazepril (LOTENSIN) 40 MG tablet TAKE 1 TABLET BY MOUTH DAILY 90 tablet 0   ezetimibe (ZETIA) 10 MG tablet TAKE 1 TABLET BY MOUTH DAILY 100 tablet 2   ferrous sulfate 325 (65 FE) MG tablet Take 325 mg by mouth daily with breakfast.     hydrochlorothiazide (MICROZIDE) 12.5 MG capsule Take 1 capsule (12.5 mg total) by mouth every other day. If ankle swelling persist, take one tablet daily. 90 capsule 3   ibuprofen (ADVIL) 600 MG tablet Take 600 mg by mouth every 6 (six) hours as needed for moderate pain or headache.     Omega-3 Fatty Acids (FISH OIL) 1000 MG CAPS Take 2,000 mg by mouth daily.     omeprazole (PRILOSEC) 40 MG capsule TAKE ONE CAPSULE BY MOUTH EVERY DAY 30 capsule 6   metoprolol succinate (TOPROL XL) 25 MG 24 hr tablet Take 0.5 tablets (12.5 mg  total) by mouth daily. (Patient not taking: Reported on 02/10/2024) 45 tablet 1   No current facility-administered medications for this visit.    Socially he is married has 3 children and 2 grandchildren. There is a remote tobacco history but he quit over 30 years ago. 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 He is able to exercise without restriction. He does play golf and rides a bike.  Family History  Problem Relation Age of Onset   Heart disease Mother    Heart disease Father    Arthritis Father  Cirrhosis Brother    Hypertension Maternal Aunt    Colon polyps Brother    Hypertension Brother    Hyperlipidemia Brother    Colon cancer Neg Hx    Esophageal cancer Neg Hx    Stomach cancer Neg Hx    Rectal cancer Neg Hx    He tells me that his father died with an aortic aneurysm.  His sister had pancreatic cancer.  ROS General: Negative; No fevers, chills, or night sweats;  HEENT: Negative; No changes in vision or hearing, sinus congestion, difficulty swallowing Pulmonary: Negative; No cough, wheezing, shortness of breath, hemoptysis Cardiovascular: see HPI GI: Negative; No nausea, vomiting, diarrhea, or abdominal pain GU: Negative; No dysuria, hematuria, or difficulty voiding Musculoskeletal: Negative; no myalgias, joint pain, or weakness Hematologic/Oncology: Negative; no easy bruising, bleeding Endocrine: Negative; no heat/cold intolerance; no diabetes Neuro: See history of present illness; no changes in balance, headaches Skin: Negative; No rashes or skin lesions Psychiatric: Negative; No behavioral problems, depression Sleep: Positive for sleep apnea on CPAP therapy;  He admits to 100% compliance; No snoring, daytime sleepiness, hypersomnolence, bruxism, restless legs, hypnogognic hallucinations, no cataplexy Other comprehensive 14 point system review is negative.   PE BP 120/70   Pulse (!) 44   Ht 6\' 3"  (1.905 m)    Wt 228 lb (103.4 kg)   SpO2 96%   BMI 28.50 kg/m    Repeat blood pressure by me was 118/70  Wt Readings from Last 3 Encounters:  02/10/24 228 lb (103.4 kg)  02/09/24 227 lb 9.6 oz (103.2 kg)  10/13/23 226 lb 6.4 oz (102.7 kg)   General: Alert, oriented, no distress.  Skin: normal turgor, no rashes, warm and dry HEENT: Normocephalic, atraumatic. Pupils equal round and reactive to light; sclera anicteric; extraocular muscles intact;  Nose without nasal septal hypertrophy Mouth/Parynx benign; Mallinpatti scale 3 Neck: No JVD, no carotid bruits; normal carotid upstroke Lungs: clear to ausculatation and percussion; no wheezing or rales Chest wall: without tenderness to palpitation Heart: PMI not displaced, bradycardic in the 40s, s1 s2 normal, 1/6 systolic murmur, no diastolic murmur, no rubs, gallops, thrills, or heaves Abdomen: soft, nontender; no hepatosplenomehaly, BS+; abdominal aorta nontender and not dilated by palpation. Back: no CVA tenderness Pulses 2+ Musculoskeletal: full range of motion, normal strength, no joint deformities Extremities: no clubbing cyanosis or edema, Homan's sign negative  Neurologic: grossly nonfocal; Cranial nerves grossly wnl Psychologic: Normal mood and affect    October 13, 2023 ECG (independently read by me): Sinus rhythm with sinus arrhythmia at 67 bpm.  First-degree AV block.  Nonspecific ST segment abnormalities.   Mar 17, 2023 ECG (independently read by me): Sinus rhythm at 62 with sinus arrythmia, PACs, 1st degree AV block  January 01, 2022 ECG (independently read by me):  Atrial flutter with variable block at 51  Mar 27, 2021 ECG (independently read by me): Sinus bradycarda st 48, marked sinus arrythmia with 1.9 second pause;  1st degree AV block, PR 214  February 2022 ECG (independently read by me): Sinus rhythm with marked sinus arrhythmia, PACs, ventricular rate averaging 46 bpm. First-degree AV block with a PR interval at 220 ms.  Inferolateral T wave abnormality.  May 2017 ECG (independently read by me): Sinus bradycardia at 48 bpm with mild sinus arrhythmia and first-degree AV block.  Isolated PAC.  PR interval 232 ms.  QTc interval 394 ms.  May 2018 ECG (independently read by me): Normal sinus rhythm at 60 bpm.  First degree AV block.  PAC.  PR interval 214 ms per QTc interval 394 ms.  March 2018 ECG (independently read by me): Normal sinus rhythm at 63 bpm with sinus arrhythmia, first-degree AV block, nonspecific T changes.  March 2017 ECG (independently read by me):  Sinus bradycardia 50 bpm with first-degree block.  No ectopy. Mild T wave abnormality inferiorly.  September 2016 ECG (independently read by me): Sinus bradycardia 53 bpm.  First-degree AV block with a PR interval at 224 ms.  ECG (independently read by me): Normal sinus rhythm at 63 bpm.  Occasional PVC.  First-degree AV block with a PR interval at 212.  September 2015 ECG (independently read by me): Sinus bradycardia 51 beats per minute.  Borderline first-degree V. block with a PR interval of 208 ms.  QTc interval 411 milliseconds  November 23, 2013 ECG (independently read by me): Sinus bradycardia with very mild first-degree block with a PR interval of 218 ms. No significant ST changes.  LABS:     Latest Ref Rng & Units 10/14/2023    8:47 AM 03/26/2023    2:09 PM 02/18/2022    3:32 PM  BMP  Glucose 70 - 99 mg/dL 88  83  87   BUN 8 - 27 mg/dL 15  12  12    Creatinine 0.76 - 1.27 mg/dL 1.61  0.96  0.45   BUN/Creat Ratio 10 - 24 13  11  11    Sodium 134 - 144 mmol/L 139  140  144   Potassium 3.5 - 5.2 mmol/L 4.6  4.6  4.6   Chloride 96 - 106 mmol/L 101  104  108   CO2 20 - 29 mmol/L 23  21  22    Calcium 8.6 - 10.2 mg/dL 9.6  9.2  9.1       Latest Ref Rng & Units 10/14/2023    8:47 AM 03/26/2023    2:09 PM 01/11/2022   10:57 AM  Hepatic Function  Total Protein 6.0 - 8.5 g/dL 6.9  6.7  6.9   Albumin 3.8 - 4.8 g/dL 4.4  4.3  4.6   AST 0 - 40  IU/L 24  21  17    ALT 0 - 44 IU/L 23  23  17    Alk Phosphatase 44 - 121 IU/L 51  52  50   Total Bilirubin 0.0 - 1.2 mg/dL 0.7  0.5  0.9       Latest Ref Rng & Units 03/26/2023    2:09 PM 02/18/2022    3:32 PM 01/18/2022   11:11 AM  CBC  WBC 3.4 - 10.8 x10E3/uL 6.5  6.8  5.3   Hemoglobin 13.0 - 17.7 g/dL 40.9  81.1  91.4   Hematocrit 37.5 - 51.0 % 44.2  43.9  44.2   Platelets 150 - 450 x10E3/uL 122  109  124    Lab Results  Component Value Date   MCV 92 03/26/2023   MCV 92 02/18/2022   MCV 92 01/18/2022   Lab Results  Component Value Date   TSH 1.530 03/26/2023   Lab Results  Component Value Date   HGBA1C  05/08/2009    4.6 (NOTE) The ADA recommends the following therapeutic goal for glycemic control related to Hgb A1c measurement: Goal of therapy: <6.5 Hgb A1c  Reference: American Diabetes Association: Clinical Practice Recommendations 2010, Diabetes Care, 2010, 33: (Suppl  1).   BNP No results found for: "PROBNP"  Lipid Panel     Component Value Date/Time   CHOL 150 10/14/2023  0847   TRIG 125 10/14/2023 0847   HDL 44 10/14/2023 0847   CHOLHDL 3.4 10/14/2023 0847   CHOLHDL 3.5 03/28/2017 0925   VLDL 26 03/28/2017 0925   LDLCALC 84 10/14/2023 0847     RADIOLOGY: No results found.  IMPRESSION:  1. Essential hypertension   2. Sinus bradycardia   3. Premature atrial contractions   4. Sinus arrhythmia   5. Atrial flutter, unspecified type (HCC)   6. S/P ablation of atrial flutter   7. S/P MVR (mitral valve repair)   8. OSA on CPAP   9. Hyperlipidemia LDL goal <70      ASSESSMENT AND PLAN: Edgar Jones is a 78 year-old gentleman who has a remote history of SBE and mitral valve endocarditis for which he required mitral valve repair surgery with quadrangular resection of his P2 segment of the mitral valve at which time a 32 mm Edwards annuloplasty ring was inserted in 2010.  An echo Doppler study in March 2017 showed stable mitral valve repair without  stenosis or regurgitation.  He had normal systolic function with mild LVH.  There was evidence for mild aortic stenosis with a mean gradient of 11.  There was evidence for biatrial enlargement.  When I saw him in March 2018, he was hypertensive and I further titrated benazapril from 20 ro 40 mg. An echo Doppler study from 02/13/2017 continued to show a ejection fraction at 55-60%.  There was trivial aortic insufficiency.  His mitral valve annuloplasty ring was present and function normally.  There was no Edgar.  He had biatrial enlargement.  He wore a 2-week monitor in May 2021 after being found to have a slow heart rate.  His predominant rhythm was sinus rhythm.  The slowest heart rate was sinus bradycardia at 39 which occurred while sleeping and he was also noticed to have short bursts of SVT with the fastest interval lasting 5 beats with a maximum rate of 164 and the longest interval lasting 18 beats with an average rate at 118.  He had remotely been evaluated by Dr. Ladona Ridgel for bradycardia.  At his last evaluation with me in February 2023 he was in atrial flutter with variable block.  He subsequently underwent successful atrial flutter ablation on Mar 19, 2022 by Dr. Ladona Ridgel.  When seen in December 2024, he had heart rate irregularity and sinus rhythm with significant sinus arrhythmia.  I suggested initiating very low-dose metoprolol succinate at 12.5 mg which would be helpful both for more optimal blood pressure control, as well as heart rate stability.  Will also keep his metoprolol tartrate to take on an as needed basis for palpitations.  With his brother developing lung cancer, he had concerns regarding his chance of developing lung CA.  I was able to review a remote chest CT from 2012.  Presently I am recommending he undergo a follow-up chest CT for reassessment of his previously noted mild suggestion of emphysematous changes and to further evaluate any potential lung nodule.  He continues to be on atorvastatin  and Zetia for hyperlipidemia..  Recent lipid panel on Mar 26, 2023 showed total cholesterol 141, HDL 45, LDL 76 and triglycerides 109.  He is not fasting today.  I recommended he return in the fasting state to have follow-up c-Met, lipid panel and I will also check LP(a).  LP(a) is normal at 13.7.  Presently, no longer taking metoprolol due to slow heart rate.  He feels well but does admit to bilateral ankle swelling.  I have suggested HCTZ 12.5 mg to take every other day or as needed for ankle swelling.  He continues to be on Zetia and atorvastatin for hyperlipidemia he continues to be followed at [facility] since Dr. Merlyn Albert Wilson's retirement.  I reviewed his most recent CPAP.  He states he felt better after his last office visit when I had made slight adjustments to his pressures.  Apparently his primary put his pressures back and he is not sleeping as well.  I again suggested slight modification and have again recommended increasing ramp start to 7 from 6 and a set of a pressure range of 9-15 I will change to 10-16.  (Previously I had changed to 11 to 16 cm).  I have recommended he undergo a follow-up echo Doppler study in January 2026.  I discussed with him my upcoming retirement.  I will transition him to the care of Dr. Epifanio Lesches with follow-up evaluation after his echo Doppler study.   Lennette Bihari, MD, [facility], ABSM Diplomate, [employer] Board of Sleep Medicine  02/15/2024 12:31 PM

## 2024-02-15 ENCOUNTER — Encounter: Payer: Self-pay | Admitting: Cardiovascular Disease

## 2024-03-23 ENCOUNTER — Other Ambulatory Visit: Payer: Self-pay | Admitting: Cardiovascular Disease

## 2024-09-06 ENCOUNTER — Other Ambulatory Visit: Payer: Self-pay

## 2024-09-07 MED ORDER — ATORVASTATIN CALCIUM 40 MG PO TABS: 40.0000 mg | ORAL_TABLET | Freq: Every day | ORAL | 2 refills | Status: AC

## 2024-11-30 ENCOUNTER — Ambulatory Visit

## 2024-11-30 DIAGNOSIS — Z9889 Other specified postprocedural states: Secondary | ICD-10-CM

## 2024-11-30 DIAGNOSIS — I4892 Unspecified atrial flutter: Secondary | ICD-10-CM

## 2024-11-30 DIAGNOSIS — I10 Essential (primary) hypertension: Secondary | ICD-10-CM

## 2024-11-30 DIAGNOSIS — I491 Atrial premature depolarization: Secondary | ICD-10-CM

## 2024-11-30 LAB — ECHOCARDIOGRAM COMPLETE
AR max vel: 1.78 cm2
AV Area VTI: 2.27 cm2
AV Area mean vel: 2.13 cm2
AV Mean grad: 13 mmHg
AV Peak grad: 25 mmHg
Ao pk vel: 2.5 m/s
Area-P 1/2: 2.81 cm2
MV VTI: 1.4 cm2
S' Lateral: 4.24 cm

## 2024-12-01 ENCOUNTER — Ambulatory Visit: Payer: Self-pay | Admitting: Cardiology

## 2024-12-13 ENCOUNTER — Other Ambulatory Visit: Payer: Self-pay | Admitting: General Practice

## 2024-12-16 MED ORDER — BENAZEPRIL HCL 40 MG PO TABS: 40.0000 mg | ORAL_TABLET | Freq: Every day | ORAL | 0 refills | Status: AC

## 2024-12-16 NOTE — Telephone Encounter (Signed)
 In accordance with refill protocols, please review and address the following requirements before this medication refill can be authorized:  Labs  Pt needs labs within 365 days within normal range for Cr and K. Pt has an upcoming appt with Dr. Kate on 01/18/2025

## 2025-01-18 ENCOUNTER — Ambulatory Visit: Admitting: Cardiology

## 2025-02-03 ENCOUNTER — Ambulatory Visit: Admitting: Student
